# Patient Record
Sex: Female | Born: 1965
Health system: Southern US, Community
[De-identification: ages and names within clinical notes are randomized; demographics above are authoritative.]

## PROBLEM LIST (undated history)

## (undated) DIAGNOSIS — K5010 Crohn's disease of large intestine without complications: Secondary | ICD-10-CM

## (undated) DIAGNOSIS — I10 Essential (primary) hypertension: Secondary | ICD-10-CM

## (undated) DIAGNOSIS — K219 Gastro-esophageal reflux disease without esophagitis: Secondary | ICD-10-CM

## (undated) DIAGNOSIS — M810 Age-related osteoporosis without current pathological fracture: Secondary | ICD-10-CM

## (undated) DIAGNOSIS — D126 Benign neoplasm of colon, unspecified: Secondary | ICD-10-CM

## (undated) DIAGNOSIS — M1990 Unspecified osteoarthritis, unspecified site: Secondary | ICD-10-CM

## (undated) DIAGNOSIS — D649 Anemia, unspecified: Secondary | ICD-10-CM

## (undated) DIAGNOSIS — D869 Sarcoidosis, unspecified: Secondary | ICD-10-CM

## (undated) DIAGNOSIS — K753 Granulomatous hepatitis, not elsewhere classified: Secondary | ICD-10-CM

## (undated) DIAGNOSIS — Z86010 Personal history of colonic polyps: Secondary | ICD-10-CM

## (undated) DIAGNOSIS — K589 Irritable bowel syndrome without diarrhea: Secondary | ICD-10-CM

## (undated) DIAGNOSIS — E663 Overweight: Secondary | ICD-10-CM

## (undated) HISTORY — PX: POLYPECTOMY: SHX149

## (undated) HISTORY — DX: Unspecified osteoarthritis, unspecified site: M19.90

## (undated) HISTORY — DX: Essential (primary) hypertension: I10

## (undated) HISTORY — DX: Age-related osteoporosis without current pathological fracture: M81.0

## (undated) HISTORY — PX: COLONOSCOPY: SHX174

## (undated) HISTORY — DX: Benign neoplasm of colon, unspecified: D12.6

## (undated) HISTORY — PX: KNEE SURGERY: SHX244

## (undated) HISTORY — DX: Anemia, unspecified: D64.9

## (undated) HISTORY — DX: Gastro-esophageal reflux disease without esophagitis: K21.9

## (undated) HISTORY — DX: Overweight: E66.3

## (undated) HISTORY — DX: Granulomatous hepatitis, not elsewhere classified: K75.3

## (undated) HISTORY — DX: Crohn's disease of large intestine without complications: K50.10

## (undated) HISTORY — DX: Personal history of colonic polyps: Z86.010

## (undated) HISTORY — DX: Irritable bowel syndrome, unspecified: K58.9

---
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
## 1998-06-27 HISTORY — PX: OTHER SURGICAL HISTORY: SHX169

## 2006-10-13 HISTORY — PX: SPINE SURGERY: SHX786

## 2007-01-22 ENCOUNTER — Ambulatory Visit (HOSPITAL_BASED_OUTPATIENT_CLINIC_OR_DEPARTMENT_OTHER): Admission: RE | Admit: 2007-01-22 | Discharge: 2007-01-22 | Payer: Self-pay | Admitting: Orthopedic Surgery

## 2007-05-29 ENCOUNTER — Encounter: Admission: RE | Admit: 2007-05-29 | Discharge: 2007-05-29 | Payer: Self-pay | Admitting: Orthopedic Surgery

## 2007-07-04 ENCOUNTER — Encounter (INDEPENDENT_AMBULATORY_CARE_PROVIDER_SITE_OTHER): Payer: Self-pay | Admitting: Orthopedic Surgery

## 2007-07-04 ENCOUNTER — Inpatient Hospital Stay (HOSPITAL_COMMUNITY): Admission: RE | Admit: 2007-07-04 | Discharge: 2007-07-10 | Payer: Self-pay | Admitting: Orthopedic Surgery

## 2007-10-29 ENCOUNTER — Ambulatory Visit (HOSPITAL_BASED_OUTPATIENT_CLINIC_OR_DEPARTMENT_OTHER): Admission: RE | Admit: 2007-10-29 | Discharge: 2007-10-29 | Payer: Self-pay | Admitting: Orthopedic Surgery

## 2007-12-22 ENCOUNTER — Encounter: Admission: RE | Admit: 2007-12-22 | Discharge: 2007-12-22 | Payer: Self-pay | Admitting: Neurology

## 2008-08-14 ENCOUNTER — Encounter (INDEPENDENT_AMBULATORY_CARE_PROVIDER_SITE_OTHER): Payer: Self-pay | Admitting: Obstetrics and Gynecology

## 2008-08-14 ENCOUNTER — Ambulatory Visit (HOSPITAL_COMMUNITY): Admission: RE | Admit: 2008-08-14 | Discharge: 2008-08-15 | Payer: Self-pay | Admitting: Obstetrics and Gynecology

## 2009-03-15 IMAGING — CR DG CHEST 2V
2 series · 2 of 2 positions shown · non-contrast
Comparison: None.

CLINICAL DATA: Preop for lumbar spondylosis.
 CHEST - 2 VIEW:

[view not recorded (1 of 2)]
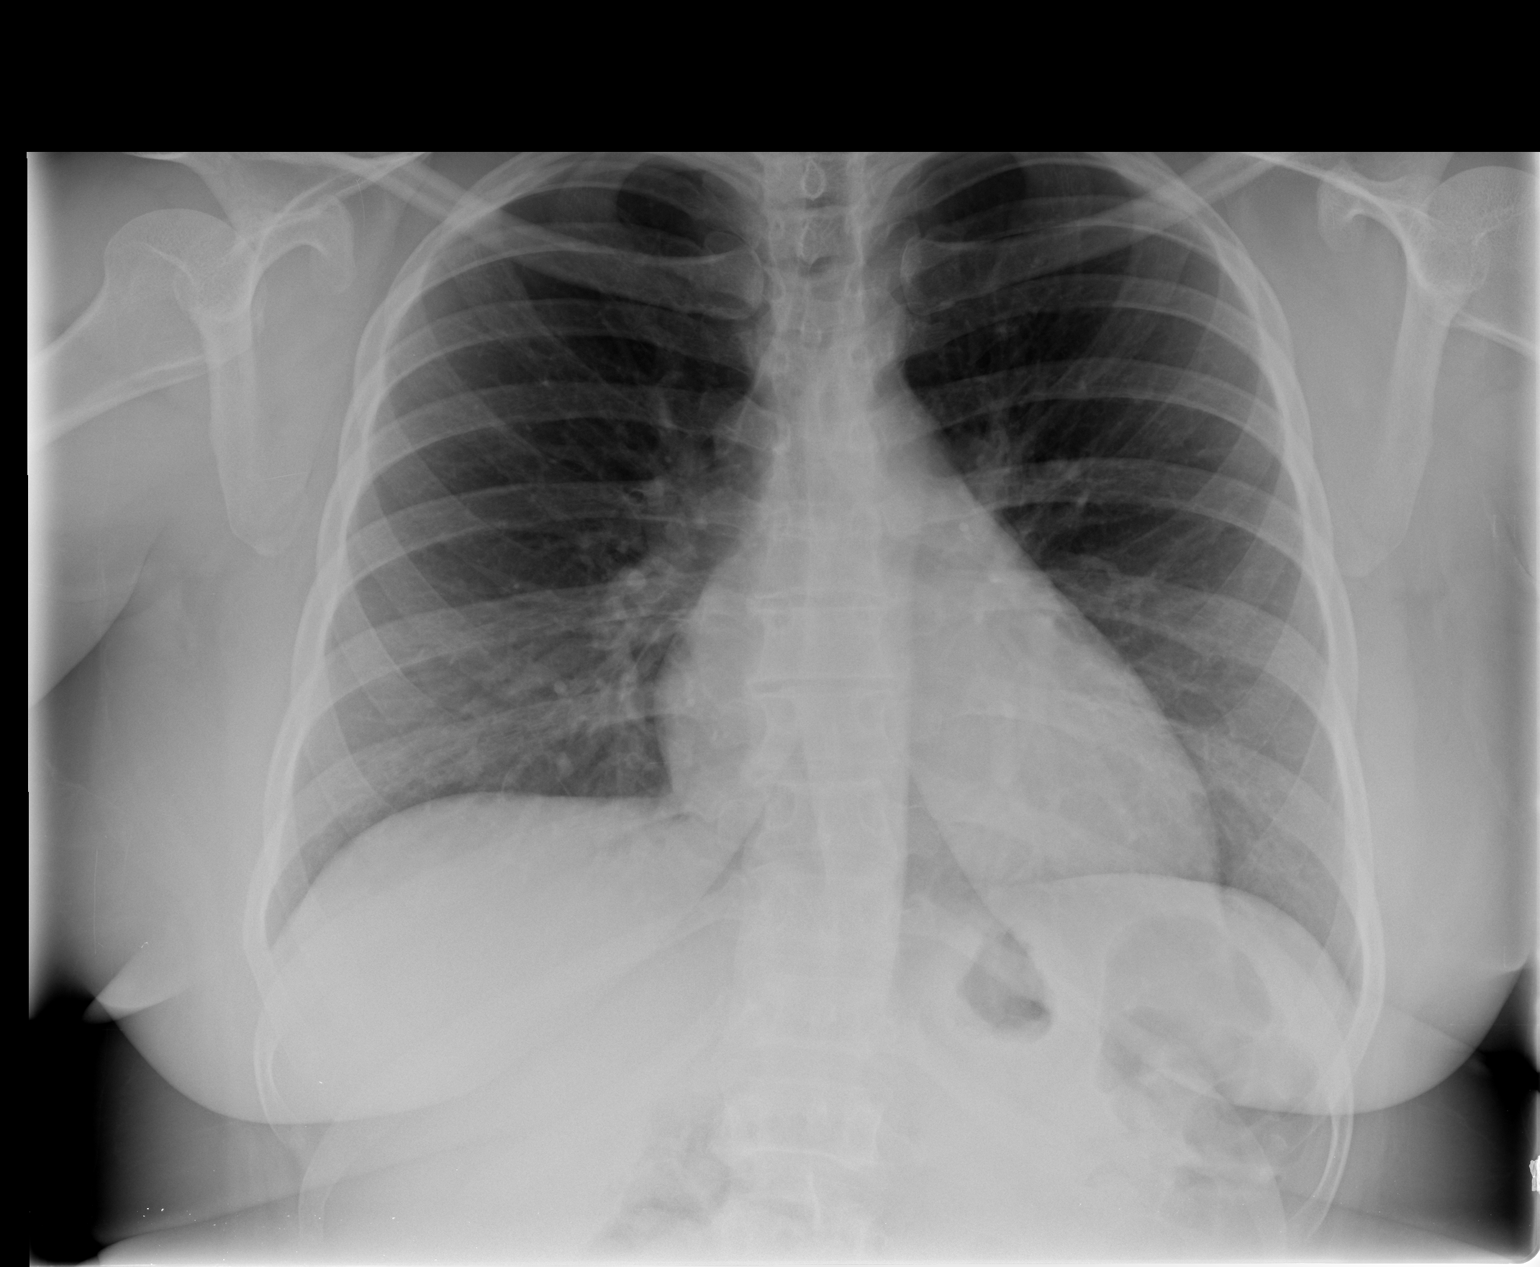

[view not recorded (2 of 2)]
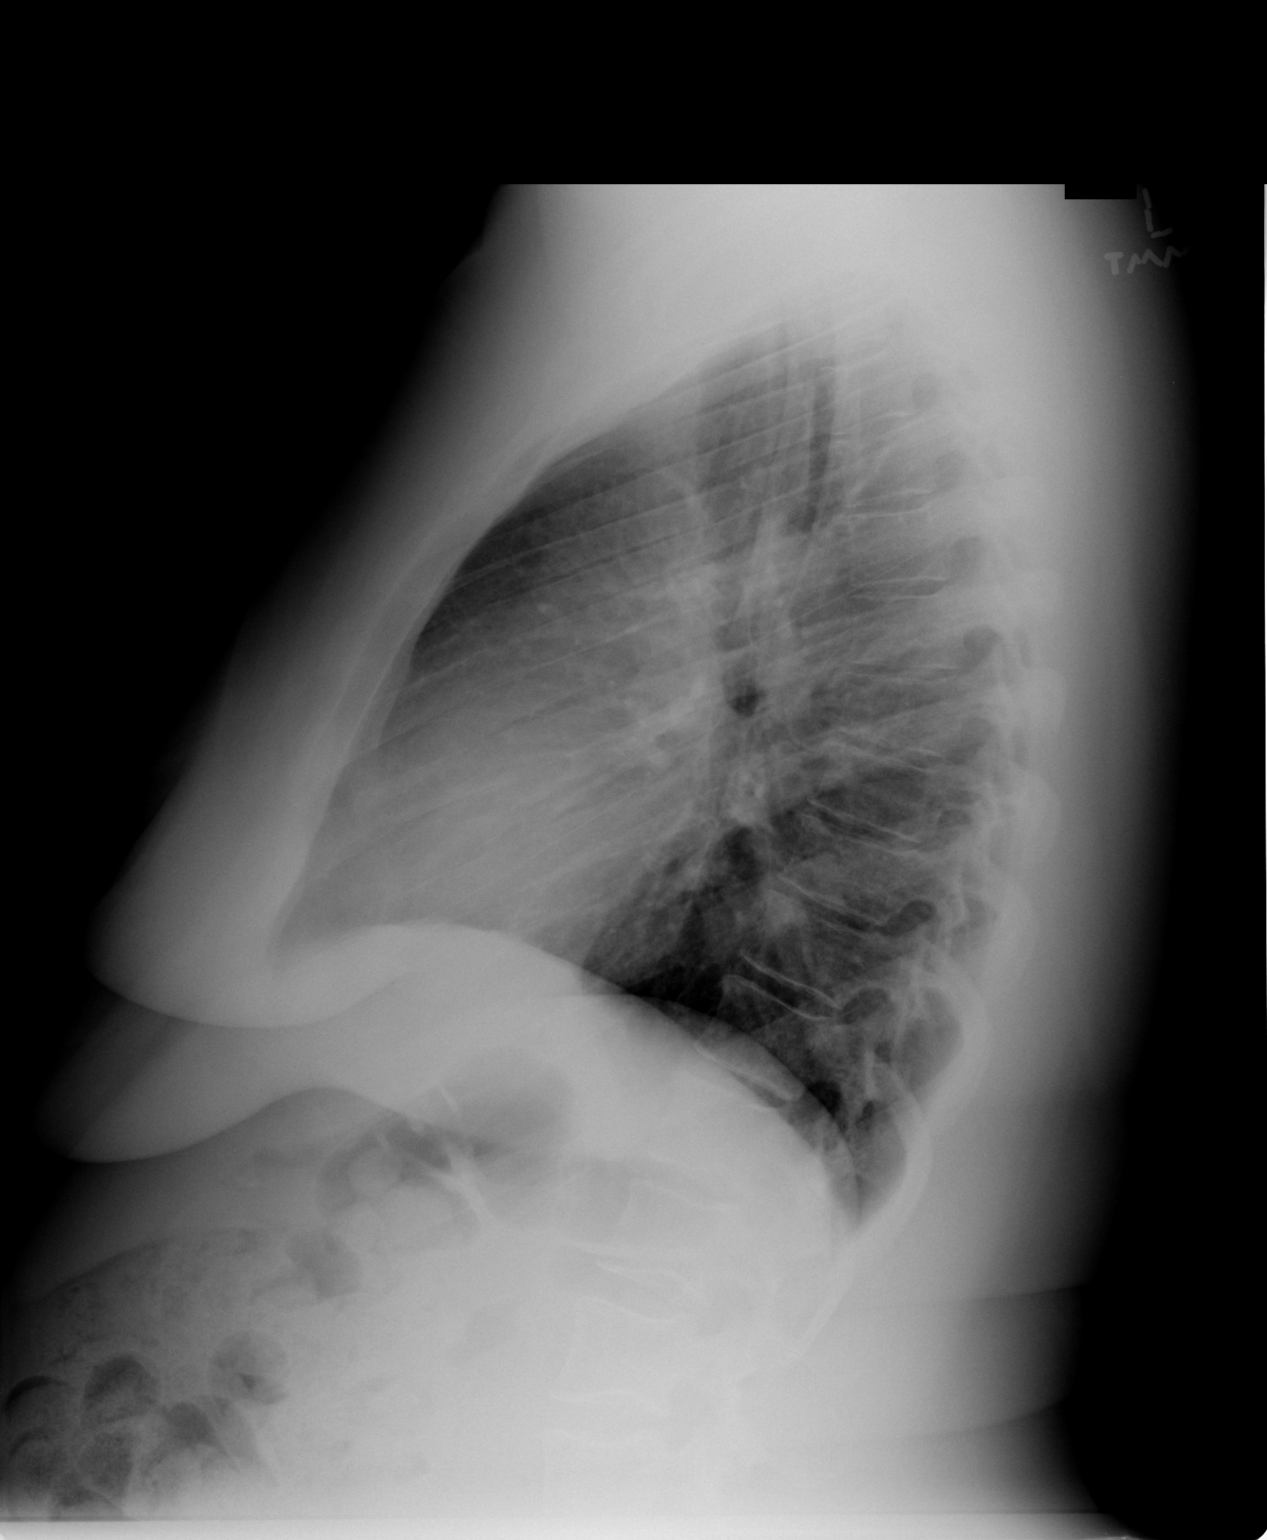

[2 of 2 positions shown; findings below may reference images not displayed]

FINDINGS: Heart size and mediastinal contours are normal. 
 There is no pleural fluid or pulmonary edema.
 No airspace opacities are identified.
IMPRESSION: No active disease.

## 2009-06-27 HISTORY — PX: WRIST FRACTURE SURGERY: SHX121

## 2009-06-27 LAB — HM PAP SMEAR: HM Pap smear: NORMAL

## 2009-11-05 ENCOUNTER — Encounter: Admission: RE | Admit: 2009-11-05 | Discharge: 2009-11-05 | Payer: Self-pay | Admitting: Specialist

## 2010-04-28 ENCOUNTER — Ambulatory Visit (HOSPITAL_BASED_OUTPATIENT_CLINIC_OR_DEPARTMENT_OTHER): Admission: RE | Admit: 2010-04-28 | Discharge: 2010-04-28 | Payer: Self-pay | Admitting: Orthopedic Surgery

## 2010-08-16 NOTE — Op Note (Signed)
NAMEJANSEN, [name]               ACCOUNT NO.:  [id number]  MEDICAL RECORD NO.:  [id number]          PATIENT TYPE:  AMB  LOCATION:  DSC                          FACILITY:  [facility]  PHYSICIAN:  Cindee Salt, M.D.       DATE OF BIRTH:  24-Mar-1966  DATE OF PROCEDURE:  04/28/2010 DATE OF DISCHARGE:                              OPERATIVE REPORT   PREOPERATIVE DIAGNOSIS:  Ulnocarpal abutment, left wrist.  POSTOPERATIVE DIAGNOSIS:  Ulnocarpal abutment, left wrist.  OPERATION:  Arthroscopy, debridement of LT tear with ulnar shortening osteotomy, left wrist.  SURGEON:  Cindee Salt, MD  ASSISTANTCarolyne Fiscal, RN  ANESTHESIA:  Axillary general.  ANESTHESIOLOGIST:  Janetta Hora. Gelene Mink, MD  HISTORY:  The patient is a 46 year old female with a long history of ulnar-sided wrist pain secondary to fall.  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  This has settle down to a positive MRI with changes on the ulnar aspect of her lunate only and LT tear.  TFCC is intact, but the x-rays and MRI findings are very consistent with ulnocarpal abutment.  She has elected to undergo ulnar shortening osteotomy after wrist arthroscopy.  Pre, peri and postoperative course have been discussed along with risks and complications.  She is aware that there is no guarantee with the surgery, possibility of infection, recurrence of injury to arteries, nerves, tendons, incomplete relief of symptoms, and dystrophy.  In the preoperative area, the patient is seen, the extremity marked by both the patient and surgeon, and antibiotic given.  PROCEDURE:  The patient was brought to the operating room where an axillary block was carried out without difficulty along with general anesthetic under the direction of Dr. Gelene Mink.  She was prepped using DuraPrep, supine position, left arm free.  A 3-minute dry time was allowed.  Time-out taken confirming the patient and procedure.   The patient's limb was placed in the concepts arthroscopy tower, 10 pounds of traction applied.  The joint inflated to 3-4 portal.  A transverse incision was made, deepened with hemostat.  Blunt trocar was used to enter the joint.  The joint was inspected.  The volar radial wrist ligaments were intact.  The scapholunate ligament was intact.  There were no significant articular changes.  The TFCC was noted, this had a very significant area of thinning in the central aspect, but was left intact.  The ulnar head was visible through the thinned TFCC.  A large lunotriquetral ligament injury was noted.  An irrigation catheter was placed in the 6U.  A 4-5 portal opened, the LT injury was probed.  This was then inspected through the 4-5 portal, found to be substantial in nature.  The midcarpal joint was then inspected through the radial midcarpal portal after localization with a 22-gauge needle.  Transverse incision was made, deepened with hemostat.  Blunt trocar used to enter the joint.  The joint was inspected.  STT joint showed no changes.  A type 2 lunate was present with some erosive changes, which were very minimal on the proximal hamate.  Lunotriquetral joint showed instability being present.  There  was no instability of scapholunate ligament complex.  The scope was reintroduced into the proximal 3-4 portal.  A debridement of the LT joint ligament was then performed with a full radius shaver.  An Merton Border wand was used for hemostasis.  The TFCC was left intact.  The arthroscopy instruments were removed.  The limb was exsanguinated with an Esmarch bandage.  Tourniquet was placed high and the arm was inflated to 250 mmHg.  An incision was then made to the ulnar aspect of the wrist, carried down through subcutaneous tissue. Dorsal sensory branch of the ulnar nerve identified and protected.  The dissection was then carried between the extensor digiti quinti and extensor carpi ulnaris down to the  ulna.  Periosteum was incised.  This was then elevated.  A seven-hole 2.4 modular plate was then selected. This was placed distally and attached with three 12-mm screws.  The rotation was marked.  Area for osteotomy was marked.  This was marked as a step-cut osteotomy, to be converted to a single hole crossing screw with three screws being placed proximally.  The cuts were made with an oscillating saw taking care not to burn the bone, 2-mm stacked saw blades were placed and the osteotomy performed in a stepped manner.  The plate was reapproximated.  The osteotomy was completed.  The plate reapplied distally.  The bone clamped under compression.  The three proximal screws were placed, these measured 11, 12, and 12 mm.  A 10-mm screw was then passed across the longitudinal portion of the osteotomy, firmly compressing this.  Trinity bone was soaked after irrigation. This was then packed into the osteotomy site as much as possible and around the osteotomy site.  The periosteum was then closed with figure- of-eight 4-0 Vicryl sutures, the fascia closed with interrupted 4-0 Vicryl, the subcutaneous tissue with interrupted 4-0 Vicryl and the skin with a subcuticular 4-0 Vicryl Rapide suture.  The portals for the arthroscopy were closed with interrupted 4-0 Vicryl Rapide.  A sterile compressive dressing, long-arm splint was then applied.  Full pronation and supination was afforded.  X-rays revealed that the osteotomy firmly decompressed the distal radiocarpal joint, fraction ulnocarpal joint with good pronation and supination on physical exam to complete 80 degrees in each direction.  The screws and plate lied in good position. On deflation of the tourniquet, all fingers were immediately pinked after application of the long-arm splint and she was taken to the recovery room for observation in satisfactory condition.  She will be admitted for overnight stay, discharged if comfortable.  She will  return in 1 week.  She is given a prescription for Vicodin #40 with no refills.          ______________________________ Cindee Salt, M.D.     GK/MEDQ  D:  04/28/2010  T:  04/29/2010  Job:  [id number]  Electronically Signed by Cindee Salt M.D. on 08/16/2010 12:20:29 PM

## 2010-09-07 LAB — POCT HEMOGLOBIN-HEMACUE: Hemoglobin: 10.4 g/dL — ABNORMAL LOW (ref 12.0–15.0)

## 2010-10-12 LAB — CBC
HCT: 35.3 % — ABNORMAL LOW (ref 36.0–46.0)
Hemoglobin: 11.8 g/dL — ABNORMAL LOW (ref 12.0–15.0)
MCV: 88.3 fL (ref 78.0–100.0)
Platelets: 375 10*3/uL (ref 150–400)
Platelets: 452 10*3/uL — ABNORMAL HIGH (ref 150–400)
RBC: 3.46 MIL/uL — ABNORMAL LOW (ref 3.87–5.11)
RBC: 4 MIL/uL (ref 3.87–5.11)
RDW: 13 % (ref 11.5–15.5)
WBC: 8.4 10*3/uL (ref 4.0–10.5)

## 2010-10-12 LAB — PREGNANCY, URINE: Preg Test, Ur: NEGATIVE

## 2010-11-09 NOTE — Discharge Summary (Signed)
NAMECALISTA, [name]               ACCOUNT NO.:  [id number]   MEDICAL RECORD NO.:  [id number]          PATIENT TYPE:  INP   LOCATION:  5020                         FACILITY:  [facility]   PHYSICIAN:  Lianne Cure, P.A.  DATE OF BIRTH:  1966-05-09   DATE OF ADMISSION:  07/04/2007  DATE OF DISCHARGE:                               DISCHARGE SUMMARY   PREOPERATIVE DIAGNOSIS:  Lumbar disk degeneration, disk disruption L5-  S1, grade 1 isthmic spondylolisthesis L5-S1, annular tear L4-5 disk.   Postoperatively she was admitted to [facility], Room 5020.  Minimal blood loss. __________ Postoperative no complications.  Postop  day 1 the patient was having drainage output of 250 mL, urine output was  450.  White count 13.5, hemoglobin 10.1, no shortness of breath and no  chest pain.  Dressing was clean, dry, and intact.  Physical therapy came  for evaluation for mobility.  Postop day 2 T-max was 100.3, heart rate  115, and blood pressure of 115/69.  Some right thigh pain, decreased  mobility, generalized body weakness, and pain, but stable otherwise.  Postop day 3; white count was 12.4, hemoglobin 9.3.  Potassium 3.1, T-  max 100.5.  Still poor mobility.  Discontinued the IV fluids slowly and  PCA.  Ordered 3 bananas a day for low potassium.  Postop day 4  hemoglobin stable at 9.5, white count 11.7.  Afebrile and vital signs  were stable with increase in mobility with therapy assistance.  We did  order a LSO brace for the patient and today she reports that the brace  does help her feel more stable.  She walked independently to the  restroom as well as out in the hall.  She has a roller walker for home  and does have a 3 in 1 toilet seat at her home that was ordered.  She  has stable hemoglobin at 9.6, white count is 9.4.  She is afebrile and  vital signs are stable.  Wound is clean, dry, no dressing needed.   DISCHARGE DIAGNOSES:  Lumbar disk degeneration, disk disruption L5-S1  with  grade 1 isthmic spondylolisthesis at L5-S1, and annular tear at L4-  5 disk status post fusion.   PLAN:  No bending, lifting, stooping, squatting.  Sent her home on her  regular prescription medications and new medications to include Norco  10/325 one to 2 q.4 p.r.n. for pain control, count of 120 with 1 refill,  and Robaxin 500 mg 1-2 q.6 p.r.n. for muscle spasm, count 100 with a  refill, and she is going to follow up in our office in approximately 3  weeks.  She may shower.  No dressing needed.  We want her to continue to  eat potassium rich foods to include bananas, treat constipation with  over-the-counter products such as Colace, Senokot, and activity.  Increasing walking activity as tolerated.      Lianne Cure, P.A.     MC/MEDQ  D:  07/10/2007  T:  07/10/2007  Job:  [id number]

## 2010-11-09 NOTE — H&P (Signed)
[name], [name]               ACCOUNT NO.:  [id number]   MEDICAL RECORD NO.:  [id number]          PATIENT TYPE:  AMB   LOCATION:                                [facility]   PHYSICIAN:  Hal Morales, M.D.DATE OF BIRTH:  1966-01-13   DATE OF ADMISSION:  08/14/2008  DATE OF DISCHARGE:                              HISTORY & PHYSICAL   HISTORY OF PRESENT ILLNESS:  Ms. Buzan is a 45 year old separated black  female, para 2-0-1-2, presenting for a laparoscopically-assisted vaginal  hysterectomy because of menometrorrhagia, pelvic pain, and dysmenorrhea.  Over the past year, the patient's menstrual flow has lasted anywhere  from 8-39 days with the past 6 months being complicated by  intermenstrual bleeding.  During these episodes of flow, the patient  will change 6-10 tampons per day and endures severe cramping which she  rates as a 10/10 on a 10-point pain scale.  In spite of the use of  narcotics or nonsteroidal anti-inflammatory analgesia, the patient still  has significant pain.  More recently, the patient's bleeding occurs  without warning and she has the same menstrual-like cramps on an  almost daily basis.  This pain will increase with prolonged standing.  She further reports nocturia (5 episodes per night), postvoid urgency,  urge incontinence requiring her to wear 3 pads per day and dyspareunia.  She denies any fever, nausea, vomiting, diarrhea, or vaginitis symptoms.  In November 2009, the patient had a normal CSH and endometrial biopsy  and she also had a normal CBC.  A pelvic ultrasound in November 2009  revealed a uterus measuring 7.60 x 4.61 x 5.29-cm along with a left  ovary measuring 1.72 x 1.12 x 2.28-cm and a right ovary measuring 2.20 x  1.66 x 2.41-cm.  A review of both, medical and surgical management  options were given to the patient to include hormonal therapy, Mirena  IUD, hysteroscopy with D and C, and hysterectomy with the patient  deciding to proceed  with hysterectomy.   PAST MEDICAL HISTORY:   OB HISTORY:  Gravida 3, para 2-0-1-2.  The patient had spontaneous  vaginal births with her largest baby weighing 7 pounds 6 ounces.   GYN HISTORY:  Menarche, 45 years old.  Last menstrual period on August 06, 2008.  The patient has had tubal ligation for contraception.  Denies  any history of sexually transmitted diseases or abnormal Pap smears.  Last normal Pap smear was in November 2009.   MEDICAL HISTORY:  Migraine headaches, irritable bowel syndrome,  concussion, chronic back pain due to trauma, thyromegaly with benign  nodules, depression, and arthritis.   SURGICAL HISTORY:  In 1999, bilateral tubal ligation.  In 2008 and 2009,  right knee arthroscopy.  In 2009, back surgery fusion (lumbar).  She  denies any problems with anesthesia or history of blood transfusion.   FAMILY HISTORY:  Positive for asthma.   HABITS:  She does not use alcohol, tobacco, or illicit drugs.   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   CURRENT MEDICATIONS:  1. Robaxin 500 mg every 4 hours as needed.  2. Norco 7.5/325 one to two every 4-6 hours as needed.  3. Midrin as needed.  4. Naproxen 500 mg twice daily as needed.   The patient denies any drug allergies or sensitivities to shellfish or  latex.   REVIEW OF SYSTEMS:  The patient does wear glasses.  She admits to  nocturnal vasomotor symptoms, occasional neck spasms, fullness in her  neck causing her sometimes difficulty swallowing believed to be  secondary to her thyromegaly, acid reflux symptoms, but denies any chest  pain, shortness of breath, headache, vision changes, ringing in her  ears, skin rashes, swelling of joints, nausea, vomiting, diarrhea, and  except as is mentioned in history of present illness.  The patient's  review of systems is otherwise negative.   PHYSICAL EXAMINATION:  VITAL SIGNS:   Blood pressure 132/80, pulse is 78,  respirations 12, temperature 98.1 degrees Fahrenheit orally, weight 216  pounds, and height 5 feet 4 inches.  Body mass index 37.1.  NECK:  Supple.  There is no cervical adenopathy.  There is palpable  tenderness over an enlarged thyroid.  HEART:  Regular rate and rhythm.  LUNGS:  Clear.  BACK:  No CVA tenderness.  ABDOMEN:  Tender in lower quadrants, the right being greater than the  left lower quadrant.  However, there is no guarding, rebound or  organomegaly.  EXTREMITIES:  No clubbing, cyanosis or edema.  PELVIC:  EG, BUS is within normal limits.  Vagina is rugous.  Cervix is  nontender without lesions.  Uterus is normal size, shape, and  consistency without tenderness.  Adnexa, there is right adnexal  tenderness though there is no palpable mass.   IMPRESSION:  1. Menometrorrhagia.  2. Pelvic pain.  3. Dysmenorrhea.   DISPOSITION:  A discussion was held with the patient regarding the  indications for her procedure along with its risks which include but are  not limited to reaction to anesthesia, damage to adjacent organs,  infection, excessive bleeding, the possibility that an open abdominal  incision may be necessary to complete her surgery, that she may have to  have one or both of her ovaries removed should they appear diseased and  that this surgery may not relieve her pelvic pain.  The patient  verbalized understanding of these risk and has consented to proceed with  a laparoscopically-assisted vaginal hysterectomy with the possibility of  a total abdominal hysterectomy and the possibility of a unilateral  salpingo-oophorectomy at [facility] on August 14, 2008 at 10:30 a.m.  The patient was given a MiraLax bowel prep to be  completed 24 hours prior to her surgical procedure.      Elmira J. Adline Peals.      Hal Morales, M.D.  Electronically Signed   EJP/MEDQ  D:  08/08/2008  T:  08/09/2008  Job:   [id number]

## 2010-11-09 NOTE — Op Note (Signed)
NAMESELIA, [name]               ACCOUNT NO.:  [id number]   MEDICAL RECORD NO.:  [id number]          PATIENT TYPE:  AMB   LOCATION:  SDC                           FACILITY:  [facility]   PHYSICIAN:  Hal Morales, M.D.DATE OF BIRTH:  May 10, 1966   DATE OF PROCEDURE:  08/14/2008  DATE OF DISCHARGE:                               OPERATIVE REPORT   PREOPERATIVE DIAGNOSES:  1. Menorrhagia.  2. Dysmenorrhea.  3. Pelvic pain.   POSTOPERATIVE DIAGNOSES:  1. Menorrhagia.  2. Dysmenorrhea.  3. Pelvic pain.   PROCEDURE:  Laparoscopy and total vaginal hysterectomy.   SURGEON:  Vanessa P. Pennie Rushing, MD   FIRST ASSISTANT:  Janine Limbo, MD   ANESTHESIA:  General orotracheal.   FINDINGS:  The uterus was normal-sized.  The ovaries appeared normal  bilaterally.  There were no significant adhesions or stigmata of  endometriosis.  The tubes were status post interruption with fallopian  rings for tubal sterilization.   PROCEDURE:  The patient was taken to the operating room after  appropriate identification and placed on the operating table.  After the  attainment of adequate general anesthesia, she was placed in the  modified lithotomy position.  The abdomen, perineum, and vagina were  prepped with multiple layers of Betadine.  A Foley catheter was inserted  into the bladder and connected to straight drainage.  A Hulka tenaculum  was placed on the anterior cervix.  The abdomen and perineum were draped  as a sterile field.  Subumbilical and suprapubic injection of 0.25%  Marcaine for total of 10 mL was undertaken.  A subumbilical incision was  made and a Veress cannula placed through that incision into the  peritoneal cavity.  A pneumoperitoneum was created with 4 liters of CO2.  The Veress cannula was removed and laparoscopic trocar placed through  that incision into the peritoneal cavity.  The laparoscope was placed  through the trocar sleeve.  A suprapubic incision was made to the  left  of midline and the laparoscopic probe trocar placed through that  incision into the peritoneal cavity under direct visualization.  The  above-noted findings were made and in the absence of any specific pelvic  pathology that would preclude a total vaginal hysterectomy, the  laparoscopy was ended and the perineum became the site of operation.  A  weighted speculum was placed in the posterior vagina.  Lahey tenaculum  was placed on the anterior and posterior surfaces of the cervix.  The  cervicovaginal mucosa was injected with a dilute solution of Pitressin  for total of 40 mL.  The cervix was circumscribed.  The anterior vaginal  mucosa was dissected off the anterior cervix.  The anterior peritoneum  was entered and the bladder blade placed.  The posterior peritoneum was  entered sharply and tagged.  The uterosacral ligaments on the right and  left side were clamped, cut, and suture ligated and those sutures held.  The paracervical tissues were clamped, cut, and suture ligated.  The  uterine arteries and parametrial tissues were successively clamped, cut,  and suture ligated.  The uterus was inverted  and the upper pedicle  clamped, cut, and suture ligated doubly and those sutures held.  The  uterus and cervix were removed from the operative field.  McCall  culdoplasty suture was placed in the posterior cul-de-sac incorporating  the 2 uterosacral ligaments and the intervening posterior peritoneum.  Hemostatic sutures were placed on the right and left side of the vaginal  cuff with adequate hemostasis.  The vaginal angles were then created  with the uterosacral sutures placed and into the anterior vaginal mucosa  and posterior vaginal mucosa and the angle tied down.  The remainder of  the vaginal cuff was closed in a single layer incorporating the anterior  vaginal mucosa, anterior peritoneum, posterior peritoneum, and posterior  vaginal mucosa in each figure-of-eight suture.   Hemostasis was noted to  be adequate and the [facility] culdoplasty suture was tied down.  All  sutures were 0 Vicryl.  The hemostasis was noted to be adequate and the  vagina was packed with a 2-inch plain gauze moistened with Estrace  cream.  The surgeon then changed gloves and closed the subumbilical  incision with a fascial stitch of 0 Vicryl in a figure-of-eight fashion  and a subcuticular skin stitch with 3-0 Vicryl.  The suprapubic incision  was closed with a subcuticular suture of 3-0 Vicryl.  Estimated blood  loss was 200 mL.  The incisions were covered with sterile dressing and  the patient awakened from general anesthesia and taken to the recovery  room in satisfactory condition having tolerated the procedure well with  sponge and instrument counts correct.   SPECIMENS TO PATHOLOGY:  Uterus and cervix.      Hal Morales, M.D.  Electronically Signed     VPH/MEDQ  D:  08/14/2008  T:  08/15/2008  Job:  [id number]

## 2010-11-09 NOTE — Op Note (Signed)
[name], [name]               ACCOUNT NO.:  [id number]   MEDICAL RECORD NO.:  [id number]          PATIENT TYPE:  AMB   LOCATION:  DSC                          FACILITY:  [facility]   PHYSICIAN:  Feliberto Gottron. Turner Daniels, M.D.   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   PROCEDURE:  Right knee arthroscopic partial lateral meniscectomy,  anterior horn, debridement of chondromalacia from the patella, grade 3,  with flap tears from the medial femoral condyle distally, grade 3 with  flap tears along a 5-mm stripe x 2-cm stripe and also near the notch.   SURGEON:  Feliberto Gottron.  Turner Daniels, MD   ASSISTANT:  Skip Mayer, PA-C   ANESTHETIC:  General LMA.   ESTIMATED BLOOD LOSS:  Minimal.   FLUID REPLACEMENT:  800 mL of crystalloid.   DRAINS PLACED:  None.   TOURNIQUET TIME:  None.   INDICATIONS FOR PROCEDURE:  [age]-year-old woman injured in a fall  at work some months ago, who got a good initial response to a cortisone  injection for about 3 hours and then her pain returned.  MRI scan was  nondiagnostic.  Because of persistent pain, catching and popping, she is  taken for arthroscopic evaluation and treatment of her right knee, which  has not gotten better over many months of observation, anti-inflammatory  medicines and exercises.  Risks and benefits of surgery were discussed  and questions answered.   DESCRIPTION OF PROCEDURE:  The patient was identified by armband and  taken to the operating room at [facility].  Appropriate  site monitors were attached and general LMA anesthesia induced with the  patient in the supine position.  Lateral post was applied to the  table  and the right lower extremity prepped and draped in the usual sterile  fashion from the ankle to the midthigh.  We began the procedure by  making standard inferomedial and inferolateral peripatellar portals.  The arthroscope was introduced in the inferolateral portal, the outflow  through the inferomedial portal.  Diagnostic arthroscopy revealed grade  3 chondromalacia of the apex of the patella with flap tears and this was  debrided back to a stable margin with a 3.5 Gator sucker shaver, as was  another impressive stripe of chondromalacia of the medial femoral  condyle distally, 5 mm by about 2 cm in length with peripheral flap  tears as well.  The patient also had some chondromalacia near the notch  on the medial femoral condyle; this was debrided.  Moving to the lateral  side, the lateral meniscus had a fairly impressive anterior horn tear  and this was debrided back to a stable margin with a 3.5 Gator sucker  shaver from the mid-lateral lateral meniscus to the anterior.  At this  point,  the  knee was then irrigated out with normal saline solution, the  arthroscopic instruments removed and a dressing of Xeroform, 4 x 4  dressing sponges, Webril and Ace wrap applied.  Please note that the  cruciate ligaments were probed and found to be intact.      Feliberto Gottron. Turner Daniels, M.D.  Electronically Signed     FJR/MEDQ  D:  01/22/2007  T:  01/23/2007  Job:  [id number]   cc:   Mariea Clonts. [place] Scientist, forensic

## 2010-11-09 NOTE — Op Note (Signed)
[name], [name]               ACCOUNT NO.:  [id number]   MEDICAL RECORD NO.:  [id number]          PATIENT TYPE:  AMB   LOCATION:  DSC                          FACILITY:  [facility]   PHYSICIAN:  Feliberto Gottron. Turner Daniels, M.D.   DATE OF BIRTH:  31-Oct-1965   DATE OF PROCEDURE:  10/29/2007  DATE OF DISCHARGE:                               OPERATIVE REPORT   PREOPERATIVE DIAGNOSES:  1. Right knee chondromalacia medial femoral condyle.  2. Possible meniscal tear.   POSTOPERATIVE DIAGNOSES:  1. Right knee chondromalacia medial femoral condyle grade 3 with flap      tears.  2. Complex tearing of the lateral meniscus posterior and medial horns.   PROCEDURE:  Right knee arthroscopic partial lateral meniscectomy and  debridement of chondromalacia medial femoral condyle grade 3.   SURGEON:  Feliberto Gottron.  Turner Daniels, MD   FIRST ASSISTANT:  Shirl Harris PA-C.   ANESTHETIC:  General LMA.   ESTIMATED BLOOD LOSS:  Minimal.   FLUID REPLACEMENT:  800 mL crystalloid.   DRAINS PLACED:  None.   TOURNIQUET TIME:  None.   INDICATIONS FOR PROCEDURE:  The patient is a 45 year old woman with  catching, popping and pain in her right knee, who has had arthroscopy  the day before and has mechanical symptoms consistent with a  chondromalacia or meniscal tear.  MRI scan only showed some  chondromalacia and possibly some fraying of the lateral meniscus.  Cortisone injection only gave her 50% pain relief but she is desperate  to get some relief and desires elective arthroscopic evaluation and  treatment of her right knee.  Risks and benefits of the surgery  discussed and questions answered.   DESCRIPTION OF PROCEDURE:  The patient identified by armband and taken  to the operating room at [facility].  Appropriate  anesthetic monitors were attached and general LMA anesthesia induced  with the patient in the supine position.  Lateral post was applied to  the table and the right lower extremity prepped  and draped in the usual  sterile fashion from the ankle to the mid thigh.  We began the procedure  involving the standard inferomedial and inferolateral peripatellar  portals after the appropriate time-out procedure was performed and it  should be noted she did receive Ancef 1 g preoperatively.  The  arthroscope was inserted through the inferolateral portal.  The outflow  through the inferomedial portal pump pressure was set at 60 mm and  diagnostic arthroscopy revealed a normal suprapatellar pouch and  trochlea.  The patella had evidence of previous debridement grade 2 to  grade 3 chondromalacia with no new flap tears.  The medial femoral  condyle did have a 1 cm x 5-mm stripe of grade 3 chondromalacia with  flap tears that did require debridement may have been a pain generator.  The medial meniscus, medial tibial plateau and cruciate ligaments were  all intact.  Going to the lateral side, the lateral meniscus had some  complex tearing that was debrided back to a stable margin with large and  small straight biters.  At this point, the  knee was irrigated out with  normal saline solution and the arthroscopic instruments were removed  after first clearing the gutters medially and laterally.  A dressing of  Xeroform, 4x4 dressing, sponges, Webril and Ace wrap was then applied.  The patient was awakened and taken to the recovery room without  difficulty.      Feliberto Gottron. Turner Daniels, M.D.  Electronically Signed     FJR/MEDQ  D:  10/29/2007  T:  10/30/2007  Job:  [id number]

## 2010-11-09 NOTE — Op Note (Signed)
NAMECORINE, SOLORIO NO.:  [id number]   MEDICAL RECORD NO.:  [id number]          PATIENT TYPE:  INP   LOCATION:  2550                         FACILITY:  [facility]   PHYSICIAN:  Nelda Severe, MD      DATE OF BIRTH:  1965-07-26   DATE OF PROCEDURE:  07/04/2007  DATE OF DISCHARGE:                               OPERATIVE REPORT   SURGEON:  Dr. Nelda Severe.   ASSISTANT:  Lianne Cure, PA-C   PREOPERATIVE DIAGNOSIS:  Lumbar disk degeneration with disk disruption,  L5-S1; grade 1 isthmic spondylolisthesis, L5-S1; annular tear, L4-5  disk.   POSTOPERATIVE DIAGNOSIS:  Lumbar disk degeneration with disk disruption,  L5-S1; grade 1 isthmic spondylolisthesis, L5-S1; annular tear, L4-5  disk.   OPERATIVE PROCEDURE:  Posterior interbody fusion (PLIF) L5-S1,  posterolateral fusion L4-5, L5-S1; insertion pedicle screws L4-5, S1  bilaterally; insertion of interbody fusion cage at L5-S1; harvest iliac  crest graft, right posterior iliac crest.   INDICATIONS FOR SURGERY:  Chronic severe disabling low back and right  leg pain.   The patient was brought to the operating room.  General endotracheal  anesthesia was induced.  Foley catheter was placed in the bladder.  Sequential compression devices were placed on both lower extremities..  Intravenous antibiotics had been administered prophylactically.  She was  positioned prone on the [place] frame.  Care was taken to position the  upper extremities to avoid hyperflexion and abduction of the shoulders  and so as to avoid hyperflexion of the elbows.  The hips were mildly  flexed and the thighs, knees, shins and ankles supported on pillows.  The site of the proposed midline lower lumbar incision was marked with a  skin marker on the skin as well as the right posterior iliac crest graft  harvest incision.  The lumbar area was prepped with DuraPrep and draped  in rectangular fashion.  The drapes were secured with Ioban.   The  skin was scored in the midline in vertical incision.  The  subcutaneous tissue and paraspinal muscles were then injected with a  mixture of 0.25% plain Marcaine and 1% lidocaine with epinephrine.  Incision was carried down onto the spinous processes of the lower lumbar  spine and the loose neural arch of L5 easily identified.  A cross-table  lateral radiograph was taken with the Kocher placed on the trailing edge  of what turned out to be the L3 vertebra and this conformed to our  impression of level.   Next we then furthered the exposure to reveal the transverse processes  of L4, L5 and ala of sacrum bilaterally.  We then proceeded to make  pedicle holes bilaterally at S1, L5, and L4.  This was done in the usual  fashion by identifying the base of superior articular process,  perforating the bone and create a hole through the pedicle into the  vertebral body using a pedicle probe and/or 3.5 mm drill bit.  Each hole  was carefully palpated to make sure it was circumferentially intact,  measured for depth, injected with FloSeal and a radiopaque marker  placed.  Cross-table lateral radiograph showed that the S1 markers were  rather distally placed and I decided to reorient the holes so as to  point more towards the sacral promontory.  This was done and the pedicle  markers reinserted.  I felt that the right L5 hole was unsatisfactory  and at this point then proceeded to remove the loose neural arch  completely at L5 and to extend the laminotomy proximally to the medial  aspect of the L5 pedicles so that I could drill a hole into the right L5  pedicle under direct observation of the nerve root while palpating the  medial pedicle with an instrument.  This hole was made accordingly,  injected with FloSeal and a radiopaque marker applied.   Cross-table lateral radiograph was again taken showing more satisfactory  position of markers.   In the meantime we had harvested a right posterior iliac  crest bone  graft through a separate incision made obliquely over the right  posterior iliac crest.  This was carried down through approximately 2-  1/2 inches of adipose tissue.  The gluteal fascia was detached from the  outer lip of the iliac crest and the muscle elevated off the outer table  of the ilium.  A deep Taylor retractor was placed.  We then harvested a  moderate quantity of bone graft using primarily an acetabular reamer.   The wound was then irrigated with saline, a piece of Gelfoam was used to  pack into the bony void and the gluteal fascia was reattached to iliac  crest using interrupted 3-0 with #1 Vicryl sutures in figure-of-eight  fashion.   Next we placed screws at S1, L5 and L4 on the right side.  Actually the  S1 hole was redrilled as the screw when it was inserted did not have a  very good purchase.  The medial S1 pedicle could be easily palpated  after I had removed the superior articular process on the right side at  S1 in preparation for the PLIF.  The hole was made under direct  visualization of S1 nerve root while palpating the medial and distal S1  pedicle.   Screws were then placed on the right side at S1, L5 and L4 and attached  to a pre contoured rod.  We then applied a rod reduction forceps and  reduced the grade 1 isthmic spondylolisthesis L5 on the right side.   Next we bipolar coagulated numerous epidural veins in the neural  foramina at L5-S1.  The exiting L5 nerve root could be clearly  visualized as was the S1 nerve root descending.  The medial dura was  retracted and the L5 nerve root retracted as necessary.  The annulus was  fenestrated and then the disk enucleated using a variety of curettes,  pituitary rongeurs, etc.  When I felt that the disk had been adequately  enucleated it was distracted up to 11 mm with an intradiskal distractor  and the set screws on the contour rod tightened with the L5-S1 space  reduced and distracted.   We then  introduced morcellized bone graft in the anterior disk space at  L5-S1 using a special funnel placed in the disk.   We then loaded a 11-mm kidney-shaped interbody cage with graft and  attempted to impacted in place.  This cage jammed and I could not fully  insert it.  It was removed and substituted with the 10 mm cage which fit  nicely and was impacted without difficulty.  We  then released the screws  to allow compression in lordosis of the construct at both L4-5 and L5-  S1, but without losing reduction of spondylolisthesis on the right side.   We next moved to the left side.  We decorticated with a high-speed bur  the ala of the sacrum, the transverse process of L5, the lateral aspect  of the superior articular process of S1 and the lateral aspect of the  superior articular process of L5 and L4 as well as the transverse  process of L4.  A moderately large quantity of graft was then packed in  posterolaterally from the lateral aspect of the L4 superior articular  process to the ala of the sacrum.  We then placed screws at S1, L5 and  L4.  Not mentioned above is the fact that on the right side all screws  were stimulated and thresholds were satisfactory levels for distal EMG  activation.  The same thing was done on the left side and once again the  current required to elicit distal EMG activity was in a safe zone.  We  then placed a pre contoured rod from S1 to L4 and again reduced the L5  screw onto the rod using a special reduction instrument.  The set screws  were placed at all levels.  Cross-table lateral rate radiograph was  taken which showed satisfactory reduction and placement of interbody  fusion cage as well as screws and rods.  The set screw couplings were  all torqued.   Next I used a high-speed bur to perform a thorough resection of the  articular surfaces of the L4-5 facets bilaterally and of the adjacent  lamina.  Bone graft was packed into these areas.   I then packed  the rest of the bone graft into the posterolateral area on  the left side.   We then injected 0.75 mg of preservative-free morphine (Astramorph)  through a 27-gauge needle introduced at the L3-4 interspace.  Spinal  fluid was withdrawn and then the Astramorph injected.   We then placed a 15 gauge Blake drain subfascially and secured to the  skin on the right side with a basket weave type of 2-0 nylon suture.  The thoracolumbar fascia was then closed using continuous #1 Vicryl  suture.  A one-eighth inch Hemovac drain was then brought through the  central subcutaneous layer out through the bone graft harvest site  through the skin on the right side.  It was secured also with 2-0 nylon  in ket weave fashion.  The central lumbar wound was closed in several  layers of interrupted and continuous #0 Vicryl because it was 2-1/2  inches thick.  Subcutaneous layer of the iliac crest wound was similarly  closed.  The skin of each wound was closed using subcuticular 3-0 undyed  running Vicryl suture.  Skin edges were reinforced with Steri-Strips and  triple antibiotic ointment dressing applied and secured with OpSite.   Blood loss estimated at 500 mL.  There were no intraoperative  complications although the surgery was quite arduous owing to the  patient's large size.  It should be mentioned that I actually had to  make a number stab wounds laterally with a 15blade on either side of the  incision order to introduce pedicle probe and/or drill bit angling it  sufficiently medially to be in the right trajectory for the pedicle.  This was because the wound was so deep the wound edges even at maximum  retraction were not  far enough lateral to get the right angle.   The sponge, needle counts were correct.   At time of dictation the patient is either currently being transferred  to recovery room or has just arrived and so no neurologic exam is  reported here.      Nelda Severe, MD   Electronically Signed     MT/MEDQ  D:  07/04/2007  T:  07/04/2007  Job:  [id number]

## 2010-11-12 NOTE — Discharge Summary (Signed)
NAMEVALREE, FEILD               ACCOUNT NO.:  [id number]   MEDICAL RECORD NO.:  [id number]          PATIENT TYPE:  OIB   LOCATION:  9315                          FACILITY:  [facility]   PHYSICIAN:  Hal Morales, M.D.DATE OF BIRTH:  03-28-1966   DATE OF ADMISSION:  08/14/2008  DATE OF DISCHARGE:  08/15/2008                               DISCHARGE SUMMARY   DISCHARGE DIAGNOSES:  1. Menorrhagia.  2. Dysmenorrhea.  3. Pelvic pain.  4. Uterine fibroids.   OPERATION:  On the date of admission, the patient underwent laparoscopy  followed by a total vaginal hysterectomy, tolerating procedure well.  The patient was found to have a uterus, which appeared normal size with  ovaries that appeared normal bilaterally.  There were no significant  adhesions or stigmata of endometriosis.  The patient's tubes were status  post interruption with a Falope-Ring for tubal sterilization.   HISTORY OF PRESENT ILLNESS:  Ms. Hallgren is a 45 year old, separated  black female, para 2-0-1-2, presenting for laparoscopy with a vaginal  hysterectomy because of menometrorrhagia, pelvic pain, and dysmenorrhea.  Please see the patient's dictated history and physical examinations for  details.   PREOPERATIVE PHYSICAL EXAMINATION:  VITAL SIGNS:  Blood pressure 132/80,  pulse was 78, respirations were 12, temperature was 98.1 degrees  Fahrenheit orally, weight was 216 pounds, height 5 feet 4 inches, and  body mass index was 37.1.  GENERAL  Within normal limits though noted.  NECK:  There was palpable tenderness over an enlarged thyroid.  ABDOMEN:  Tenderness in both lower quadrants with the right being  greater than the left; however, there was no guarding, rebound or  organomegaly.  PELVIC:  EGBUS was within normal limits.  Vagina was rugose.  Cervix was  nontender without lesions.  Uterus was normal size, shape, and  consistency without tenderness.  Adnexa revealed right adnexal  tenderness, though there were no  palpable masses.   HOSPITAL COURSE:  On the day of admission, the patient underwent  aforementioned procedures, tolerating them all well.  Postoperative  course was unremarkable with patient resuming bowel and bladder function  by postop day #1, tolerating a postop hemoglobin of 10.3 (preop  hemoglobin 11.8) and therefore deemed ready for discharge home.   DISCHARGE MEDICATIONS:  The patient was directed to her home medication  reconciliation form, which included narcotic pain medicines.  She also  was prescribed ibuprofen 600 mg q.6 h. with food for 5 days then as  needed.   FOLLOWUP:  The patient is to keep her 6 weeks postoperative appointment  with Dr. Pennie Rushing at [facility].   DISCHARGE INSTRUCTIONS:  The patient was given instructions at [facility] prior to her surgery.  She was further advised to avoid  driving for 2 weeks, heavy lifting for 6 weeks, intercourse after 6  weeks that she may shower.  She may walk up steps.  Her diet was without  restriction.  Wound care was not applicable.   FINAL PATHOLOGY:  Uterus hysterectomy:  Benign cervix with mild chronic  cervicitis, benign weakly secretory endometrium, and leiomyoma.  [place] J. Adline Peals.      Hal Morales, M.D.  Electronically Signed    EJP/MEDQ  D:  09/09/2008  T:  09/10/2008  Job:  [id number]

## 2011-02-14 ENCOUNTER — Ambulatory Visit (HOSPITAL_BASED_OUTPATIENT_CLINIC_OR_DEPARTMENT_OTHER)
Admission: RE | Admit: 2011-02-14 | Discharge: 2011-02-14 | Disposition: A | Discharge status: Home / Self Care | Payer: 59 | Source: Ambulatory Visit | Attending: Family | Admitting: Family

## 2011-02-14 ENCOUNTER — Encounter: Payer: Self-pay | Admitting: Family

## 2011-02-14 ENCOUNTER — Ambulatory Visit (INDEPENDENT_AMBULATORY_CARE_PROVIDER_SITE_OTHER): Payer: 59 | Admitting: Family

## 2011-02-14 VITALS — BP 110/70 | HR 78 | Temp 98.5°F | Resp 16 | Wt 218.1 lb

## 2011-02-14 DIAGNOSIS — M5090 Cervical disc disorder, unspecified, unspecified cervical region: Secondary | ICD-10-CM

## 2011-02-14 DIAGNOSIS — M25529 Pain in unspecified elbow: Secondary | ICD-10-CM | POA: Insufficient documentation

## 2011-02-14 DIAGNOSIS — M25522 Pain in left elbow: Secondary | ICD-10-CM | POA: Insufficient documentation

## 2011-02-14 DIAGNOSIS — M25629 Stiffness of unspecified elbow, not elsewhere classified: Secondary | ICD-10-CM | POA: Insufficient documentation

## 2011-02-14 DIAGNOSIS — M542 Cervicalgia: Secondary | ICD-10-CM

## 2011-02-14 DIAGNOSIS — E042 Nontoxic multinodular goiter: Secondary | ICD-10-CM | POA: Insufficient documentation

## 2011-02-14 DIAGNOSIS — Z87828 Personal history of other (healed) physical injury and trauma: Secondary | ICD-10-CM

## 2011-02-14 MED ORDER — MELOXICAM 7.5 MG PO TABS: 7.5000 mg | ORAL_TABLET | Freq: Every day | ORAL | Status: DC

## 2011-02-14 MED ORDER — CYCLOBENZAPRINE HCL 5 MG PO TABS: 5.0000 mg | ORAL_TABLET | Freq: Three times a day (TID) | ORAL | Status: AC | PRN

## 2011-02-14 NOTE — Assessment & Plan Note (Signed)
Patient had an MRI of the C Spine performed back in June 2009 which revealed- Baseline congenitally slightly narrowed spinal canal. Additionally, superimposed C5-6 small left posterior lateral disc protrusion with slight flattening of the adjacent cord.  Given worsening pain will repeat MRI of C-spine.  If abnormal will plan referral back to Dr. Alveda Reasons for further evaluation.  I have recommended short term use of meloxicam and flexeril (pt instructed not to drive while using flexeril).  I did review the [place] controlled substance registry and see that she is also taking vicodin which she has been filling regularly.  She did not disclose this to me.  She has had 5 different providers fill this medication in the last 6 months.
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
## 2011-02-14 NOTE — Patient Instructions (Signed)
Please complete your x-ray on the first floor today- we will contact you with the results. Myriam Jacobson will contact you about scheduling the MRI of you neck. Follow up in 1 month for a complete physical.  Come fasting to this appointment.  Welcome to [facility]!

## 2011-02-14 NOTE — Assessment & Plan Note (Signed)
Per report review, these have remained extremely small and stable over time.  Will plan to check TSH next visit at her physical.

## 2011-02-14 NOTE — Assessment & Plan Note (Signed)
Will order x-ray to exclude fracture.

## 2011-02-19 ENCOUNTER — Ambulatory Visit (HOSPITAL_BASED_OUTPATIENT_CLINIC_OR_DEPARTMENT_OTHER)
Admission: RE | Admit: 2011-02-19 | Discharge: 2011-02-19 | Disposition: A | Discharge status: Home / Self Care | Payer: 59 | Source: Ambulatory Visit | Attending: Family | Admitting: Family

## 2011-02-19 DIAGNOSIS — M25519 Pain in unspecified shoulder: Secondary | ICD-10-CM

## 2011-02-19 DIAGNOSIS — M47812 Spondylosis without myelopathy or radiculopathy, cervical region: Secondary | ICD-10-CM

## 2011-02-19 DIAGNOSIS — M549 Dorsalgia, unspecified: Secondary | ICD-10-CM | POA: Insufficient documentation

## 2011-02-19 DIAGNOSIS — R51 Headache: Secondary | ICD-10-CM | POA: Insufficient documentation

## 2011-02-19 DIAGNOSIS — M542 Cervicalgia: Secondary | ICD-10-CM | POA: Insufficient documentation

## 2011-02-19 DIAGNOSIS — M5090 Cervical disc disorder, unspecified, unspecified cervical region: Secondary | ICD-10-CM | POA: Insufficient documentation

## 2011-03-15 ENCOUNTER — Encounter: Payer: Self-pay | Admitting: Family

## 2011-03-15 ENCOUNTER — Ambulatory Visit (INDEPENDENT_AMBULATORY_CARE_PROVIDER_SITE_OTHER): Payer: 59 | Admitting: Family

## 2011-03-15 DIAGNOSIS — M7918 Myalgia, other site: Secondary | ICD-10-CM

## 2011-03-15 DIAGNOSIS — IMO0001 Reserved for inherently not codable concepts without codable children: Secondary | ICD-10-CM

## 2011-03-15 DIAGNOSIS — M5090 Cervical disc disorder, unspecified, unspecified cervical region: Secondary | ICD-10-CM | POA: Insufficient documentation

## 2011-03-15 NOTE — Progress Notes (Signed)
  Subjective:    Patient ID: [name], female    DOB: 07-24-1965, 45 y.o.   MRN: [id number]  HPI  [name] is a 45 yr old female who presents today with complaint of "stabbing pain in the right side of her chest."  Symptoms started last night.  Notes that the pain is made worse by raising the right arm, with cough, or with a deep breath.  She tried icy hot without improvement.    Review of Systems See HPI  Past Medical History  Diagnosis Date  . Arthritis   . Overweight     History   Social History  . Marital Status: Married    Spouse Name: N/A    Number of Children: N/A  . Years of Education: N/A   Occupational History  . disabled    Social History Main Topics  . Smoking status: Never Smoker   . Smokeless tobacco: Never Used  . Alcohol Use: No  . Drug Use: No  . Sexually Active: Not on file   Other Topics Concern  . Not on file   Social History Narrative   Regular exercise:  NoCaffeine Use:  Occasional sweet teaShe is legally separated2 children ages 60 year old son and 62 year old daughter.  Both children.Previously worked in Engineering geologist, now disabled.Completed 2 yrs of college.      Past Surgical History  Procedure Date  . Spine surgery 10/13/06    Posterior interbody fusion (PLIF) L5-S1 (07/04/07)  . Knee surgery      Right knee arthroscopic partial lateral meniscectomy (01/22/07),arthroscopic partial lateral meniscectomy 5/09  . Fracture surgery 2011    Due to injury at work in 2008.    Family History  Problem Relation Age of Onset  . Asthma Mother   . Cancer Mother     gastric tumor  . Glaucoma Mother   . Hyperlipidemia Mother   . Hypertension Maternal Aunt     No Known Allergies  Current Outpatient Prescriptions on File Prior to Visit  Medication Sig Dispense Refill  . meloxicam (MOBIC) 7.5 MG tablet Take 1 tablet (7.5 mg total) by mouth daily.  20 tablet  0    BP 140/84  Pulse 72  Temp(Src) 98 F (36.7 C) (Oral)  Resp 16  Wt 220 lb  (99.791 kg)  SpO2 100%  LMP 02/25/2009       Objective:   Physical Exam  Constitutional: She appears well-developed and well-nourished.  HENT:  Head: Normocephalic and atraumatic.  Cardiovascular: Normal rate and regular rhythm.   No murmur heard. Pulmonary/Chest: Breath sounds normal. She is in respiratory distress. She has no wheezes. She has no rales. She exhibits no tenderness.  Musculoskeletal:       + right anterior chest wall tenderness to palpation.  + tenderness to palpation of the right shoulder and pain with movement.            Assessment & Plan:

## 2011-03-15 NOTE — Assessment & Plan Note (Signed)
+   tenderness to palpation of chest wall.  I recommended Mobic.  If no improvement in pain/tenderness of right shoulder with Mobic, I told pt that this would be best referred back to her orthopedist.  She told me that Dr. Clarisse Gouge was prescribing her norco, but registry shows that this is coming from someone in Orthopedics, I questioned her about this and she acted very surprised changed her story re: prescriber.  I continue to remain concerned that she is drug seeking.

## 2011-03-15 NOTE — Assessment & Plan Note (Signed)
MRI performed last visit notes: Negative for post-traumatic deformity. Mild spondylosis of the cervical spine. Previously seen small left paracentral protrusion  at C5-6.  She continues to have neck pain,  Will refer her back to Dr. Alveda Reasons.

## 2011-03-17 LAB — CBC
HCT: 27.4 — ABNORMAL LOW
HCT: 28.4 — ABNORMAL LOW
HCT: 31 — ABNORMAL LOW
Hemoglobin: 10.1 — ABNORMAL LOW
Hemoglobin: 10.6 — ABNORMAL LOW
Hemoglobin: 10.8 — ABNORMAL LOW
Hemoglobin: 9.3 — ABNORMAL LOW
Hemoglobin: 9.4 — ABNORMAL LOW
Hemoglobin: 9.5 — ABNORMAL LOW
MCHC: 34
MCHC: 34
MCV: 88.4
Platelets: 328
Platelets: 364
Platelets: 404 — ABNORMAL HIGH
Platelets: 552 — ABNORMAL HIGH
RBC: 3.01 — ABNORMAL LOW
RBC: 3.32 — ABNORMAL LOW
RDW: 12.4
RDW: 12.6
RDW: 12.7
RDW: 12.8
RDW: 12.8
WBC: 10.6 — ABNORMAL HIGH
WBC: 11.7 — ABNORMAL HIGH
WBC: 12.4 — ABNORMAL HIGH
WBC: 13.5 — ABNORMAL HIGH

## 2011-03-17 LAB — BASIC METABOLIC PANEL
BUN: 7
CO2: 25
CO2: 28
Calcium: 8.3 — ABNORMAL LOW
Calcium: 8.5
Chloride: 102
Chloride: 104
Chloride: 107
Creatinine, Ser: 0.84
GFR calc Af Amer: >60
GFR calc Af Amer: >60
GFR calc non Af Amer: >60
GFR calc non Af Amer: >60
GFR calc non Af Amer: >60
Glucose, Bld: 117 — ABNORMAL HIGH
Glucose, Bld: 119 — ABNORMAL HIGH
Glucose, Bld: 133 — ABNORMAL HIGH
Potassium: 2.9 — ABNORMAL LOW
Potassium: 3 — ABNORMAL LOW
Potassium: 3.1 — ABNORMAL LOW
Potassium: 3.4 — ABNORMAL LOW
Potassium: 3.5
Sodium: 134 — ABNORMAL LOW
Sodium: 136
Sodium: 139

## 2011-03-17 LAB — URINALYSIS, ROUTINE W REFLEX MICROSCOPIC
Bilirubin Urine: NEGATIVE
Glucose, UA: NEGATIVE
Hgb urine dipstick: NEGATIVE
Ketones, ur: NEGATIVE
Nitrite: NEGATIVE
Specific Gravity, Urine: 1.002 — ABNORMAL LOW
pH: 8

## 2011-03-17 LAB — URINE MICROSCOPIC-ADD ON

## 2011-03-17 LAB — URINE CULTURE
Colony Count: NO GROWTH
Culture: NO GROWTH

## 2011-03-17 LAB — TYPE AND SCREEN: ABO/RH(D): A POS

## 2011-03-18 ENCOUNTER — Encounter: Payer: 59 | Admitting: Family

## 2011-03-23 ENCOUNTER — Other Ambulatory Visit: Payer: Self-pay | Admitting: Neurology

## 2011-03-23 DIAGNOSIS — M542 Cervicalgia: Secondary | ICD-10-CM

## 2011-03-28 ENCOUNTER — Other Ambulatory Visit: Payer: Self-pay | Admitting: Neurology

## 2011-03-28 ENCOUNTER — Ambulatory Visit
Admission: RE | Admit: 2011-03-28 | Discharge: 2011-03-28 | Disposition: A | Discharge status: Home / Self Care | Payer: PRIVATE HEALTH INSURANCE | Source: Ambulatory Visit | Attending: Neurology | Admitting: Neurology

## 2011-03-28 VITALS — BP 133/92 | HR 91

## 2011-03-28 DIAGNOSIS — E042 Nontoxic multinodular goiter: Secondary | ICD-10-CM

## 2011-03-28 DIAGNOSIS — M542 Cervicalgia: Secondary | ICD-10-CM

## 2011-03-28 DIAGNOSIS — M25522 Pain in left elbow: Secondary | ICD-10-CM

## 2011-03-28 DIAGNOSIS — M5090 Cervical disc disorder, unspecified, unspecified cervical region: Secondary | ICD-10-CM

## 2011-03-28 DIAGNOSIS — M7918 Myalgia, other site: Secondary | ICD-10-CM

## 2011-03-28 HISTORY — PX: FACET JOINT INJECTION: SHX5016

## 2011-03-28 MED ORDER — DEXAMETHASONE SODIUM PHOSPHATE 4 MG/ML IJ SOLN
2.0000 mg | Freq: Once | INTRAMUSCULAR | Status: AC
  Administered 2011-03-28: 2 mg via INTRA_ARTICULAR

## 2011-03-28 MED ORDER — IOHEXOL 300 MG/ML  SOLN
2.0000 mL | Freq: Once | INTRAMUSCULAR | Status: AC | PRN
  Administered 2011-03-28: 2 mL via INTRA_ARTICULAR

## 2011-04-01 LAB — URINE CULTURE: Colony Count: >=100000

## 2011-04-01 LAB — COMPREHENSIVE METABOLIC PANEL
Albumin: 3.4 — ABNORMAL LOW
Alkaline Phosphatase: 78
BUN: 9
Potassium: 3.7
Sodium: 136
Total Protein: 7.4

## 2011-04-01 LAB — CBC
HCT: 37.4
Platelets: 529 — ABNORMAL HIGH
RDW: 12.6

## 2011-04-01 LAB — URINALYSIS, ROUTINE W REFLEX MICROSCOPIC
Bilirubin Urine: NEGATIVE
Ketones, ur: NEGATIVE
Nitrite: NEGATIVE
Protein, ur: NEGATIVE
Specific Gravity, Urine: 1.015
Urobilinogen, UA: 1

## 2011-04-01 LAB — DIFFERENTIAL
Basophils Relative: 0
Monocytes Absolute: 0.7
Monocytes Relative: 8
Neutro Abs: 5.4

## 2011-04-01 LAB — APTT: aPTT: 31

## 2011-04-01 LAB — URINE MICROSCOPIC-ADD ON

## 2011-04-13 ENCOUNTER — Encounter: Payer: Self-pay | Admitting: Family

## 2011-04-13 ENCOUNTER — Ambulatory Visit (INDEPENDENT_AMBULATORY_CARE_PROVIDER_SITE_OTHER): Payer: PRIVATE HEALTH INSURANCE | Admitting: Family

## 2011-04-13 ENCOUNTER — Ambulatory Visit (HOSPITAL_BASED_OUTPATIENT_CLINIC_OR_DEPARTMENT_OTHER)
Admission: RE | Admit: 2011-04-13 | Discharge: 2011-04-13 | Disposition: A | Discharge status: Home / Self Care | Payer: 59 | Source: Ambulatory Visit | Attending: Family | Admitting: Family

## 2011-04-13 DIAGNOSIS — M5090 Cervical disc disorder, unspecified, unspecified cervical region: Secondary | ICD-10-CM

## 2011-04-13 DIAGNOSIS — Z1231 Encounter for screening mammogram for malignant neoplasm of breast: Secondary | ICD-10-CM | POA: Insufficient documentation

## 2011-04-13 DIAGNOSIS — Z0000 Encounter for general adult medical examination without abnormal findings: Secondary | ICD-10-CM | POA: Insufficient documentation

## 2011-04-13 LAB — HEPATIC FUNCTION PANEL
ALT: 12 U/L (ref 0–35)
AST: 18 U/L (ref 0–37)
Bilirubin, Direct: 0.1 mg/dL (ref 0.0–0.3)
Indirect Bilirubin: 0.1 mg/dL (ref 0.0–0.9)
Total Protein: 7.1 g/dL (ref 6.0–8.3)

## 2011-04-13 LAB — CBC WITH DIFFERENTIAL/PLATELET
Basophils Relative: 0 % (ref 0–1)
Eosinophils Relative: 3 % (ref 0–5)
HCT: 36.8 % (ref 36.0–46.0)
Hemoglobin: 11.6 g/dL — ABNORMAL LOW (ref 12.0–15.0)
MCHC: 31.5 g/dL (ref 30.0–36.0)
MCV: 87.2 fL (ref 78.0–100.0)
Monocytes Absolute: 0.6 10*3/uL (ref 0.1–1.0)
Monocytes Relative: 9 % (ref 3–12)
Neutro Abs: 3.7 10*3/uL (ref 1.7–7.7)
RDW: 12.9 % (ref 11.5–15.5)

## 2011-04-13 LAB — BASIC METABOLIC PANEL WITH GFR
Chloride: 101 mEq/L (ref 96–112)
GFR, Est African American: >90 mL/min (ref >90)
GFR, Est Non African American: >90 mL/min (ref >90)
Potassium: 3.8 mEq/L (ref 3.5–5.3)
Sodium: 139 mEq/L (ref 135–145)

## 2011-04-13 LAB — LIPID PANEL
Total CHOL/HDL Ratio: 3.3 Ratio
VLDL: 19 mg/dL (ref 0–40)

## 2011-04-13 LAB — TSH: TSH: 0.677 u[IU]/mL (ref 0.350–4.500)

## 2011-04-13 NOTE — Assessment & Plan Note (Addendum)
Pt counseled on diet, exercise and weight loss.  I encouraged her to start back with the aquatic therapy.  Check fasting lab work and mammogram. She declines flu shot today.  Plan Pap next year as she reports regular pap smears and no abnormals.

## 2011-04-13 NOTE — Patient Instructions (Signed)
Please schedule your mammogram on the first floor.  Complete your blood work on the first floor.  Follow up in 6 months.

## 2011-04-13 NOTE — Assessment & Plan Note (Signed)
This is being managed by Dr. Alveda Reasons.

## 2011-04-13 NOTE — Progress Notes (Signed)
Subjective:    Patient ID: [name], female    DOB: [birth year], 45 y.o.   MRN: [id number]  HPI  [name] is a 45 year old female here for cpx.  She is having difficulty exercising due to her surgery.  Uses a cane.  Thinks she may start back with aquatic therapy.  Not eating 3 meals a day.  Occasional sweet tea.  Monthly SBE- no abnormalities.  She has not had Pap since last year- has a gyn but wishes to have her Pap smears completed here.    Neck pain- saw Dr. Alveda Reasons- had neck injection 10/1.  l side at [facility] imaging.  Pain is starting to improve.     Review of Systems  Constitutional: Positive for unexpected weight change. Negative for fever.  HENT: Negative for ear pain and congestion.        Hoarseness  Eyes: Negative for visual disturbance.  Respiratory: Negative for shortness of breath.   Genitourinary: Negative for dysuria and hematuria.  Musculoskeletal: Positive for back pain and arthralgias.       Chest wall pain.  Skin: Negative for rash.  Neurological: Negative for tremors and weakness.  Hematological: Negative for adenopathy.  Psychiatric/Behavioral: Positive for sleep disturbance.       Insomnia   Past Medical History  Diagnosis Date  . Arthritis   . Overweight     History   Social History  . Marital Status: Married    Spouse Name: N/A    Number of Children: N/A  . Years of Education: N/A   Occupational History  . disabled    Social History Main Topics  . Smoking status: Never Smoker   . Smokeless tobacco: Never Used  . Alcohol Use: No  . Drug Use: No  . Sexually Active: Not on file   Other Topics Concern  . Not on file   Social History Narrative   Regular exercise:  NoCaffeine Use:  Occasional sweet teaShe is legally separated2 children ages 27 year old son and 59 year old daughter.  Both children.Previously worked in Engineering geologist, now disabled.Completed 2 yrs of college.      Past Surgical History  Procedure Date  . Spine surgery 10/13/06   Posterior interbody fusion (PLIF) L5-S1 (07/04/07)  . Knee surgery      Right knee arthroscopic partial lateral meniscectomy (01/22/07),arthroscopic partial lateral meniscectomy 5/09  . Fracture surgery 2011    Due to injury at work in 2008.  . Facet joint injection 03/28/11    injections in neck    Family History  Problem Relation Age of Onset  . Asthma Mother   . Cancer Mother     gastric tumor  . Glaucoma Mother   . Hyperlipidemia Mother   . Hypertension Maternal Aunt     No Known Allergies  Current Outpatient Prescriptions on File Prior to Visit  Medication Sig Dispense Refill  . Hydrocodone-Acetaminophen (NORCO PO) Take by mouth.          BP 120/82  Pulse 72  Temp(Src) 98.1 F (36.7 C) (Oral)  Resp 16  Ht 5\' 3"  (1.6 m)  Wt 221 lb 0.6 oz (100.263 kg)  BMI 39.16 kg/m2  LMP 02/25/2009       Objective:   Physical Exam  Constitutional: She is oriented to person, place, and time. She appears well-developed and well-nourished. No distress.  HENT:  Head: Normocephalic and atraumatic.  Right Ear: Tympanic membrane and ear canal normal.  Left Ear: Tympanic membrane  and ear canal normal.  Mouth/Throat: Uvula is midline, oropharynx is clear and moist and mucous membranes are normal. No oropharyngeal exudate.  Cardiovascular: Normal rate and regular rhythm.   No murmur heard. Pulmonary/Chest: Effort normal and breath sounds normal. No respiratory distress. She has no wheezes. She has no rales. She exhibits no tenderness.  Abdominal: Soft. Bowel sounds are normal. She exhibits no distension and no mass. There is no tenderness. There is no rebound and no guarding.  Genitourinary:       Breast exam is normal.  No masses, puckering, dimpling or axillary LAD    Musculoskeletal: She exhibits no edema.  Neurological: She is alert and oriented to person, place, and time. She displays normal reflexes. Coordination normal.  Skin: Skin is warm and dry.  Psychiatric: She has a  normal mood and affect. Her behavior is normal. Judgment and thought content normal.          Assessment & Plan:

## 2011-04-15 ENCOUNTER — Encounter: Payer: Self-pay | Admitting: Family

## 2011-04-20 ENCOUNTER — Telehealth: Payer: Self-pay | Admitting: Family

## 2011-04-20 NOTE — Telephone Encounter (Signed)
Pt notified of labs per 04/15/11 letter and advised her she should be getting a copy in the mail soon. Advised pt that mammogram is normal.

## 2011-04-20 NOTE — Telephone Encounter (Signed)
Patient is requesting lab and mammogram results

## 2011-08-22 ENCOUNTER — Ambulatory Visit (INDEPENDENT_AMBULATORY_CARE_PROVIDER_SITE_OTHER): Payer: 59 | Admitting: Family

## 2011-08-22 ENCOUNTER — Encounter: Payer: Self-pay | Admitting: Family

## 2011-08-22 DIAGNOSIS — R079 Chest pain, unspecified: Secondary | ICD-10-CM | POA: Insufficient documentation

## 2011-08-22 DIAGNOSIS — R1013 Epigastric pain: Secondary | ICD-10-CM

## 2011-08-22 NOTE — Progress Notes (Signed)
Subjective:    Patient ID: [name], female    DOB: [birth year], 46 y.o.   MRN: [id number]  HPI  Abdominal pain/diarrhea- "really bad." Symptoms started about 3 weeks ago. She notes that if she ate rice/gravy/pork chop- few minutes later she developed stomach cramping. She reports epigastric pain.  This is followed by watery diarrhea.  She reports associated fatigue. Lately she has had these symptoms about 1-2 times a week.  Yesterday when it occurred she noted blood on the tissue.  She has been told that she has hemorrhoids.  She does report some rectal pain with straining.    Chest pain-  She reports that this has been ongoing for 2 weeks.  Feels some palpitations.  Has a faint discomfort- comes and goes.  Like a throbbing pain. Worse when laying down.  She is not that active, but notes she has had this happen before.  Symptoms feel different from indigestion.  Denies sob at rest.  Does report that she has some sob with exertion.  She denies associated nausea. She denies associated jaw pain, but she does note some pain down her left arm.  She reports that she had an EKG performed 2 yrs ago at Dr. Yetta Numbers office.  (urgent care)- she report that her EKG was reportedly normal at that time.   She denies known family hx of heart disease.   Review of Systems Past Medical History  Diagnosis Date  . Arthritis   . Overweight     History   Social History  . Marital Status: Married    Spouse Name: N/A    Number of Children: N/A  . Years of Education: N/A   Occupational History  . disabled    Social History Main Topics  . Smoking status: Never Smoker   . Smokeless tobacco: Never Used  . Alcohol Use: No  . Drug Use: No  . Sexually Active: Not on file   Other Topics Concern  . Not on file   Social History Narrative   Regular exercise:  NoCaffeine Use:  Occasional sweet teaShe is legally separated2 children ages 59 year old son and 21 year old daughter.  Both children.Previously  worked in Engineering geologist, now disabled.Completed 2 yrs of college.      Past Surgical History  Procedure Date  . Spine surgery 10/13/06    Posterior interbody fusion (PLIF) L5-S1 (07/04/07)  . Knee surgery      Right knee arthroscopic partial lateral meniscectomy (01/22/07),arthroscopic partial lateral meniscectomy 5/09  . Fracture surgery 2011    Due to injury at work in 2008.  . Facet joint injection 03/28/11    injections in neck    Family History  Problem Relation Age of Onset  . Asthma Mother   . Cancer Mother     gastric tumor  . Glaucoma Mother   . Hyperlipidemia Mother   . Hypertension Maternal Aunt     No Known Allergies  Current Outpatient Prescriptions on File Prior to Visit  Medication Sig Dispense Refill  . Calcium Carbonate-Vitamin D (CALTRATE 600+D) 600-400 MG-UNIT per tablet Take 1 tablet by mouth 2 (two) times daily.        . Hydrocodone-Acetaminophen (NORCO PO) Take by mouth.          BP 136/80  Pulse 84  Temp(Src) 98.1 F (36.7 C) (Oral)  Resp 16  Wt 218 lb 1.3 oz (98.92 kg)  SpO2 99%  LMP 02/25/2009       Objective:  Physical Exam  Constitutional: She is oriented to person, place, and time. She appears well-developed and well-nourished. No distress.  HENT:  Head: Normocephalic and atraumatic.  Cardiovascular: Normal rate and regular rhythm.   No murmur heard. Pulmonary/Chest: Effort normal and breath sounds normal. No respiratory distress. She has no wheezes. She has no rales. She exhibits no tenderness.  Neurological: She is alert and oriented to person, place, and time.  Psychiatric: She has a normal mood and affect. Her behavior is normal. Judgment and thought content normal.  Rectal- no visible hemorrhoids.  Tender internal anal canal without palpable masses/hemorrhoids.  Heme negative today.       Assessment & Plan:

## 2011-08-22 NOTE — Patient Instructions (Signed)
You will be contacted about your stress test.  Please go to the ER if you develop worsening chest pain. Schedule your abdominal ultrasound on the first floor. Complete your blood work prior to leaving today.  Follow up in 1 week.

## 2011-08-22 NOTE — Assessment & Plan Note (Addendum)
Will obtain abd [place] to evaluate gallbladder.  Obtain LFTs, amylase (to exclude pancreatitis). CBC to evaluate for anemia.  Pt instructed to go to the ED if worsening abdominal pain or inability to keep down food/meds. She is heme negative today.  She had small amount of blood on tissue once- monitor for now.

## 2011-08-22 NOTE — Assessment & Plan Note (Addendum)
46 yr old female with intermittent chest pain. Denies chest pain today.  EKG is personally reviewed.  Noted to have TWI in v1-v5.  No EKG for comparison.  Reviewed case with Dr. Rodena Medin. Will plan to send for stress test.  She is instructed to go to the ED if she develops recurrent/worsening chest pain.

## 2011-08-23 ENCOUNTER — Ambulatory Visit (HOSPITAL_BASED_OUTPATIENT_CLINIC_OR_DEPARTMENT_OTHER)
Admission: RE | Admit: 2011-08-23 | Discharge: 2011-08-23 | Disposition: A | Discharge status: Home / Self Care | Payer: 59 | Source: Ambulatory Visit | Attending: Family | Admitting: Family

## 2011-08-23 ENCOUNTER — Telehealth: Payer: Self-pay | Admitting: Family

## 2011-08-23 DIAGNOSIS — R1013 Epigastric pain: Secondary | ICD-10-CM

## 2011-08-23 DIAGNOSIS — D7389 Other diseases of spleen: Secondary | ICD-10-CM

## 2011-08-23 LAB — LIPASE: Lipase: 43 U/L (ref 0–75)

## 2011-08-23 LAB — HEPATIC FUNCTION PANEL
AST: 17 U/L (ref 0–37)
Alkaline Phosphatase: 83 U/L (ref 39–117)
Bilirubin, Direct: 0.1 mg/dL (ref 0.0–0.3)
Indirect Bilirubin: 0.2 mg/dL (ref 0.0–0.9)
Total Bilirubin: 0.3 mg/dL (ref 0.3–1.2)

## 2011-08-23 LAB — CBC
HCT: 37.9 % (ref 36.0–46.0)
Hemoglobin: 12.4 g/dL (ref 12.0–15.0)
MCH: 28.4 pg (ref 26.0–34.0)
MCHC: 32.7 g/dL (ref 30.0–36.0)
MCV: 86.7 fL (ref 78.0–100.0)
Platelets: 458 K/uL — ABNORMAL HIGH (ref 150–400)
RBC: 4.37 MIL/uL (ref 3.87–5.11)
RDW: 13.1 % (ref 11.5–15.5)
WBC: 8.3 K/uL (ref 4.0–10.5)

## 2011-08-23 NOTE — Telephone Encounter (Signed)
Please let pt know that Ultrasound shows normal gallbladder, liver function testing is normal.  She should let us know if her abdominal discomfort worsens or if it does not improve. In the meantime, I recommend that she start prilosec otc once daily.

## 2011-08-24 ENCOUNTER — Telehealth: Payer: Self-pay | Admitting: Family

## 2011-08-24 NOTE — Telephone Encounter (Signed)
Left message on machine to return my call. 

## 2011-08-24 NOTE — Telephone Encounter (Signed)
Advised pt per Melissa's instructions. Pt reports that her abdominal pain still continues but does not seem worse. She will try Prilosec OTC and will f/u with [place] on Monday as scheduled.  Pt states she has not heard from anyone re: scheduling her stress test. Please advise.

## 2011-08-24 NOTE — Telephone Encounter (Signed)
Left message on voicemail to return my call.  

## 2011-08-24 NOTE — Telephone Encounter (Signed)
[facility], spoke with physician, obtained prior Auth for adenosine myoview-  [id number].

## 2011-08-24 NOTE — Telephone Encounter (Signed)
I spoke with cardiology and her stress test is scheduled for 3/11 at 9 AM.  Cardiology should be contacting her about the details.

## 2011-08-24 NOTE — Telephone Encounter (Signed)
Pt notified and has already received call from cardiology.

## 2011-08-25 ENCOUNTER — Telehealth: Payer: Self-pay | Admitting: *Deleted

## 2011-08-25 NOTE — Telephone Encounter (Signed)
Ok to take

## 2011-08-25 NOTE — Telephone Encounter (Signed)
Pt.notified

## 2011-08-25 NOTE — Telephone Encounter (Signed)
Received voice message from pt stating she picked up Prilosec OTC and contraindications say not to take if you have "heart conditions". Pt wants to know if it is safe for her to take this until she has stress test and determines the cause of her chest pain.  Please advise.

## 2011-08-29 ENCOUNTER — Encounter: Payer: Self-pay | Admitting: Gastroenterology

## 2011-08-29 ENCOUNTER — Emergency Department (HOSPITAL_BASED_OUTPATIENT_CLINIC_OR_DEPARTMENT_OTHER)
Admission: EM | Admit: 2011-08-29 | Discharge: 2011-08-29 | Disposition: A | Discharge status: Home / Self Care | Payer: 59 | Attending: Emergency Medicine | Admitting: Emergency Medicine

## 2011-08-29 ENCOUNTER — Ambulatory Visit (INDEPENDENT_AMBULATORY_CARE_PROVIDER_SITE_OTHER): Payer: 59 | Admitting: Family

## 2011-08-29 ENCOUNTER — Encounter: Payer: Self-pay | Admitting: Family

## 2011-08-29 ENCOUNTER — Encounter (HOSPITAL_BASED_OUTPATIENT_CLINIC_OR_DEPARTMENT_OTHER): Payer: Self-pay | Admitting: Family Medicine

## 2011-08-29 ENCOUNTER — Other Ambulatory Visit: Payer: Self-pay

## 2011-08-29 ENCOUNTER — Emergency Department (INDEPENDENT_AMBULATORY_CARE_PROVIDER_SITE_OTHER): Payer: 59

## 2011-08-29 DIAGNOSIS — R1013 Epigastric pain: Secondary | ICD-10-CM

## 2011-08-29 DIAGNOSIS — R079 Chest pain, unspecified: Secondary | ICD-10-CM

## 2011-08-29 DIAGNOSIS — Z8739 Personal history of other diseases of the musculoskeletal system and connective tissue: Secondary | ICD-10-CM | POA: Insufficient documentation

## 2011-08-29 DIAGNOSIS — R0789 Other chest pain: Secondary | ICD-10-CM

## 2011-08-29 DIAGNOSIS — K219 Gastro-esophageal reflux disease without esophagitis: Secondary | ICD-10-CM | POA: Insufficient documentation

## 2011-08-29 DIAGNOSIS — R0602 Shortness of breath: Secondary | ICD-10-CM | POA: Insufficient documentation

## 2011-08-29 LAB — D-DIMER, QUANTITATIVE: D-Dimer, Quant: <0.22 ug/mL-FEU (ref 0.00–0.48)

## 2011-08-29 LAB — CBC
HCT: 35.9 % — ABNORMAL LOW (ref 36.0–46.0)
Hemoglobin: 12.1 g/dL (ref 12.0–15.0)
MCH: 28.5 pg (ref 26.0–34.0)
MCHC: 33.7 g/dL (ref 30.0–36.0)
MCV: 84.5 fL (ref 78.0–100.0)

## 2011-08-29 LAB — DIFFERENTIAL
Basophils Relative: 0 % (ref 0–1)
Monocytes Absolute: 0.8 10*3/uL (ref 0.1–1.0)
Monocytes Relative: 8 % (ref 3–12)
Neutro Abs: 6.1 10*3/uL (ref 1.7–7.7)

## 2011-08-29 LAB — COMPREHENSIVE METABOLIC PANEL
AST: 14 U/L (ref 0–37)
Albumin: 3.7 g/dL (ref 3.5–5.2)
Alkaline Phosphatase: 82 U/L (ref 39–117)
BUN: 8 mg/dL (ref 6–23)
Chloride: 102 mEq/L (ref 96–112)
Potassium: 3.8 mEq/L (ref 3.5–5.1)
Total Bilirubin: 0.2 mg/dL — ABNORMAL LOW (ref 0.3–1.2)

## 2011-08-29 LAB — CARDIAC PANEL(CRET KIN+CKTOT+MB+TROPI): Troponin I: <0.3 ng/mL (ref <0.30)

## 2011-08-29 MED ORDER — SUCRALFATE 1 GM/10ML PO SUSP: 1.0000 g | Freq: Four times a day (QID) | ORAL | Status: DC

## 2011-08-29 MED ORDER — GI COCKTAIL ~~LOC~~
30.0000 mL | Freq: Once | ORAL | Status: AC
  Administered 2011-08-29: 30 mL via ORAL
  Filled 2011-08-29: qty 30

## 2011-08-29 NOTE — ED Provider Notes (Signed)
History     CSN: [id number]  Arrival date & time 08/29/11  1049   First MD Initiated Contact with Patient 08/29/11 1110      Chief Complaint  Patient presents with  . Chest Pain    (Consider location/radiation/quality/duration/timing/severity/associated sxs/prior treatment) Patient is a 46 y.o. female presenting with chest pain. The history is provided by the patient.  Chest Pain The chest pain began 1 - 2 weeks ago. Chest pain occurs intermittently. The chest pain is worsening. The pain is associated with eating (lying flat). At its most intense, the pain is at 9/10. The pain is currently at 5/10. The severity of the pain is moderate. The quality of the pain is described as aching and burning. The pain radiates to the left arm. Chest pain is worsened by certain positions and eating (movement of the left arm and lying on the left side). Primary symptoms include shortness of breath, palpitations, abdominal pain and dizziness. Pertinent negatives for primary symptoms include no fever, no cough, no nausea and no vomiting.  The palpitations also occurred with dizziness and shortness of breath.   Onset: more than 2 weeks. The abdominal pain has been gradually worsening since its onset. The abdominal pain is located in the epigastric region. The abdominal pain does not radiate. The severity of the abdominal pain is 4/10. The abdominal pain is relieved by nothing (started prilosec over the last 4 days without relief).  Dizziness does not occur with nausea, vomiting, weakness or diaphoresis.   Pertinent negatives for associated symptoms include no diaphoresis and no weakness. She tried nothing for the symptoms. Risk factors include no known risk factors.  Pertinent negatives for past medical history include no diabetes, no DVT, no hyperlipidemia, no hypertension and no MI.  Pertinent negatives for family medical history include: no heart disease in family.  Procedure history is negative for stress  echo.     Past Medical History  Diagnosis Date  . Arthritis   . Overweight     Past Surgical History  Procedure Date  . Spine surgery 10/13/06    Posterior interbody fusion (PLIF) L5-S1 (07/04/07)  . Knee surgery      Right knee arthroscopic partial lateral meniscectomy (01/22/07),arthroscopic partial lateral meniscectomy 5/09  . Fracture surgery 2011    Due to injury at work in 2008.  . Facet joint injection 03/28/11    injections in neck    Family History  Problem Relation Age of Onset  . Asthma Mother   . Cancer Mother     gastric tumor  . Glaucoma Mother   . Hyperlipidemia Mother   . Hypertension Maternal Aunt     History  Substance Use Topics  . Smoking status: Never Smoker   . Smokeless tobacco: Never Used  . Alcohol Use: No    OB History    Grav Para Term Preterm Abortions TAB SAB Ect Mult Living                  Review of Systems  Constitutional: Negative for fever and diaphoresis.  Respiratory: Positive for shortness of breath. Negative for cough.   Cardiovascular: Positive for chest pain and palpitations.  Gastrointestinal: Positive for abdominal pain. Negative for nausea and vomiting.  Neurological: Positive for dizziness. Negative for weakness.  All other systems reviewed and are negative.    Allergies  Review of patient's allergies indicates no known allergies.  Home Medications   Current Outpatient Rx  Name Route Sig Dispense Refill  .  CALCIUM CARBONATE-VITAMIN D 600-400 MG-UNIT PO TABS Oral Take 1 tablet by mouth 2 (two) times daily.      . NORCO PO Oral Take by mouth.      . METHOCARBAMOL 500 MG PO TABS Oral Take 500 mg by mouth 3 (three) times daily.    Marland Kitchen OMEPRAZOLE 20 MG PO CPDR Oral Take 20 mg by mouth daily.      BP 114/74  Pulse 85  Temp(Src) 98.5 F (36.9 C) (Oral)  Resp 15  SpO2 100%  LMP 02/25/2009  Physical Exam  Nursing note and vitals reviewed. Constitutional: She is oriented to person, place, and time. She appears  well-developed and well-nourished. No distress.  HENT:  Head: Normocephalic and atraumatic.  Mouth/Throat: Oropharynx is clear and moist.  Eyes: EOM are normal. Pupils are equal, round, and reactive to light.  Cardiovascular: Normal rate, regular rhythm, normal heart sounds and intact distal pulses.  Exam reveals no friction rub.   No murmur heard. Pulmonary/Chest: Effort normal and breath sounds normal. She has no wheezes. She has no rales.  Abdominal: Soft. Bowel sounds are normal. She exhibits no distension. There is tenderness in the epigastric area. There is no rebound, no guarding and no CVA tenderness.    Musculoskeletal: Normal range of motion. She exhibits no tenderness.       No edema  Neurological: She is alert and oriented to person, place, and time. No cranial nerve deficit.  Skin: Skin is warm and dry. No rash noted.  Psychiatric: She has a normal mood and affect. Her behavior is normal.    ED Course  Procedures (including critical care time)  Labs Reviewed  CBC - Abnormal; Notable for the following:    HCT 35.9 (*)    All other components within normal limits  COMPREHENSIVE METABOLIC PANEL - Abnormal; Notable for the following:    Total Bilirubin 0.2 (*)    All other components within normal limits  DIFFERENTIAL  CARDIAC PANEL(CRET KIN+CKTOT+MB+TROPI)  D-DIMER, QUANTITATIVE   Dg Chest 2 View  08/29/2011  *RADIOLOGY REPORT*  Clinical Data: Chest pain, shortness of breath  CHEST - 2 VIEW  Comparison: 06/27/2007.  Findings: Lungs are clear. No pleural effusion or pneumothorax.  The heart is top normal in size.  Mild degenerative changes of the visualized thoracolumbar spine. Lower lumbar fixation hardware, incompletely visualized.  IMPRESSION: No evidence of acute cardiopulmonary disease.  Original Report Authenticated By: Charline Bills, M.D.    Date: 08/29/2011  Rate: 86  Rhythm: normal sinus rhythm  QRS Axis: normal  Intervals: normal  ST/T Wave abnormalities:  nonspecific T wave changes t-wave inversion in V2  Conduction Disutrbances:none  Narrative Interpretation:   Old EKG Reviewed: unchanged    1. Atypical chest pain   2. GERD (gastroesophageal reflux disease)       MDM  Pt with atypical story for CP which is positional, worse with eating and lying down.  She states the pain has not resolved totally in 2 weeks.  TIMI 0 and no risk factors.  Low risk well's and d-dimer neg.  EKG unchanged with t-wave inversion in V2 only.  CXR wnl. CBC, BMP, CE wnl.  Due to persistent sx for the last 2 weeks feel if this was cardiac that enzymes would be abnormal by now.  12:33 PM Spoke with patient's PCP and discuss normal findings. Patient did improve after a GI cocktail. She has a stress test set up for Monday and her PCP setting her up with GI  as well. Feel the likelihood that this is cardiac. Also d-dimer negative and low suspicion for PE. Will give patient Carafate and have her continue Prilosec which she's only been on for 3 days. She will follow up with her doctor as planned.  Gwyneth Sprout, MD 08/29/11 1452

## 2011-08-29 NOTE — Progress Notes (Signed)
Subjective:    Patient ID: [name], female    DOB: 03-Aug-1965, 46 y.o.   MRN: [id number]  HPI  [name] is a 46 yr old female who presents today for follow up her chest pain.  Initially seen on 2/25 for intermittent chest pain complaint.  At that time [name] was not having active chest pain and her ekg was noted to have TWI in v1-v5.  Today [name] reports some pain in her left neck radiates down the left arm.  + pulling feeling in the chest, tightening.  Overall, feels that her chest pain is worsening. Feels fatigue with walking. Mild associated nausea.  [name] reports some associated SOB with exertion. Having trouble sleeping due to chest pain.  Has stress test scheduled for 3/11.  [name] reports epigastric discomfort is unchanged.  [name] has started the prilosec. [name] reports that [name] has been trying to stay away from greasy foods.      Review of Systems See HPI  Past Medical History  Diagnosis Date  . Arthritis   . Overweight     History   Social History  . Marital Status: Married    Spouse Name: N/A    Number of Children: N/A  . Years of Education: N/A   Occupational History  . disabled    Social History Main Topics  . Smoking status: Never Smoker   . Smokeless tobacco: Never Used  . Alcohol Use: No  . Drug Use: No  . Sexually Active: Not on file   Other Topics Concern  . Not on file   Social History Narrative   Regular exercise:  NoCaffeine Use:  Occasional sweet teaShe is legally separated2 children ages 3 year old son and 53 year old daughter.  Both children.Previously worked in Engineering geologist, now disabled.Completed 2 yrs of college.      Past Surgical History  Procedure Date  . Spine surgery 10/13/06    Posterior interbody fusion (PLIF) L5-S1 (07/04/07)  . Knee surgery      Right knee arthroscopic partial lateral meniscectomy (01/22/07),arthroscopic partial lateral meniscectomy 5/09  . Fracture surgery 2011    Due to injury at work in 2008.  . Facet joint injection  03/28/11    injections in neck    Family History  Problem Relation Age of Onset  . Asthma Mother   . Cancer Mother     gastric tumor  . Glaucoma Mother   . Hyperlipidemia Mother   . Hypertension Maternal Aunt     No Known Allergies  Current Outpatient Prescriptions on File Prior to Visit  Medication Sig Dispense Refill  . Calcium Carbonate-Vitamin D (CALTRATE 600+D) 600-400 MG-UNIT per tablet Take 1 tablet by mouth 2 (two) times daily.        . Hydrocodone-Acetaminophen (NORCO PO) Take by mouth.        . methocarbamol (ROBAXIN) 500 MG tablet Take 500 mg by mouth 3 (three) times daily.      Marland Kitchen omeprazole (PRILOSEC) 20 MG capsule Take 20 mg by mouth daily.        BP 152/80  Pulse 64  Temp(Src) 98.1 F (36.7 C) (Oral)  Resp 16  Wt 221 lb (100.245 kg)  SpO2 96%  LMP 02/25/2009       Objective:   Physical Exam  Constitutional: [name] is oriented to person, place, and time. [name] appears well-developed and well-nourished. No distress.  Cardiovascular: Normal rate and regular rhythm.   No murmur heard. Pulmonary/Chest: Effort normal and breath  sounds normal. No respiratory distress. [name] has no wheezes. [name] has no rales. [name] exhibits no tenderness.  Musculoskeletal: [name] exhibits no edema.  Neurological: [name] is alert and oriented to person, place, and time.  Skin: Skin is warm and dry.  Psychiatric: [name] has a normal mood and affect. Her behavior is normal. Judgment and thought content normal.          Assessment & Plan:  Report given to Vickie charge RN at [facility] ED.

## 2011-08-29 NOTE — Assessment & Plan Note (Addendum)
Deteriorated- will refer pt downstairs to ED for further evaluation.  If work up negative, plan to keep her upcoming stress test on 3/11.  EKG performed today in the office is compared to EKG from last week and appears unchanged.

## 2011-08-29 NOTE — Discharge Instructions (Signed)
Chest Pain (Nonspecific) Chest pain has many causes. Your pain could be caused by something serious, such as a heart attack or a blood clot in the lungs. It could also be caused by something less serious, such as a chest bruise or a virus. Follow up with your doctor. More lab tests or other studies may be needed to find the cause of your pain. Most of the time, nonspecific chest pain will improve within 2 to 3 days of rest and mild pain medicine. HOME CARE  For chest bruises, you may put ice on the sore area for 15 to 20 minutes, 3 to 4 times a day. Do this only if it makes you or your child feel better.   Put ice in a plastic bag.   Place a towel between the skin and the bag.   Rest for the next 2 to 3 days.   Go back to work if the pain improves.   See your doctor if the pain lasts longer than 1 to 2 weeks.   Only take medicine as told by your doctor.   Quit smoking if you smoke.  GET HELP RIGHT AWAY IF:   There is more pain or pain that spreads to the arm, neck, jaw, back, or belly (abdomen).   You or your child has shortness of breath.   You or your child coughs more than usual or coughs up blood.   You or your child has very bad back or belly pain, feels sick to his or her stomach (nauseous), or throws up (vomits).   You or your child has very bad weakness.   You or your child passes out (faints).   You or your child has a temperature by mouth above 102 F (38.9 C), not controlled by medicine.  Any of these problems may be serious and may be an emergency. Do not wait to see if the problems will go away. Get medical help right away. Call your local emergency services 911 in [place] Do not drive yourself to the hospital. MAKE SURE YOU:   Understand these instructions.   Will watch this condition.   Will get help right away if you or your child is not doing well or gets worse.  Document Released: 11/30/2007 Document Revised: 06/02/2011 Document Reviewed:  11/30/2007 [employer] Patient Information 2012 [employer]. 

## 2011-08-29 NOTE — ED Notes (Signed)
Pt sent here from Dr. Arvil Chaco office for chest pain x 2 wks worsening in past wk. Pt had ekg upstairs and is scheduled for rest adenosine myoview on March 11. Pt also c/o mild shob, lightheadedness. Pt reports laying flat increases tightness in chest.

## 2011-08-29 NOTE — Patient Instructions (Signed)
You will be contacted about your referral to Gastroenterology.  Go directly downstairs to the Emergency Department for further evaluation of your chest pain.

## 2011-08-29 NOTE — Assessment & Plan Note (Signed)
Abdominal ultrasound and blood work unremarkable last visit.  She has started omeprazole. Will refer to GI for further evaluation.

## 2011-09-05 ENCOUNTER — Ambulatory Visit (HOSPITAL_COMMUNITY): Payer: 59 | Attending: Family | Admitting: Radiology

## 2011-09-05 DIAGNOSIS — R5381 Other malaise: Secondary | ICD-10-CM | POA: Insufficient documentation

## 2011-09-05 DIAGNOSIS — R079 Chest pain, unspecified: Secondary | ICD-10-CM | POA: Insufficient documentation

## 2011-09-05 DIAGNOSIS — R0609 Other forms of dyspnea: Secondary | ICD-10-CM | POA: Insufficient documentation

## 2011-09-05 DIAGNOSIS — R0602 Shortness of breath: Secondary | ICD-10-CM

## 2011-09-05 DIAGNOSIS — R42 Dizziness and giddiness: Secondary | ICD-10-CM | POA: Insufficient documentation

## 2011-09-05 DIAGNOSIS — R002 Palpitations: Secondary | ICD-10-CM | POA: Insufficient documentation

## 2011-09-05 DIAGNOSIS — M542 Cervicalgia: Secondary | ICD-10-CM | POA: Insufficient documentation

## 2011-09-05 DIAGNOSIS — R000 Tachycardia, unspecified: Secondary | ICD-10-CM | POA: Insufficient documentation

## 2011-09-05 DIAGNOSIS — R0989 Other specified symptoms and signs involving the circulatory and respiratory systems: Secondary | ICD-10-CM | POA: Insufficient documentation

## 2011-09-05 DIAGNOSIS — M79609 Pain in unspecified limb: Secondary | ICD-10-CM | POA: Insufficient documentation

## 2011-09-05 MED ORDER — TECHNETIUM TC 99M TETROFOSMIN IV KIT
30.0000 | PACK | Freq: Once | INTRAVENOUS | Status: AC | PRN
  Administered 2011-09-05: 30 via INTRAVENOUS

## 2011-09-05 MED ORDER — TECHNETIUM TC 99M TETROFOSMIN IV KIT
10.0000 | PACK | Freq: Once | INTRAVENOUS | Status: AC | PRN
  Administered 2011-09-05: 10 via INTRAVENOUS

## 2011-09-05 MED ORDER — REGADENOSON 0.4 MG/5ML IV SOLN
0.4000 mg | Freq: Once | INTRAVENOUS | Status: AC
  Administered 2011-09-05: 0.4 mg via INTRAVENOUS

## 2011-09-05 NOTE — Progress Notes (Signed)
[facility] SITE 3 NUCLEAR MED [address] [place] [address] [phone]  Cardiology Nuclear Med Study  [name] is a 46 y.o. female [id number] [birth year]   Nuclear Med Background Indication for Stress Test:  Evaluation for Ischemia and 08/29/11 Post Hospital: Chest Pain, (-) enzymes History:  No previous documented CAD Cardiac Risk Factors: NA  Symptoms:  Chest Pain/Tightness at rest and exertion (last date of chest discomfort last week),Left neck/arm pain, Dizziness, DOE, Fatigue, Nausea with Chest Pain, Palpitations and Rapid HR   Nuclear Pre-Procedure Caffeine/Decaff Intake:  None NPO After: 9:00pm   Lungs:  clear IV 0.9% NS with Angio Cath:  20g  IV Site: R Antecubital  IV Started by:  Stanton Kidney, EMT-P  Chest Size (in):  40 Cup Size: C  Height: 5\' 4"  (1.626 m)  Weight:  216 lb (97.977 kg)  BMI:  Body mass index is 37.08 kg/(m^2). Tech Comments:  NA    Nuclear Med Study 1 or 2 day study: 1 day  Stress Test Type:  Lexiscan  Reading MD: Cassell Clement, MD  Order Authorizing Provider:  Roni Bread and Efraim Kaufmann O'Sullivan,NP  Resting Radionuclide: Technetium 66m Tetrofosmin  Resting Radionuclide Dose: 10.7 mCi   Stress Radionuclide:  Technetium 72m Tetrofosmin  Stress Radionuclide Dose: 33.0 mCi           Stress Protocol Rest HR: 80 Stress HR: 116  Rest BP: 122/74 Stress BP: 133/74  Exercise Time (min): n/a METS: n/a   Predicted Max HR: 177 bpm % Max HR: 65.54 bpm Rate Pressure Product: 21308   Dose of Adenosine (mg):  n/a Dose of Lexiscan: 0.4 mg  Dose of Atropine (mg): n/a Dose of Dobutamine: n/a mcg/kg/min (at max HR)  Stress Test Technologist: Cathlyn Parsons, RN  Nuclear Technologist:  Domenic Polite, CNMT     Rest Procedure:  Myocardial perfusion imaging was performed at rest 45 minutes following the intravenous administration of Technetium 42m Tetrofosmin. Rest ECG: NSR with non-specific T wave changes  Stress  Procedure:  The patient received IV Lexiscan 0.4 mg over 15-seconds.  Technetium 23m Tetrofosmin injected at 30-seconds. Patient had chest tightness 6/10,dizziness and SOB with infusion. There were no significant changes with Lexiscan.  Quantitative spect images were obtained after a 45 minute delay. Stress ECG: No significant change from baseline ECG  QPS Raw Data Images:  Normal; no motion artifact; normal heart/lung ratio. Stress Images:  Normal homogeneous uptake in all areas of the myocardium. Rest Images:  Normal homogeneous uptake in all areas of the myocardium. Subtraction (SDS):  No evidence of ischemia. Transient Ischemic Dilatation (Normal <1.22):  0.97 Lung/Heart Ratio (Normal <0.45):  0.25  Quantitative Gated Spect Images QGS EDV:  85 ml QGS ESV:  33 ml QGS cine images:  NL LV Function; NL Wall Motion QGS EF: 61%  Impression Exercise Capacity:  Lexiscan with no exercise. BP Response:  Normal blood pressure response. Clinical Symptoms:  Mild chest pain/dyspnea. ECG Impression:  Baseline T wave inversion V1-V4 unchanged from resting EKG Comparison with Prior Nuclear Study: No images to compare  Overall Impression:  Low risk stress nuclear study.  Baseline EKG T wave abnormalities unchanged with lexiscan stress. Mild apical thinning more prominent on stress images but no ischemia seen.  Normal LV systolic function with no wall motion abnormalities.  EF 61%  Cassell Clement

## 2011-09-06 ENCOUNTER — Telehealth: Payer: Self-pay | Admitting: Family

## 2011-09-06 ENCOUNTER — Other Ambulatory Visit (HOSPITAL_COMMUNITY): Payer: Self-pay | Admitting: Radiology

## 2011-09-06 NOTE — Telephone Encounter (Signed)
Pt notified and advised to keep GI appt on 09/08/11 and f/u with [place] on 10/14/11.

## 2011-09-06 NOTE — Telephone Encounter (Signed)
Pls call pt and let her know that stress test is normal.

## 2011-09-06 NOTE — ED Provider Notes (Signed)
Order(s) created erroneously. Erroneous order ID: [id number] Order moved by: DOHERTY-CARBONE, Dannielle Baskins M Order move date/time: 09/06/2011 11:03 AM Source Patient:    [id number] Source Contact: 09/06/2011 Destination Patient:    [id number] Destination Contact: 09/05/2011

## 2011-09-06 NOTE — ED Provider Notes (Signed)
Order(s) created erroneously. Erroneous order ID: [id number] Order moved by: Tanna Savoy Order move date/time: 09/06/2011 11:03 AM Source Patient:    [id number] Source Contact: 09/06/2011 Destination Patient:    [id number] Destination Contact: 09/05/2011

## 2011-09-06 NOTE — Telephone Encounter (Signed)
See below

## 2011-09-08 ENCOUNTER — Encounter: Payer: Self-pay | Admitting: Gastroenterology

## 2011-09-08 ENCOUNTER — Ambulatory Visit (INDEPENDENT_AMBULATORY_CARE_PROVIDER_SITE_OTHER): Payer: 59 | Admitting: Gastroenterology

## 2011-09-08 VITALS — BP 124/84 | HR 68 | Ht 63.0 in | Wt 221.0 lb

## 2011-09-08 DIAGNOSIS — K219 Gastro-esophageal reflux disease without esophagitis: Secondary | ICD-10-CM

## 2011-09-08 DIAGNOSIS — R109 Unspecified abdominal pain: Secondary | ICD-10-CM | POA: Insufficient documentation

## 2011-09-08 DIAGNOSIS — R079 Chest pain, unspecified: Secondary | ICD-10-CM

## 2011-09-08 DIAGNOSIS — R197 Diarrhea, unspecified: Secondary | ICD-10-CM | POA: Insufficient documentation

## 2011-09-08 MED ORDER — DEXLANSOPRAZOLE 60 MG PO CPDR: 60.0000 mg | DELAYED_RELEASE_CAPSULE | Freq: Every day | ORAL | Status: DC

## 2011-09-08 MED ORDER — PEG-KCL-NACL-NASULF-NA ASC-C 100 G PO SOLR: 1.0000 | Freq: Once | ORAL | Status: DC

## 2011-09-08 MED ORDER — HYOSCYAMINE-PHENYLTOLOXAMINE 0.0625-15 MG PO CAPS: 1.0000 | ORAL_CAPSULE | Freq: Two times a day (BID) | ORAL | Status: DC

## 2011-09-08 NOTE — Progress Notes (Signed)
History of Present Illness:  This is a very nice 46 year old African American female her for her by Dr. Sandford Craze for evaluation of crampy abdominal pain with periodic diarrhea, occasional rectal bleeding, all over the last 5 months worse over the last month. She has frequent bowel movements with associated abdominal cramping, also epigastric pain rather marked acid reflux symptoms also over this time. She has been on Prilosec and Carafate with minimal improvement. Patient does take when necessary hydrocodone and Robaxin for chronic back pain associated with a work related injury. Patient is status post hysterectomy 13 years ago. Recent ultrasound was unremarkable except for some splenic granulomas. Recent cardiac evaluation including Myoscan been unremarkable. Review of her labs shows no specific abnormalities. The patient does complain of fatigue, shortness of breath exertion, and periodic palpitations. There is no history of NSAID, alcohol, or cigarette abuse. She is disabled because of her back injury. Evidently she had colonoscopy elsewhere 2-3 years ago. His report is not available for review. Patient denies lactose intolerance, use of sorbitol fructose. Family history is noncontributory.  I have reviewed this patient's present history, medical and surgical past history, allergies and medications.     ROS: The remainder of the 10 point ROS is negative.. positive for fatigue, shortness of breath exertion, night sweats, myalgias, chronic insomnia, peripheral edema, and recent hoarseness. She has had was a big surgery on her knees and back. Also complains of arthritis in her large joints without actual joint swelling.     Physical Exam: General well developed well nourished patient in no acute distress, appearing her stated age Eyes PERRLA, no icterus, fundoscopic exam per opthamologist Skin no lesions noted Neck supple, no adenopathy, no thyroid enlargement, no tenderness Chest clear to  percussion and auscultation Heart no significant murmurs, gallops or rubs noted Abdomen no hepatosplenomegaly masses or tenderness, BS normal.  Extremities no acute joint lesions, edema, phlebitis or evidence of cellulitis. Neurologic patient oriented x 3, cranial nerves intact, no focal neurologic deficits noted. Psychological mental status normal and normal affect.  Assessment and plan: Symptoms consistent with GERD, and I have scheduled endoscopic exam with biopsies for H. pylori and had to exclude celiac disease with her associated abdominal cramping and diarrhea. I have started her on Dexilant 60 mg a day with standard antireflux maneuvers, when necessary Levsin, and an anti-reflux diet regime. Colonoscopy also scheduled propofol sedation. We need to exclude underlying inflammatory bowel disease, malabsorption, versus IBS. I've advised to continue other medications as per primary care.  Encounter Diagnoses  Name Primary?  . Abdominal  pain, other specified site Yes  . Diarrhea   . Chest pain

## 2011-09-08 NOTE — Patient Instructions (Signed)
Your procedure has been scheduled for 09/19/2011, please follow the seperate instructions.  Your prescription(s) have been sent to you pharmacy.   Diet for GERD or PUD Nutrition therapy can help ease the discomfort of gastroesophageal reflux disease (GERD) and peptic ulcer disease (PUD).  HOME CARE INSTRUCTIONS   Eat your meals slowly, in a relaxed setting.   Eat 5 to 6 small meals per day.   If a food causes distress, stop eating it for a period of time.  FOODS TO AVOID  Coffee, regular or decaffeinated.   Cola beverages, regular or low calorie.   Tea, regular or decaffeinated.   Pepper.   Cocoa.   High fat foods, including meats.   Butter, margarine, hydrogenated oil (trans fats).   Peppermint or spearmint (if you have GERD).   Fruits and vegetables if not tolerated.   Alcohol.   Nicotine (smoking or chewing). This is one of the most potent stimulants to acid production in the gastrointestinal tract.   Any food that seems to aggravate your condition.  If you have questions regarding your diet, ask your caregiver or a registered dietitian. TIPS  Lying flat may make symptoms worse. Keep the head of your bed raised 6 to 9 inches (15 to 23 cm) by using a foam wedge or blocks under the legs of the bed.   Do not lay down until 3 hours after eating a meal.   Daily physical activity may help reduce symptoms.  MAKE SURE YOU:   Understand these instructions.   Will watch your condition.   Will get help right away if you are not doing well or get worse.  Document Released: 06/13/2005 Document Revised: 06/02/2011 Document Reviewed: 04/29/2011 [facility] Patient Information 2012 [place], [place].

## 2011-09-19 ENCOUNTER — Ambulatory Visit (AMBULATORY_SURGERY_CENTER): Payer: 59 | Admitting: Gastroenterology

## 2011-09-19 ENCOUNTER — Other Ambulatory Visit: Payer: Self-pay | Admitting: Gastroenterology

## 2011-09-19 ENCOUNTER — Encounter: Payer: Self-pay | Admitting: Gastroenterology

## 2011-09-19 VITALS — BP 129/69 | HR 91 | Temp 98.3°F | Resp 23

## 2011-09-19 DIAGNOSIS — R109 Unspecified abdominal pain: Secondary | ICD-10-CM

## 2011-09-19 DIAGNOSIS — K529 Noninfective gastroenteritis and colitis, unspecified: Secondary | ICD-10-CM

## 2011-09-19 DIAGNOSIS — R197 Diarrhea, unspecified: Secondary | ICD-10-CM

## 2011-09-19 DIAGNOSIS — Z860100 Personal history of colon polyps, unspecified: Secondary | ICD-10-CM

## 2011-09-19 DIAGNOSIS — K5289 Other specified noninfective gastroenteritis and colitis: Secondary | ICD-10-CM

## 2011-09-19 DIAGNOSIS — K219 Gastro-esophageal reflux disease without esophagitis: Secondary | ICD-10-CM

## 2011-09-19 DIAGNOSIS — R1013 Epigastric pain: Secondary | ICD-10-CM

## 2011-09-19 DIAGNOSIS — D1330 Benign neoplasm of unspecified part of small intestine: Secondary | ICD-10-CM

## 2011-09-19 DIAGNOSIS — D126 Benign neoplasm of colon, unspecified: Secondary | ICD-10-CM

## 2011-09-19 DIAGNOSIS — K921 Melena: Secondary | ICD-10-CM | POA: Insufficient documentation

## 2011-09-19 DIAGNOSIS — R079 Chest pain, unspecified: Secondary | ICD-10-CM

## 2011-09-19 HISTORY — DX: Personal history of colon polyps, unspecified: Z86.0100

## 2011-09-19 HISTORY — DX: Personal history of colonic polyps: Z86.010

## 2011-09-19 LAB — HM COLONOSCOPY

## 2011-09-19 MED ORDER — MESALAMINE ER 0.375 G PO CP24: ORAL_CAPSULE | ORAL | Status: DC

## 2011-09-19 MED ORDER — SODIUM CHLORIDE 0.9 % IV SOLN: 500.0000 mL | INTRAVENOUS | Status: DC

## 2011-09-19 NOTE — Op Note (Signed)
[facility] [address] [place], [place]  [address]  ENDOSCOPY PROCEDURE REPORT  PATIENT:  [name], [name]  MR#:  [id number] BIRTHDATE:  1966-06-26, 45 yrs. old  GENDER:  female  ENDOSCOPIST:  Vania Rea. Jarold Motto, MD, [facility] Referred by:  Sandford Craze, FNP  PROCEDURE DATE:  09/19/2011 PROCEDURE:  EGD with biopsy, 62130, EGD with biopsy for H. pylori [id number] ASA CLASS:  Class II INDICATIONS:  GERD, diarrhea  MEDICATIONS:   There was residual sedation effect present from prior procedure., propofol (Diprivan) 150 mg IV TOPICAL ANESTHETIC:  DESCRIPTION OF PROCEDURE:   After the risks and benefits of the procedure were explained, informed consent was obtained.  The LB GIF-H180 [id number] endoscope was introduced through the mouth and advanced to the second portion of the duodenum.  The instrument was slowly withdrawn as the mucosa was fully examined. <<PROCEDUREIMAGES>>  Otherwise the examination was normal. CLO BX. DONE AND DUODENAL BIOPSIES DONE.    Retroflexed views revealed no abnormalities. The scope was then withdrawn from the patient and the procedure completed.  COMPLICATIONS:  None  ENDOSCOPIC IMPRESSION: 1) Otherwise normal examination 1.R/O CELIAC DISEASE 2.GERD ON PPI RX. 3.R/O H.PYLORI RECOMMENDATIONS: 1) Await biopsy results 2) Rx CLO if positive 3) continue current meds  ______________________________ Vania Rea. Jarold Motto, MD, [facility]  CC:  n. eSIGNED:   Vania Rea. Filmore Molyneux at 09/19/2011 03:28 PM  Maretta Los, [phone]

## 2011-09-19 NOTE — Op Note (Signed)
[facility] [address] [place], [place]  [address]  COLONOSCOPY PROCEDURE REPORT  PATIENT:  [name], [name]  MR#:  [id number] BIRTHDATE:  06/15/1966, 45 yrs. old  GENDER:  female ENDOSCOPIST:  Vania Rea. Jarold Motto, MD, [facility] REF. BY:  Sandford Craze, FNP PROCEDURE DATE:  09/19/2011 PROCEDURE:  Colonoscopy with biopsy ASA CLASS:  Class II INDICATIONS:  colorectal cancer screening, unexplained diarrhea, hematochezia MEDICATIONS:   propofol (Diprivan) 150 mg IV  DESCRIPTION OF PROCEDURE:   After the risks and benefits and of the procedure were explained, informed consent was obtained. Digital rectal exam was performed and revealed no abnormalities. The [id number] [id number] endoscope was introduced through the anus and advanced to the cecum, which was identified by both the appendix and ileocecal valve.  The quality of the prep was excellent, using MoviPrep.  The instrument was then slowly withdrawn as the colon was fully examined. <<PROCEDUREIMAGES>>  FINDINGS:  ULTRASONIC FINDINGS:   A diminutive polyp was found in the cecum.  MM CECAL POLYP COLD BIOPSIED. The mucosa was erythematus in the rectum and sigmoid colon. ERYTHEMA AND MUCOSAL FRIABILITY IN RECTOSIGMOID AREA BIOPSIED.  This was otherwise a normal examination of the colon. VERY TORTUOUS AND REDUNDANT COLON !!!   Retroflexion was not performed.  The scope was then withdrawn from the patient and the procedure completed.  COMPLICATIONS:  None ENDOSCOPIC IMPRESSION: 1) Erythema in the rectum and sigmoid colon 2) Diminutive polyp in the cecum 3) Otherwise normal examination R/O LEFT SIDED COLITIS VS PREP CHANGES. RECOMMENDATIONS: 1) Avoid all NSAIDS for the next 2 weeks. 2) Await biopsy results APRISO 375 MG 4 DAILY,SEE ME I MONTH IN OFFICE.  REPEAT EXAM:  No  ______________________________ Vania Rea. Jarold Motto, MD, [facility]  CC:  n. eSIGNED:   Vania Rea. Hedy Garro at 09/19/2011 03:23 PM  Maretta Los,  [phone]

## 2011-09-19 NOTE — Progress Notes (Signed)
Patient did not experience any of the following events: a burn prior to discharge; a fall within the facility; wrong site/side/patient/procedure/implant event; or a hospital transfer or hospital admission upon discharge from the facility. (G8907) Patient did not have preoperative order for IV antibiotic SSI prophylaxis. (G8918)  

## 2011-09-19 NOTE — Patient Instructions (Signed)
Discharge instructions given with verbal understanding. Handout on polyps given. Resume previous medications. Avoid NSAIDS for the next 2 weeks. YOU HAD AN ENDOSCOPIC PROCEDURE TODAY AT THE [facility]: Refer to the procedure report that was given to you for any specific questions about what was found during the examination.  If the procedure report does not answer your questions, please call your gastroenterologist to clarify.  If you requested that your care partner not be given the details of your procedure findings, then the procedure report has been included in a sealed envelope for you to review at your convenience later.  YOU SHOULD EXPECT: Some feelings of bloating in the abdomen. Passage of more gas than usual.  Walking can help get rid of the air that was put into your GI tract during the procedure and reduce the bloating. If you had a lower endoscopy (such as a colonoscopy or flexible sigmoidoscopy) you may notice spotting of blood in your stool or on the toilet paper. If you underwent a bowel prep for your procedure, then you may not have a normal bowel movement for a few days.  DIET: Your first meal following the procedure should be a light meal and then it is ok to progress to your normal diet.  A half-sandwich or bowl of soup is an example of a good first meal.  Heavy or fried foods are harder to digest and may make you feel nauseous or bloated.  Likewise meals heavy in dairy and vegetables can cause extra gas to form and this can also increase the bloating.  Drink plenty of fluids but you should avoid alcoholic beverages for 24 hours.  ACTIVITY: Your care partner should take you home directly after the procedure.  You should plan to take it easy, moving slowly for the rest of the day.  You can resume normal activity the day after the procedure however you should NOT DRIVE or use heavy machinery for 24 hours (because of the sedation medicines used during the test).    SYMPTOMS  TO REPORT IMMEDIATELY: A gastroenterologist can be reached at any hour.  During normal business hours, 8:30 AM to 5:00 PM Monday through Friday, call [phone].  After hours and on weekends, please call the GI answering service at [phone] who will take a message and have the physician on call contact you.   Following lower endoscopy (colonoscopy or flexible sigmoidoscopy):  Excessive amounts of blood in the stool  Significant tenderness or worsening of abdominal pains  Swelling of the abdomen that is new, acute  Fever of 100F or higher  Following upper endoscopy (EGD)  Vomiting of blood or coffee ground material  New chest pain or pain under the shoulder blades  Painful or persistently difficult swallowing  New shortness of breath  Fever of 100F or higher  Black, tarry-looking stools  FOLLOW UP: If any biopsies were taken you will be contacted by phone or by letter within the next 1-3 weeks.  Call your gastroenterologist if you have not heard about the biopsies in 3 weeks.  Our staff will call the home number listed on your records the next business day following your procedure to check on you and address any questions or concerns that you may have at that time regarding the information given to you following your procedure. This is a courtesy call and so if there is no answer at the home number and we have not heard from you through the emergency physician on call,  we will assume that you have returned to your regular daily activities without incident.  SIGNATURES/CONFIDENTIALITY: You and/or your care partner have signed paperwork which will be entered into your electronic medical record.  These signatures attest to the fact that that the information above on your After Visit Summary has been reviewed and is understood.  Full responsibility of the confidentiality of this discharge information lies with you and/or your care-partner.

## 2011-09-20 ENCOUNTER — Telehealth: Payer: Self-pay | Admitting: *Deleted

## 2011-09-20 LAB — HELICOBACTER PYLORI SCREEN-BIOPSY: UREASE: NEGATIVE

## 2011-09-20 NOTE — Telephone Encounter (Signed)
  Follow up Call-  Call back number 09/19/2011  Post procedure Call Back phone  # [phone]  Permission to leave phone message Yes     Patient questions:  Do you have a fever, pain , or abdominal swelling? no Pain Score  0 *  Have you tolerated food without any problems? yes  Have you been able to return to your normal activities? yes  Do you have any questions about your discharge instructions: Diet   no Medications  no Follow up visit  no  Do you have questions or concerns about your Care? no  Actions: * If pain score is 4 or above: No action needed, pain <4. PATIENT STATING SHE IS STILL GASSY, UNABLE TO RATE AS FAR AS PAIN SCALE. STATES ITS NOT PAIN, JUST GAS. INSTRUCTED TO DRINK WARM FLUIDS, AND TO CALL WITH ANY PROBLEMS.

## 2011-09-26 ENCOUNTER — Encounter: Payer: Self-pay | Admitting: Gastroenterology

## 2011-10-11 ENCOUNTER — Telehealth: Payer: Self-pay | Admitting: Cardiology

## 2011-10-11 NOTE — Telephone Encounter (Signed)
Stress Test faxed to [facility] @ [phone] 10/11/11/KM

## 2011-10-14 ENCOUNTER — Encounter: Payer: Self-pay | Admitting: Family

## 2011-10-14 ENCOUNTER — Ambulatory Visit (INDEPENDENT_AMBULATORY_CARE_PROVIDER_SITE_OTHER): Payer: 59 | Admitting: Family

## 2011-10-14 VITALS — BP 128/74 | HR 79 | Temp 97.8°F | Resp 16 | Ht 63.0 in | Wt 213.1 lb

## 2011-10-14 DIAGNOSIS — R109 Unspecified abdominal pain: Secondary | ICD-10-CM

## 2011-10-14 DIAGNOSIS — K219 Gastro-esophageal reflux disease without esophagitis: Secondary | ICD-10-CM

## 2011-10-14 MED ORDER — SUCRALFATE 1 GM/10ML PO SUSP: 1.0000 g | Freq: Four times a day (QID) | ORAL | Status: DC

## 2011-10-14 NOTE — Patient Instructions (Signed)
Please follow up in 3 months.  

## 2011-10-14 NOTE — Progress Notes (Signed)
Subjective:    Patient ID: [name], female    DOB: [birth year], 46 y.o.   MRN: [id number]  HPI  Ms.  Meditz is a 46 yr old female who presents today for follow up.  1) Abdominal pain- Still having "stomach issues." Using Apriso with some improving.  Continues to have cramping and diarrhea which is worsened by fresh fruits/veggies. She is back to eating a very bland diet which consists mainly of grilled/baked chicken.  She saw [facility] and had a colonoscopy which noted colitis on exam (normal path) and a tubular adenoma.   2)GERD- she continues dexilant. Endoscopy was unremarkable. Urease was neg per GI.       Review of Systems See HPI  Past Medical History  Diagnosis Date  . Arthritis   . Overweight   . Anemia     History   Social History  . Marital Status: Married    Spouse Name: N/A    Number of Children: 2  . Years of Education: N/A   Occupational History  . disabled    Social History Main Topics  . Smoking status: Never Smoker   . Smokeless tobacco: Never Used  . Alcohol Use: No  . Drug Use: No  . Sexually Active: Not on file   Other Topics Concern  . Not on file   Social History Narrative   Regular exercise:  NoCaffeine Use:  Occasional sweet teaShe is legally separated2 children ages 70 year old son and 3 year old daughter.  Both children.Previously worked in Engineering geologist, now disabled.Completed 2 yrs of college.      Past Surgical History  Procedure Date  . Spine surgery 10/13/06    Posterior interbody fusion (PLIF) L5-S1 (07/04/07)  . Knee surgery      Right knee arthroscopic partial lateral meniscectomy (01/22/07),arthroscopic partial lateral meniscectomy 5/09  . Fracture surgery 2011    Due to injury at work in 2008.  . Facet joint injection 03/28/11    injections in neck    Family History  Problem Relation Age of Onset  . Asthma Mother   . Cancer Mother     gastric tumor  . Glaucoma Mother   . Hyperlipidemia Mother   . Hypertension Maternal  Aunt   . COPD Mother     No Known Allergies  Current Outpatient Prescriptions on File Prior to Visit  Medication Sig Dispense Refill  . Calcium Carbonate-Vitamin D (CALTRATE 600+D) 600-400 MG-UNIT per tablet Take 1 tablet by mouth 2 (two) times daily.        . Hydrocodone-Acetaminophen (NORCO PO) Take by mouth.        . mesalamine (APRISO) 0.375 G 24 hr capsule Take 4 tablets by mouth once a day  120 capsule  0  . methocarbamol (ROBAXIN) 500 MG tablet Take 500 mg by mouth 3 (three) times daily.      Marland Kitchen dexlansoprazole (DEXILANT) 60 MG capsule Take 1 capsule (60 mg total) by mouth daily.  30 capsule  1  . Hyoscyamine-Phenyltoloxamine 4.0981-19 MG CAPS Take 1 capsule by mouth 2 (two) times daily.  60 each  1  . sucralfate (CARAFATE) 1 GM/10ML suspension Take 10 mLs (1 g total) by mouth 4 (four) times daily.  420 mL  2  . DISCONTD: omeprazole (PRILOSEC) 20 MG capsule Take 20 mg by mouth daily.        BP 128/74  Pulse 79  Temp(Src) 97.8 F (36.6 C) (Oral)  Resp 16  Ht 5\' 3"  (1.6  m)  Wt 213 lb 1.3 oz (96.652 kg)  BMI 37.75 kg/m2  SpO2 99%  LMP 02/25/2009       Objective:   Physical Exam  Constitutional: She appears well-developed and well-nourished. No distress.  Cardiovascular: Normal rate and regular rhythm.   No murmur heard. Pulmonary/Chest: Effort normal and breath sounds normal. No respiratory distress. She has no wheezes. She has no rales. She exhibits no tenderness.  Abdominal: Soft. Bowel sounds are normal. She exhibits no distension.  Psychiatric: She has a normal mood and affect. Her behavior is normal. Judgment and thought content normal.          Assessment & Plan:

## 2011-10-17 NOTE — Assessment & Plan Note (Signed)
Continue PPI.  She is requesting refill on carafate as this helped her GI discomfort.

## 2011-10-17 NOTE — Assessment & Plan Note (Signed)
Unchanged- ? IBS- defer management to GI. She has apt with Dr. Jarold Motto on 4/24.

## 2011-10-18 ENCOUNTER — Encounter: Payer: Self-pay | Admitting: *Deleted

## 2011-10-19 ENCOUNTER — Encounter: Payer: Self-pay | Admitting: Gastroenterology

## 2011-10-19 ENCOUNTER — Ambulatory Visit (INDEPENDENT_AMBULATORY_CARE_PROVIDER_SITE_OTHER): Payer: 59 | Admitting: Gastroenterology

## 2011-10-19 DIAGNOSIS — R109 Unspecified abdominal pain: Secondary | ICD-10-CM

## 2011-10-19 DIAGNOSIS — K5010 Crohn's disease of large intestine without complications: Secondary | ICD-10-CM

## 2011-10-19 DIAGNOSIS — K589 Irritable bowel syndrome without diarrhea: Secondary | ICD-10-CM

## 2011-10-19 MED ORDER — ALIGN PO CAPS: 1.0000 | ORAL_CAPSULE | Freq: Every day | ORAL | Status: DC

## 2011-10-19 MED ORDER — METHSCOPOLAMINE BROMIDE 5 MG PO TABS: 5.0000 mg | ORAL_TABLET | Freq: Two times a day (BID) | ORAL | Status: DC

## 2011-10-19 NOTE — Patient Instructions (Signed)
Take Pamine Forte one tablet by mouth twice a day, rx sent to your pharmacy.  Take Align once a day, samples given Follow the FODMAP diet given. Make follow up office visit for 3 weeks.

## 2011-10-20 ENCOUNTER — Encounter: Payer: Self-pay | Admitting: Gastroenterology

## 2011-10-20 NOTE — Progress Notes (Addendum)
History of Present Illness: This is a 46 year old female with recent endoscopy and colonoscopy. She is much improved at this time continues with occasional diarrhea. She did have some sigmoid colon diverticulosis and segmental colitis that is improved with by mouth aminosialyicate. She also had a adenomatous colon polyp removed. Endoscopy was fairly unremarkable. She is 75% better  ,ande is following a high-fiber diet as tolerated.    Current Medications, Allergies, Past Medical History, Past Surgical History, Family History and Social History were reviewed in [facility] record.   Assessment and plan: Segmental colitis improved on aminosialyicate therapy with an element of IBS. We will continue current plans with when necessary followup as needed. Her GERD is managed with Dexilant 60 mg a day. Biopsy for H. pylori and celiac disease were negative. Encounter Diagnosis  Name Primary?  . Abdominal pain Yes

## 2011-10-26 ENCOUNTER — Other Ambulatory Visit: Payer: Self-pay | Admitting: Gastroenterology

## 2011-10-27 ENCOUNTER — Telehealth: Payer: Self-pay | Admitting: Gastroenterology

## 2011-10-27 NOTE — Telephone Encounter (Signed)
Last OV 10/19/11 for f/u after ECL on 09/19/11. She was started on Apriso and was doing well. Pt reports she tries to watch her diet, but ate at her son's school and she believes she got a chicken breast with seasoning on it Wednesday. This am she developed stomach cramps with diarrhea and blood in her stool. At first the blood was on the tissue only but with straining, she had mucus and blood on the tissue and in the bowl. Pt states there's no blood now, but she is very tired and fatigued. Any advice or action? Thanks.

## 2011-10-28 MED ORDER — MESALAMINE 1000 MG RE SUPP: RECTAL | Status: DC

## 2011-10-28 NOTE — Telephone Encounter (Signed)
Informed pt Dr Jarold Motto ordered canasa suppositories, 1 rectally at bedtime for 2 weeks; pt stated understanding.

## 2011-10-28 NOTE — Telephone Encounter (Signed)
Same meds and canasa 1 gm supp qhs for 2 weeks

## 2011-11-04 ENCOUNTER — Encounter: Payer: Self-pay | Admitting: *Deleted

## 2011-11-10 ENCOUNTER — Other Ambulatory Visit (INDEPENDENT_AMBULATORY_CARE_PROVIDER_SITE_OTHER): Payer: 59

## 2011-11-10 ENCOUNTER — Ambulatory Visit (INDEPENDENT_AMBULATORY_CARE_PROVIDER_SITE_OTHER): Payer: 59 | Admitting: Gastroenterology

## 2011-11-10 ENCOUNTER — Encounter: Payer: Self-pay | Admitting: Gastroenterology

## 2011-11-10 VITALS — BP 106/70 | HR 80 | Ht 64.0 in | Wt 202.2 lb

## 2011-11-10 DIAGNOSIS — K219 Gastro-esophageal reflux disease without esophagitis: Secondary | ICD-10-CM

## 2011-11-10 DIAGNOSIS — R197 Diarrhea, unspecified: Secondary | ICD-10-CM

## 2011-11-10 DIAGNOSIS — K589 Irritable bowel syndrome without diarrhea: Secondary | ICD-10-CM

## 2011-11-10 MED ORDER — CILIDINIUM-CHLORDIAZEPOXIDE 2.5-5 MG PO CAPS: 1.0000 | ORAL_CAPSULE | Freq: Three times a day (TID) | ORAL | Status: DC | PRN

## 2011-11-10 NOTE — Progress Notes (Signed)
This is a 46 year old African American female with several months of intermittent abdominal cramping, gas, bloating, and diarrhea. Her colonoscopy showed diverticulosis with possible segmental colitis, but this was not confirmed by pathologic biopsies. I saw her several weeks ago when she related that she was improved on by mouth amino salicylates,Canasa suppositories when necessary for rectal bleeding and PPI medication. Now, she denies upper GI complaints but continues with intermittent abdominal cramping and periodic diarrhea. In between these episodes she has chronic functional constipation. She denies lactose intolerance. Review of her labs shows no specific abnormalities. Current Medications, Allergies, Past Medical History, Past Surgical History, Family History and Social History were reviewed in [facility] record.  Pertinent Review of Systems Negative   Physical Exam: Healthy-appearing patient in no distress. Blood pressure 106/70, pulse 80 and regular, and BMI 34.7 to with a weight of 202 pounds. The patient allegedly has a 20-30 pound weight loss. I cannot appreciate stigmata of chronic liver disease. Her abdominal exam shows no organomegaly, masses, or tenderness. Bowel sounds are normal. Mental status is normal.    Assessment and Plan: Diverticulosis coli with associated IBS. I will order stool exams for culture, C. difficile, WBC exam, and O&P, serum CRP and sed rate. I placed her on high fiber diet with daily Metamucil, and when necessary Librax. It is of note that this patient is on regular hydrocodone. I have discontinued her Apriso, Carafate, and suppositories. For her GERD we will continue Dexilant therapy. No diagnosis found.

## 2011-11-10 NOTE — Patient Instructions (Addendum)
Stop Carafate, Canasa, Apriso.  Please go to the basement today for your labs.  Take Librax as needed, Your prescription(s) have been sent to you pharmacy.  Follow a high fiber diet Buy Metamucil OTC once a day.   High Fiber Diet A high fiber diet changes your normal diet to include more whole grains, legumes, fruits, and vegetables. Changes in the diet involve replacing refined carbohydrates with unrefined foods. The calorie level of the diet is essentially unchanged. The Dietary Reference Intake (recommended amount) for adult males is 38 g per day. For adult females, it is 25 g per day. Pregnant and lactating women should consume 28 g of fiber per day. Fiber is the intact part of a plant that is not broken down during digestion. Functional fiber is fiber that has been isolated from the plant to provide a beneficial effect in the body. PURPOSE  Increase stool bulk.   Ease and regulate bowel movements.   Lower cholesterol.  INDICATIONS THAT YOU NEED MORE FIBER  Constipation and hemorrhoids.   Uncomplicated diverticulosis (intestine condition) and irritable bowel syndrome.   Weight management.   As a protective measure against hardening of the arteries (atherosclerosis), diabetes, and cancer.  NOTE OF CAUTION If you have a digestive or bowel problem, ask your caregiver for advice before adding high fiber foods to your diet. Some of the following medical problems are such that a high fiber diet should not be used without consulting your caregiver:  Acute diverticulitis (intestine infection).   Partial small bowel obstructions.   Complicated diverticular disease involving bleeding, rupture (perforation), or abscess (boil, furuncle).   Presence of autonomic neuropathy (nerve damage) or gastric paresis (stomach cannot empty itself).  GUIDELINES FOR INCREASING FIBER  Start adding fiber to the diet slowly. A gradual increase of about 5 more grams (2 slices of whole-wheat bread, 2  servings of most fruits or vegetables, or 1 bowl of high fiber cereal) per day is best. Too rapid an increase in fiber may result in constipation, flatulence, and bloating.   Drink enough water and fluids to keep your urine clear or pale yellow. Water, juice, or caffeine-free drinks are recommended. Not drinking enough fluid may cause constipation.   Eat a variety of high fiber foods rather than one type of fiber.   Try to increase your intake of fiber through using high fiber foods rather than fiber pills or supplements that contain small amounts of fiber.   The goal is to change the types of food eaten. Do not supplement your present diet with high fiber foods, but replace foods in your present diet.  INCLUDE A VARIETY OF FIBER SOURCES  Replace refined and processed grains with whole grains, canned fruits with fresh fruits, and incorporate other fiber sources. White rice, white breads, and most bakery goods contain little or no fiber.   Brown whole-grain rice, buckwheat oats, and many fruits and vegetables are all good sources of fiber. These include: broccoli, Brussels sprouts, cabbage, cauliflower, beets, sweet potatoes, white potatoes (skin on), carrots, tomatoes, eggplant, squash, berries, fresh fruits, and dried fruits.   Cereals appear to be the richest source of fiber. Cereal fiber is found in whole grains and bran. Bran is the fiber-rich outer coat of cereal grain, which is largely removed in refining. In whole-grain cereals, the bran remains. In breakfast cereals, the largest amount of fiber is found in those with "bran" in their names. The fiber content is sometimes indicated on the label.   You may  need to include additional fruits and vegetables each day.   In baking, for 1 cup white flour, you may use the following substitutions:   1 cup whole-wheat flour minus 2 tbs.    cup white flour plus  cup whole-wheat flour.  Document Released: 06/13/2005 Document Revised: 06/02/2011  Document Reviewed: 04/21/2009 [facility] Patient Information 2012 [place], [place].

## 2011-11-11 ENCOUNTER — Other Ambulatory Visit: Payer: Self-pay | Admitting: Gastroenterology

## 2011-11-11 DIAGNOSIS — R7982 Elevated C-reactive protein (CRP): Secondary | ICD-10-CM

## 2011-11-11 MED ORDER — METRONIDAZOLE 250 MG PO TABS: ORAL_TABLET | ORAL | Status: DC

## 2011-11-15 ENCOUNTER — Telehealth: Payer: Self-pay | Admitting: *Deleted

## 2011-11-15 ENCOUNTER — Ambulatory Visit (INDEPENDENT_AMBULATORY_CARE_PROVIDER_SITE_OTHER)
Admission: RE | Admit: 2011-11-15 | Discharge: 2011-11-15 | Disposition: A | Discharge status: Home / Self Care | Payer: 59 | Source: Ambulatory Visit | Attending: Gastroenterology | Admitting: Gastroenterology

## 2011-11-15 DIAGNOSIS — R109 Unspecified abdominal pain: Secondary | ICD-10-CM

## 2011-11-15 DIAGNOSIS — R7982 Elevated C-reactive protein (CRP): Secondary | ICD-10-CM

## 2011-11-15 MED ORDER — IOHEXOL 300 MG/ML  SOLN
100.0000 mL | Freq: Once | INTRAMUSCULAR | Status: AC | PRN
  Administered 2011-11-15: 100 mL via INTRAVENOUS

## 2011-11-15 NOTE — Telephone Encounter (Signed)
I have advised patient that Dr Jarold Motto has reviewed her CT scan and has found it to be normal. I have requested that she still turn in stool studies as previously recommended and to keep her current follow up appointment which is already scheduled. Patient verbalizes understanding.

## 2011-11-15 NOTE — Telephone Encounter (Signed)
Message copied by Richardson Chiquito on Tue Nov 15, 2011  3:22 PM ------      Message from: Mardella Layman      Created: Tue Nov 15, 2011  1:42 PM       Call her please. CT scan is normal. I need her stool exams as requested. Clinic followup in 2-3 weeks.

## 2011-11-24 ENCOUNTER — Telehealth: Payer: Self-pay | Admitting: Gastroenterology

## 2011-11-24 NOTE — Telephone Encounter (Signed)
Pt had CT scan on 11/15/11 and reports she hasn't had a BM since. Recommended Miralax until Dr Jarold Motto returns in am, but pt reports " normal " laxatives don't help her usually. Please advise. Thanks.

## 2011-11-25 NOTE — Telephone Encounter (Signed)
Supra prep is fine.Marland KitchenMarland Kitchen

## 2011-11-25 NOTE — Telephone Encounter (Signed)
Informed pt Dr Jarold Motto would like for her to try Su Prep for her constipation. Left her a sample at the front desk; pt stated understanding and will call for further problems.

## 2011-11-30 ENCOUNTER — Other Ambulatory Visit: Payer: Self-pay | Admitting: Gastroenterology

## 2011-11-30 ENCOUNTER — Other Ambulatory Visit: Payer: 59

## 2011-11-30 DIAGNOSIS — R197 Diarrhea, unspecified: Secondary | ICD-10-CM

## 2011-12-02 LAB — CLOSTRIDIUM DIFFICILE BY PCR: Toxigenic C. Difficile by PCR: NOT DETECTED

## 2011-12-02 LAB — OVA AND PARASITE SCREEN: OP: NONE SEEN

## 2011-12-07 ENCOUNTER — Encounter: Payer: Self-pay | Admitting: *Deleted

## 2011-12-13 ENCOUNTER — Encounter: Payer: Self-pay | Admitting: Gastroenterology

## 2011-12-13 ENCOUNTER — Ambulatory Visit (INDEPENDENT_AMBULATORY_CARE_PROVIDER_SITE_OTHER): Payer: 59 | Admitting: Gastroenterology

## 2011-12-13 ENCOUNTER — Other Ambulatory Visit (INDEPENDENT_AMBULATORY_CARE_PROVIDER_SITE_OTHER): Payer: 59

## 2011-12-13 VITALS — BP 100/60 | HR 80 | Ht 64.0 in | Wt 202.0 lb

## 2011-12-13 DIAGNOSIS — K5900 Constipation, unspecified: Secondary | ICD-10-CM

## 2011-12-13 DIAGNOSIS — R109 Unspecified abdominal pain: Secondary | ICD-10-CM

## 2011-12-13 DIAGNOSIS — K589 Irritable bowel syndrome without diarrhea: Secondary | ICD-10-CM

## 2011-12-13 LAB — FERRITIN: Ferritin: 69.8 ng/mL (ref 10.0–291.0)

## 2011-12-13 LAB — IBC PANEL
Saturation Ratios: 21.4 % (ref 20.0–50.0)
Transferrin: 180 mg/dL — ABNORMAL LOW (ref 212.0–360.0)

## 2011-12-13 MED ORDER — LINACLOTIDE 290 MCG PO CAPS: 1.0000 | ORAL_CAPSULE | Freq: Every day | ORAL | Status: DC

## 2011-12-13 NOTE — Progress Notes (Addendum)
This is a puzzling case of a 46 year old African American female who has had a year of abdominal pain described mostly in the upper abdominal area him with initial diarrhea which is now turned to constipation without melena or hematochezia. She has pain with eating in epigastric area but denies nausea and vomiting. She allegedly has had a 15 pound weight loss. There is no history of pancreatitis or hepatitis. Extensive workup including endoscopy and colonoscopy and multiple stool exams and lab tests have been unremarkable except for a mildly elevated CRP, sedimentation rate, and initial stool culture which showed Escherichia coli O157/H7 positive without evidence of Salmonella, Shigella, or other pathogens. Colon biopsy showed no evidence of colitis, and a trial of oral amino salicylates was ineffective. Endoscopy was unremarkable including small bowel and gastric biopsies. The patient denies systemic complaints, arthritis, skin rashes or mouth sores. Treatment with probiotics and anti-spasmodic have not been beneficial. Currently she is markedly constipated with upper abdominal discomfort. She denies acid reflux or hepatobiliary complaints. Recent CT scan of the abdomen was entirely unremarkable. Family history is noncontributory.  Current Medications, Allergies, Past Medical History, Past Surgical History, Family History and Social History were reviewed in [employer] [facility] record.  Pertinent Review of Systems Negative   Physical Exam: Blood pressure 100/60, pulse 80 and regular, weight 202 pounds a BMI of 34.67. I cannot appreciate stigmata of chronic liver disease. Chest is clear and cardiac exam is unremarkable. Her abdomen shows no organomegaly, masses, tenderness, or distention. Bowel sounds are normal. Peripheral extremities are unremarkable and mental status is normal. Rectal exam shows no masses or tenderness with soft stool present which is guaiac negative. There is no evidence of  an impaction.    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 I will repeat her stool culture for routine pathogens and pathogenic Escherichia coli,Elastase-1 exam to assess pancreatic function, repeat sedimentation rate and CRP, and 24-hour urine 5-HIAA. Also have given her samples of Linzess 290 mcg a day for her constipation and abdominal pain. This patient is frustrated with her continued symptomatology, and will probably need referral to [facility] for a second opinion. She is to discontinue anticholinergic therapy but to continue Metamucil and liberal by mouth fluids. Encounter Diagnoses  Name Primary?  . Constipation Yes  . Abdominal  pain, other specified site

## 2011-12-13 NOTE — Patient Instructions (Addendum)
Your physician has requested that you go to the basement for lab work before leaving today. We have given you samples of the following medication to take: Linzess 290 mcg capsule-Take 1 capsule daily CC: Dr Sandford Craze

## 2011-12-14 LAB — CELIAC PANEL 10
Endomysial Screen: NEGATIVE
Gliadin IgA: 29.9 U/mL — ABNORMAL HIGH (ref <20)
Gliadin IgG: 8 U/mL (ref <20)
IgA: 324 mg/dL (ref 69–380)
Tissue Transglut Ab: 10.3 U/mL (ref <20)
Tissue Transglutaminase Ab, IgA: 12.2 U/mL (ref <20)

## 2011-12-22 ENCOUNTER — Other Ambulatory Visit: Payer: 59

## 2011-12-22 DIAGNOSIS — K5900 Constipation, unspecified: Secondary | ICD-10-CM

## 2011-12-22 DIAGNOSIS — R109 Unspecified abdominal pain: Secondary | ICD-10-CM

## 2011-12-26 LAB — STOOL CULTURE

## 2011-12-27 ENCOUNTER — Other Ambulatory Visit: Payer: Self-pay

## 2011-12-27 DIAGNOSIS — A044 Other intestinal Escherichia coli infections: Secondary | ICD-10-CM

## 2012-01-02 ENCOUNTER — Ambulatory Visit: Payer: 59 | Admitting: Internal Medicine

## 2012-01-10 ENCOUNTER — Ambulatory Visit (INDEPENDENT_AMBULATORY_CARE_PROVIDER_SITE_OTHER): Payer: 59 | Admitting: Family

## 2012-01-10 ENCOUNTER — Encounter: Payer: Self-pay | Admitting: Family

## 2012-01-10 VITALS — BP 110/72 | HR 85 | Temp 98.0°F | Resp 18 | Ht 64.0 in | Wt 203.1 lb

## 2012-01-10 DIAGNOSIS — K219 Gastro-esophageal reflux disease without esophagitis: Secondary | ICD-10-CM

## 2012-01-10 DIAGNOSIS — R109 Unspecified abdominal pain: Secondary | ICD-10-CM

## 2012-01-10 MED ORDER — DEXLANSOPRAZOLE 60 MG PO CPDR: 60.0000 mg | DELAYED_RELEASE_CAPSULE | Freq: Every day | ORAL | Status: DC

## 2012-01-10 NOTE — Assessment & Plan Note (Signed)
Hoarseness  May be related to running out of dexilant.  #21 tabs samples provided.

## 2012-01-10 NOTE — Progress Notes (Signed)
Subjective:    Patient ID: [name], female    DOB: 05/06/1966, 46 y.o.   MRN: [id number]  HPI  [name] is a 46 yr old female who presents today for follow up of her abdominal pain.  At one time she was having problems with recurrent diarrhea.  She is now having issues with ongoing constipation. Patient has been working with Dr. Jarold Motto and has had an extensive GI work up.Abdominal pain- reports that 7/2 she was told that she had e-coli in her stool and was referred to ID.  Then told that she did not have e-coli and to cancel the ID visit.  She has scheduled apt with Dr. Loreta Ave later this week for a second opinion.   Pt tells me that she conitnues to have pains in the right side of her abdomen which radiates around to her back.  Wonders if it may be stress.  (dad recently diagnosed with esophageal cancer) She has some hoarseness- ran out of dexilant about 5 days ago.      Review of Systems See HPI  Past Medical History  Diagnosis Date  . Arthritis   . Overweight   . Anemia   . Personal history of colonic polyps 09/19/2011    tubular adenoma  . GERD (gastroesophageal reflux disease)   . Segmental colitis   . Irritable bowel syndrome     History   Social History  . Marital Status: Married    Spouse Name: N/A    Number of Children: 2  . Years of Education: N/A   Occupational History  . Disabled    Social History Main Topics  . Smoking status: Never Smoker   . Smokeless tobacco: Never Used  . Alcohol Use: No  . Drug Use: No  . Sexually Active: Not on file   Other Topics Concern  . Not on file   Social History Narrative   Regular exercise:  NoCaffeine Use:  Occasional sweet teaShe is legally separated2 children ages 34 year old son and 8 year old daughter.  Both children.Previously worked in Engineering geologist, now disabled.Completed 2 yrs of college.      Past Surgical History  Procedure Date  . Spine surgery 10/13/06    Posterior interbody fusion (PLIF) L5-S1 (07/04/07)    . Knee surgery      Right knee arthroscopic partial lateral meniscectomy (01/22/07),arthroscopic partial lateral meniscectomy 5/09  . Fracture surgery 2011    Due to injury at work in 2008.  . Facet joint injection 03/28/11    injections in neck    Family History  Problem Relation Age of Onset  . Asthma Mother   . Cancer Mother     gastric tumor  . Glaucoma Mother   . Hyperlipidemia Mother   . Hypertension Maternal Aunt   . COPD Mother     No Known Allergies  Current Outpatient Prescriptions on File Prior to Visit  Medication Sig Dispense Refill  . bifidobacterium infantis (ALIGN) capsule Take 1 capsule by mouth daily.  21 capsule  0  . Calcium Carbonate-Vitamin D (CALTRATE 600+D) 600-400 MG-UNIT per tablet Take 1 tablet by mouth 2 (two) times daily.        . clidinium-chlordiazePOXIDE (LIBRAX) 2.5-5 MG per capsule Take 1 capsule by mouth 3 (three) times daily as needed.      . Hydrocodone-Acetaminophen (NORCO PO) Take by mouth as needed.       . Linaclotide (LINZESS) 290 MCG CAPS Take 1 capsule by mouth daily.  16 capsule  0  . methocarbamol (ROBAXIN) 500 MG tablet Take 500 mg by mouth 3 (three) times daily.      . Psyllium (METAMUCIL PO) Take by mouth as directed.      Marland Kitchen DISCONTD: dexlansoprazole (DEXILANT) 60 MG capsule Take 60 mg by mouth daily.      Marland Kitchen DISCONTD: omeprazole (PRILOSEC) 20 MG capsule Take 20 mg by mouth daily.        BP 110/72  Pulse 85  Temp 98 F (36.7 C) (Oral)  Resp 18  Ht 5\' 4"  (1.626 m)  Wt 203 lb 1.3 oz (92.116 kg)  BMI 34.86 kg/m2  SpO2 99%  LMP 02/25/2009       Objective:   Physical Exam  Constitutional: She appears well-developed and well-nourished. No distress.  Cardiovascular: Normal rate and regular rhythm.   No murmur heard. Pulmonary/Chest: Effort normal and breath sounds normal. No respiratory distress. She has no wheezes. She has no rales. She exhibits no tenderness.  Abdominal: Soft. Bowel sounds are normal.       Mild right  lateral abdominal tenderness without guarding.           Assessment & Plan:  25 minutes spent with the pt today.  >50% of this time was spent counseling pt on her GI concerns/evaluation.

## 2012-01-10 NOTE — Assessment & Plan Note (Signed)
I suspect IBS.  She has appointment with Dr. Lynnell Grain 7/18 which I have encouraged her to keep.  Pertinent GI records were printed and provided to the pt to bring to her GI apt.

## 2012-01-10 NOTE — Patient Instructions (Addendum)
Please keep your upcoming appointment with Dr. Loreta Ave. Follow up in 3 months.

## 2012-04-09 ENCOUNTER — Ambulatory Visit: Payer: 59 | Admitting: Family

## 2012-04-18 ENCOUNTER — Ambulatory Visit (INDEPENDENT_AMBULATORY_CARE_PROVIDER_SITE_OTHER): Payer: 59 | Admitting: Family

## 2012-04-18 ENCOUNTER — Encounter: Payer: Self-pay | Admitting: Family

## 2012-04-18 VITALS — BP 130/80 | HR 96 | Temp 98.2°F | Resp 16 | Ht 64.0 in | Wt 205.0 lb

## 2012-04-18 DIAGNOSIS — M5090 Cervical disc disorder, unspecified, unspecified cervical region: Secondary | ICD-10-CM

## 2012-04-18 DIAGNOSIS — R5383 Other fatigue: Secondary | ICD-10-CM

## 2012-04-18 DIAGNOSIS — IMO0001 Reserved for inherently not codable concepts without codable children: Secondary | ICD-10-CM

## 2012-04-18 DIAGNOSIS — K589 Irritable bowel syndrome without diarrhea: Secondary | ICD-10-CM

## 2012-04-18 DIAGNOSIS — R5381 Other malaise: Secondary | ICD-10-CM

## 2012-04-18 DIAGNOSIS — M797 Fibromyalgia: Secondary | ICD-10-CM

## 2012-04-18 DIAGNOSIS — M2550 Pain in unspecified joint: Secondary | ICD-10-CM

## 2012-04-18 DIAGNOSIS — K219 Gastro-esophageal reflux disease without esophagitis: Secondary | ICD-10-CM

## 2012-04-18 DIAGNOSIS — E039 Hypothyroidism, unspecified: Secondary | ICD-10-CM

## 2012-04-18 MED ORDER — DULOXETINE HCL 30 MG PO CPEP: 60.0000 mg | ORAL_CAPSULE | Freq: Every day | ORAL | Status: DC

## 2012-04-18 NOTE — Patient Instructions (Addendum)
Please start cymbalta 30 mg (one cap) once daily for 1 week, then increase to 60mg  (2 caps) once daily on week two. Please complete blood work prior to leaving.  Please follow up in 1 month.

## 2012-04-18 NOTE — Progress Notes (Signed)
Subjective:    Patient ID: [name], female    DOB: March 22, 1966, 46 y.o.   MRN: [id number]  HPI  [name] is a 46 yr old female who presents today for follow up:  1) Abdominal pain- Patient met with Dr. Loreta Ave for further evaluation. She was diagnosed with IBS. She reports that she is feeling much better. She is now using librax and linzess.    2) Joint pain- she reports both muscle pain and joint pain.  Reports that she is very stiff in the AM.  3) GERD- Stopped dexilant. Reports that her GERD symptoms remain well controlled.     Review of Systems See HPI  Past Medical History  Diagnosis Date  . Arthritis   . Overweight   . Anemia   . Personal history of colonic polyps 09/19/2011    tubular adenoma  . GERD (gastroesophageal reflux disease)   . Segmental colitis   . Irritable bowel syndrome     History   Social History  . Marital Status: Married    Spouse Name: N/A    Number of Children: 2  . Years of Education: N/A   Occupational History  . Disabled    Social History Main Topics  . Smoking status: Never Smoker   . Smokeless tobacco: Never Used  . Alcohol Use: No  . Drug Use: No  . Sexually Active: Not on file   Other Topics Concern  . Not on file   Social History Narrative   Regular exercise:  NoCaffeine Use:  Occasional sweet teaShe is legally separated2 children ages 58 year old son and 87 year old daughter.  Both children.Previously worked in Engineering geologist, now disabled.Completed 2 yrs of college.      Past Surgical History  Procedure Date  . Spine surgery 10/13/06    Posterior interbody fusion (PLIF) L5-S1 (07/04/07)  . Knee surgery      Right knee arthroscopic partial lateral meniscectomy (01/22/07),arthroscopic partial lateral meniscectomy 5/09  . Fracture surgery 2011    Due to injury at work in 2008.  . Facet joint injection 03/28/11    injections in neck    Family History  Problem Relation Age of Onset  . Asthma Mother   . Cancer Mother    gastric tumor  . Glaucoma Mother   . Hyperlipidemia Mother   . Hypertension Maternal Aunt   . COPD Mother     No Known Allergies  Current Outpatient Prescriptions on File Prior to Visit  Medication Sig Dispense Refill  . Calcium Carbonate-Vitamin D (CALTRATE 600+D) 600-400 MG-UNIT per tablet Take 1 tablet by mouth 2 (two) times daily.        . clidinium-chlordiazePOXIDE (LIBRAX) 2.5-5 MG per capsule Take 1 capsule by mouth 3 (three) times daily as needed.      . Hydrocodone-Acetaminophen (NORCO PO) Take by mouth as needed.       . Linaclotide (LINZESS) 290 MCG CAPS Take 1 capsule by mouth daily.  16 capsule  0  . methocarbamol (ROBAXIN) 500 MG tablet Take 500 mg by mouth 3 (three) times daily.      . Psyllium (METAMUCIL PO) Take by mouth as directed.      Marland Kitchen dexlansoprazole (DEXILANT) 60 MG capsule Take 1 capsule (60 mg total) by mouth daily.  30 capsule  2  . DULoxetine (CYMBALTA) 30 MG capsule Take 2 capsules (60 mg total) by mouth daily.  60 capsule  0  . DISCONTD: omeprazole (PRILOSEC) 20 MG capsule Take  20 mg by mouth daily.        BP 130/80  Pulse 96  Temp 98.2 F (36.8 C) (Oral)  Resp 16  Ht 5\' 4"  (1.626 m)  Wt 205 lb (92.987 kg)  BMI 35.19 kg/m2  SpO2 95%  LMP 02/25/2009       Objective:   Physical Exam  Constitutional: She appears well-developed and well-nourished. No distress.  Cardiovascular: Normal rate and regular rhythm.   No murmur heard. Pulmonary/Chest: Effort normal and breath sounds normal. No respiratory distress. She has no wheezes. She has no rales. She exhibits no tenderness.  Musculoskeletal: She exhibits no edema.       Tender points tested for FM- pt is tender in most of the tender points.   Psychiatric: She has a normal mood and affect. Her speech is normal and behavior is normal. Judgment and thought content normal. Cognition and memory are normal.          Assessment & Plan:

## 2012-04-19 DIAGNOSIS — M797 Fibromyalgia: Secondary | ICD-10-CM | POA: Insufficient documentation

## 2012-04-19 DIAGNOSIS — K589 Irritable bowel syndrome without diarrhea: Secondary | ICD-10-CM | POA: Insufficient documentation

## 2012-04-19 DIAGNOSIS — M2550 Pain in unspecified joint: Secondary | ICD-10-CM | POA: Insufficient documentation

## 2012-04-19 LAB — ANA: Anti Nuclear Antibody(ANA): POSITIVE — AB

## 2012-04-19 NOTE — Assessment & Plan Note (Signed)
Stable off of dexilant. Monitor.

## 2012-04-19 NOTE — Assessment & Plan Note (Signed)
Improved. Management per GI.  

## 2012-04-19 NOTE — Assessment & Plan Note (Signed)
Obtain ANA, ESR, RA to exclude underlying autoimmune etiology.

## 2012-04-19 NOTE — Assessment & Plan Note (Signed)
The pt follows with Dr. Rochele Pages. Defer management to Dr. Rochele Pages.

## 2012-04-19 NOTE — Assessment & Plan Note (Signed)
I suspect fibromyalgia based on her complaints and + tender points. Will give trial of cymbalta to see if this helps with her pain.

## 2012-04-24 ENCOUNTER — Telehealth: Payer: Self-pay | Admitting: *Deleted

## 2012-04-24 DIAGNOSIS — M2550 Pain in unspecified joint: Secondary | ICD-10-CM

## 2012-04-24 NOTE — Telephone Encounter (Signed)
Received fax from [facility] requesting prior auth for pt's Cymbaltal 30mg  2 capsules daily. Pharmacy suggests trying 60mg  1 capsule daily before trying for quantity limit exception with 30mg  capsules.  Please advise.

## 2012-04-24 NOTE — Telephone Encounter (Signed)
Please send rx for Cymbalta 60mg  once daily #30 to her pharmacy. Please notify pt that  I would like pt to start 30mg  once daily for 7 days (#7 tabs samples available for pt to pick up). Then she can increase to 60mg  once daily.

## 2012-04-24 NOTE — Telephone Encounter (Signed)
Spoke to pt re: lab work, (+ ANA with neg titer, likely false positive), neg rheumatoid factor. Mild elevated sed rate.  Will refer to Rheumatology for further evaluation in the setting of joint pain.  Pt picked up a month supply of the 30mg  tabs.  We discussed that we will plan to rx with 60 mg tabs going forward. No need to provide sample. Will re-order cymbalta 60 at her follow up visit.

## 2012-05-16 ENCOUNTER — Ambulatory Visit: Payer: 59 | Admitting: Family

## 2012-06-19 ENCOUNTER — Ambulatory Visit: Payer: 59 | Admitting: Family

## 2012-07-04 ENCOUNTER — Ambulatory Visit: Payer: 59 | Admitting: Family

## 2012-07-04 DIAGNOSIS — Z0289 Encounter for other administrative examinations: Secondary | ICD-10-CM

## 2012-07-06 ENCOUNTER — Other Ambulatory Visit: Payer: Self-pay | Admitting: Gastroenterology

## 2012-07-06 NOTE — Telephone Encounter (Signed)
Would you like to renew her Librax?

## 2012-11-05 ENCOUNTER — Telehealth: Payer: Self-pay | Admitting: Gastroenterology

## 2012-11-05 ENCOUNTER — Other Ambulatory Visit: Payer: Self-pay | Admitting: Gastroenterology

## 2012-11-05 NOTE — Telephone Encounter (Signed)
I CALLED [facility] AND SPOKE WITH LISA ADVISED LISA WE USED GENERIC BEFORE SO OK NOW

## 2012-11-05 NOTE — Telephone Encounter (Signed)
PATIENT WILL NEED AN OFFICE VISIT FOR FURTHER REFILLS  

## 2012-11-05 NOTE — Telephone Encounter (Signed)
Wants to know if they can use the generic of Librax.

## 2012-12-26 ENCOUNTER — Telehealth: Payer: Self-pay | Admitting: *Deleted

## 2012-12-26 ENCOUNTER — Other Ambulatory Visit: Payer: Self-pay | Admitting: Gastroenterology

## 2012-12-26 NOTE — Telephone Encounter (Signed)
Sent RX  Called patient to make a follow up visit, left message for call back

## 2012-12-26 NOTE — Telephone Encounter (Signed)
Refill x 1 and ask her to make f/u appt

## 2012-12-26 NOTE — Telephone Encounter (Signed)
Do we refill this? 

## 2012-12-26 NOTE — Telephone Encounter (Signed)
Message copied by Arline Asp on Wed Dec 26, 2012  1:50 PM ------      Message from: HAZELWOOD, AMY L      Created: Wed Dec 26, 2012  1:41 PM       Returning your call       [phone]            Thanks      Amy ------

## 2012-12-26 NOTE — Telephone Encounter (Signed)
Patient made follow up appointment

## 2013-01-17 ENCOUNTER — Ambulatory Visit (INDEPENDENT_AMBULATORY_CARE_PROVIDER_SITE_OTHER): Payer: 59 | Admitting: Gastroenterology

## 2013-01-17 ENCOUNTER — Encounter: Payer: Self-pay | Admitting: Gastroenterology

## 2013-01-17 VITALS — BP 108/66 | HR 88 | Ht 63.0 in | Wt 202.0 lb

## 2013-01-17 DIAGNOSIS — K219 Gastro-esophageal reflux disease without esophagitis: Secondary | ICD-10-CM

## 2013-01-17 DIAGNOSIS — K589 Irritable bowel syndrome without diarrhea: Secondary | ICD-10-CM

## 2013-01-17 DIAGNOSIS — K5900 Constipation, unspecified: Secondary | ICD-10-CM

## 2013-01-17 MED ORDER — DICYCLOMINE HCL 10 MG PO CAPS: 10.0000 mg | ORAL_CAPSULE | Freq: Three times a day (TID) | ORAL | Status: DC

## 2013-01-17 MED ORDER — DEXLANSOPRAZOLE 60 MG PO CPDR: 60.0000 mg | DELAYED_RELEASE_CAPSULE | Freq: Every day | ORAL | Status: DC

## 2013-01-17 MED ORDER — LINACLOTIDE 290 MCG PO CAPS: 1.0000 | ORAL_CAPSULE | Freq: Every day | ORAL | Status: DC

## 2013-01-17 NOTE — Patient Instructions (Addendum)
We have sent the following medications to your pharmacy for you to pick up at your convenience: Dexilant Linzess Bentyl (take in place of Librax)  Please purchase the following medications over the counter and take as directed: Benefiber-1 tablespoon twice daily   High-Fiber Diet Fiber is found in fruits, vegetables, and grains. A high-fiber diet encourages the addition of more whole grains, legumes, fruits, and vegetables in your diet. The recommended amount of fiber for adult males is 38 g per day. For adult females, it is 25 g per day. Pregnant and lactating women should get 28 g of fiber per day. If you have a digestive or bowel problem, ask your caregiver for advice before adding high-fiber foods to your diet. Eat a variety of high-fiber foods instead of only a select few type of foods.  PURPOSE  To increase stool bulk.  To make bowel movements more regular to prevent constipation.  To lower cholesterol.  To prevent overeating. WHEN IS THIS DIET USED?  It may be used if you have constipation and hemorrhoids.  It may be used if you have uncomplicated diverticulosis (intestine condition) and irritable bowel syndrome.  It may be used if you need help with weight management.  It may be used if you want to add it to your diet as a protective measure against atherosclerosis, diabetes, and cancer. SOURCES OF FIBER  Whole-grain breads and cereals.  Fruits, such as apples, oranges, bananas, berries, prunes, and pears.  Vegetables, such as green peas, carrots, sweet potatoes, beets, broccoli, cabbage, spinach, and artichokes.  Legumes, such split peas, soy, lentils.  Almonds. FIBER CONTENT IN FOODS Starches and Grains / Dietary Fiber (g)  Cheerios, 1 cup / 3 g  Corn Flakes cereal, 1 cup / 0.7 g  Rice crispy treat cereal, 1 cup / 0.3 g  Instant oatmeal (cooked),  cup / 2 g  Frosted wheat cereal, 1 cup / 5.1 g  Brown, long-grain rice (cooked), 1 cup / 3.5 g  White,  long-grain rice (cooked), 1 cup / 0.6 g  Enriched macaroni (cooked), 1 cup / 2.5 g Legumes / Dietary Fiber (g)  Baked beans (canned, plain, or vegetarian),  cup / 5.2 g  Kidney beans (canned),  cup / 6.8 g  Pinto beans (cooked),  cup / 5.5 g Breads and Crackers / Dietary Fiber (g)  Plain or honey graham crackers, 2 squares / 0.7 g  Saltine crackers, 3 squares / 0.3 g  Plain, salted pretzels, 10 pieces / 1.8 g  Whole-wheat bread, 1 slice / 1.9 g  White bread, 1 slice / 0.7 g  Raisin bread, 1 slice / 1.2 g  Plain bagel, 3 oz / 2 g  Flour tortilla, 1 oz / 0.9 g  Corn tortilla, 1 small / 1.5 g  Hamburger or hotdog bun, 1 small / 0.9 g Fruits / Dietary Fiber (g)  Apple with skin, 1 medium / 4.4 g  Sweetened applesauce,  cup / 1.5 g  Banana,  medium / 1.5 g  Grapes, 10 grapes / 0.4 g  Orange, 1 small / 2.3 g  Raisin, 1.5 oz / 1.6 g  Melon, 1 cup / 1.4 g Vegetables / Dietary Fiber (g)  Green beans (canned),  cup / 1.3 g  Carrots (cooked),  cup / 2.3 g  Broccoli (cooked),  cup / 2.8 g  Peas (cooked),  cup / 4.4 g  Mashed potatoes,  cup / 1.6 g  Lettuce, 1 cup / 0.5 g  Corn (  canned),  cup / 1.6 g  Tomato,  cup / 1.1 g Document Released: 06/13/2005 Document Revised: 12/13/2011 Document Reviewed: 09/15/2011 [facility] Patient Information 2014 [place], [place].   CC: Dr Sandford Craze

## 2013-01-17 NOTE — Progress Notes (Signed)
This is a 47 year old African American female with postinfectious IBS, constipation predominant.  She continues to have some gas and bloating but denies lactose intolerance.  She since her last visit salt a second opinion from Dr. Ranae Palms, who apparently also confirmed IBS.  The patient has responded to Librax, Dexilant, and lesions as for constipation.  She denies significant pain or other problems at this time.  Current Medications, Allergies, Past Medical History, Past Surgical History, Family History and Social History were reviewed in [employer] record.  ROS: All systems were reviewed and are negative unless otherwise stated in the HPI.          Physical Exam: Blood pressure 108/66, pulse 88 and regular and weight 202 with a BMI of 35.79.  Abdominal exam is entirely unremarkable except for exogenous obesity.  Mental status is normal    Assessment and Plan: Constipation predominant IBS with elements of acid reflux and a recent negative endoscopy and negative biopsies for celiac disease and H. pylori.  We will change her from Librax to Bentyl 10 mg 3 times a day as tolerated, continue her Dexilant and I have added Benefiber 1 tablespoon twice a day to her Linzess 290 mcg  Therapy for her constipation.  We'll see her on a when necessary basis with ongoing primary care followup  No diagnosis found.

## 2013-01-18 ENCOUNTER — Telehealth: Payer: Self-pay

## 2013-01-18 NOTE — Telephone Encounter (Signed)
Spoke to patient and confirmed address for a prior authorization request for Linzess. She is going to call [facility] on Monday and give them the current address.  Once she does that I can fax the prior authorization form back to [facility].  She states she has tried no other drugs for her constipation.  She does plan to get the Benefiber that Dr. Jarold Motto suggested.    She states that the Bentyl rx is too expensive $ 196.00 so she request that be changed back to the Librax which cost her $70.00.  I will route this note to Dr. Jarold Motto to get approval for this.

## 2013-01-21 ENCOUNTER — Telehealth: Payer: Self-pay | Admitting: *Deleted

## 2013-01-21 MED ORDER — CILIDINIUM-CHLORDIAZEPOXIDE 2.5-5 MG PO CAPS: 1.0000 | ORAL_CAPSULE | Freq: Three times a day (TID) | ORAL | Status: DC | PRN

## 2013-01-21 MED ORDER — LUBIPROSTONE 8 MCG PO CAPS: 8.0000 ug | ORAL_CAPSULE | Freq: Two times a day (BID) | ORAL | Status: DC

## 2013-01-21 NOTE — Telephone Encounter (Signed)
Per Dr. Jarold Motto ok to switch back to Librax due to the high cost of Bentyl.  Left message on patients voice mail that this was sent to pharmacy and also that amitiza sent in to try in place of the Linzess which requires a prior authorization that would require her to try and fail amitiza before Linzess would be approved.  Mailed coupon card for Amitiza to patient, will go out tomorrow.

## 2013-01-21 NOTE — Telephone Encounter (Signed)
Prior auth faxed over for [name]  I called [phone] I spoke with Mickle Mallory and [name] was approved til 06-2013 Approval code is: [id number]  Left patient a message and pharmacy was notified

## 2013-05-21 ENCOUNTER — Other Ambulatory Visit: Payer: Self-pay | Admitting: Gastroenterology

## 2013-06-03 DIAGNOSIS — I10 Essential (primary) hypertension: Secondary | ICD-10-CM | POA: Diagnosis not present

## 2013-06-03 DIAGNOSIS — G8929 Other chronic pain: Secondary | ICD-10-CM | POA: Diagnosis not present

## 2013-06-03 DIAGNOSIS — R5381 Other malaise: Secondary | ICD-10-CM | POA: Diagnosis not present

## 2013-06-03 DIAGNOSIS — J019 Acute sinusitis, unspecified: Secondary | ICD-10-CM | POA: Diagnosis not present

## 2013-06-28 ENCOUNTER — Encounter: Payer: Self-pay | Admitting: Physician Assistant

## 2013-06-28 ENCOUNTER — Ambulatory Visit (HOSPITAL_BASED_OUTPATIENT_CLINIC_OR_DEPARTMENT_OTHER)
Admission: RE | Admit: 2013-06-28 | Discharge: 2013-06-28 | Disposition: A | Discharge status: Home / Self Care | Payer: 59 | Source: Ambulatory Visit | Attending: Physician Assistant | Admitting: Physician Assistant

## 2013-06-28 ENCOUNTER — Ambulatory Visit (INDEPENDENT_AMBULATORY_CARE_PROVIDER_SITE_OTHER): Payer: 59 | Admitting: Physician Assistant

## 2013-06-28 ENCOUNTER — Other Ambulatory Visit: Payer: Self-pay | Admitting: Gastroenterology

## 2013-06-28 VITALS — BP 124/74 | HR 82 | Temp 98.2°F | Resp 16 | Ht 63.0 in | Wt 203.2 lb

## 2013-06-28 DIAGNOSIS — M25529 Pain in unspecified elbow: Secondary | ICD-10-CM | POA: Insufficient documentation

## 2013-06-28 DIAGNOSIS — M25521 Pain in right elbow: Secondary | ICD-10-CM

## 2013-06-28 MED ORDER — METHYLPREDNISOLONE (PAK) 4 MG PO TABS: ORAL_TABLET | ORAL | Status: DC

## 2013-06-28 NOTE — Progress Notes (Signed)
Pre visit review using our clinic review tool, if applicable. No additional management support is needed unless otherwise documented below in the visit note/SLS  

## 2013-06-28 NOTE — Patient Instructions (Signed)
Please continue to wear elbow brace.  Take steroid dose pack as prescribed.  Can take Norco for pain.  Avoid Ibuprofen and Aleve while on steroid.  You will be contacted by an [place] office for an appointment.

## 2013-06-30 DIAGNOSIS — M25521 Pain in right elbow: Secondary | ICD-10-CM | POA: Insufficient documentation

## 2013-06-30 NOTE — Assessment & Plan Note (Signed)
Will obtain x-ray. Patient has elbow brief at home. Instructed patient to wear elbow brace. Rx Medrol Dosepak. Topical Aspercreme or salon pas to affected area. Warm compresses. Avoid heavy lifting or over use. If symptoms persist, may need to be evaluated by orthopedist.

## 2013-06-30 NOTE — Progress Notes (Signed)
Patient ID: [name], female   DOB: Dec 14, 1965, 48 y.o.   MRN: [id number]  Patient presents to clinic today complaining of pain in right elbow that radiates to arm and hand that has been present intermittently past 2 months. Patient states that her symptoms have returned over the past week and have worsened over the past couple of days. Patient does endorse occasional numbness in tingling of fingers. Denies weakness of right upper extremity. Patient does endorse occasional shoulder pain. Denies decrease in range of motion. Denies trauma or injury. Patient denies neck pain. Patient fever, swelling or warmth affected regions. Has taken occasional ibuprofen for symptoms.  Past Medical History  Diagnosis Date  . Arthritis   . Overweight(278.02)   . Anemia   . Personal history of colonic polyps 09/19/2011    tubular adenoma  . GERD (gastroesophageal reflux disease)   . Segmental colitis   . Irritable bowel syndrome     Current Outpatient Prescriptions on File Prior to Visit  Medication Sig Dispense Refill  . Calcium Carbonate-Vitamin D (CALTRATE 600+D) 600-400 MG-UNIT per tablet Take 1 tablet by mouth 2 (two) times daily.        . clidinium-chlordiazePOXIDE (LIBRAX) 2.5-5 MG per capsule Take 1 capsule by mouth 3 (three) times daily as needed.  90 capsule  0  . Hydrocodone-Acetaminophen (NORCO PO) Take by mouth as needed.       . methocarbamol (ROBAXIN) 500 MG tablet Take 500 mg by mouth 3 (three) times daily.      . [DISCONTINUED] omeprazole (PRILOSEC) 20 MG capsule Take 20 mg by mouth daily.       No current facility-administered medications on file prior to visit.    No Known Allergies  Family History  Problem Relation Age of Onset  . Asthma Mother   . Cancer Mother     gastric tumor  . Glaucoma Mother   . Hyperlipidemia Mother   . Hypertension Maternal Aunt   . COPD Mother     History   Social History  . Marital Status: Married    Spouse Name: N/A    Number of Children:  2  . Years of Education: N/A   Occupational History  . Disabled    Social History Main Topics  . Smoking status: Never Smoker   . Smokeless tobacco: Never Used  . Alcohol Use: No  . Drug Use: No  . Sexual Activity: None   Other Topics Concern  . None   Social History Narrative   Regular exercise:  No   Caffeine Use:  Occasional sweet tea   She is legally separated   2 children ages 62 year old son and 14 year old daughter.  Both children.   Previously worked in Scientist, research (medical), now disabled.   Completed 2 yrs of college.      Review of Systems - See HPI.  All other ROS are negative.  Filed Vitals:   06/28/13 0954  BP: 124/74  Pulse: 82  Temp: 99.2 F (37.3 C)  Resp: 16   Physical Exam  Vitals reviewed. Constitutional: She is oriented to person, place, and time and well-developed, well-nourished, and in no distress.  HENT:  Head: Normocephalic and atraumatic.  Eyes: Conjunctivae are normal. Pupils are equal, round, and reactive to light.  Neck: Neck supple.  Cardiovascular: Normal rate, regular rhythm, normal heart sounds and intact distal pulses.   Pulmonary/Chest: Effort normal and breath sounds normal. No respiratory distress. She has no wheezes. She has no rales.  She exhibits no tenderness.  Musculoskeletal:       Right shoulder: She exhibits pain. She exhibits normal range of motion, no tenderness, no bony tenderness, no swelling and no spasm.       Right elbow: She exhibits normal range of motion, no swelling, no effusion, no deformity and no laceration. Tenderness found.       Right wrist: Normal.       Right upper arm: Normal.       Right forearm: Normal.       Right hand: She exhibits normal range of motion, no tenderness, no bony tenderness, normal two-point discrimination, normal capillary refill, no deformity and no swelling. Decreased sensation noted. Normal strength noted.  Lymphadenopathy:    She has no cervical adenopathy.  Neurological: She is alert and  oriented to person, place, and time.  Skin: Skin is warm and dry. No rash noted.  Psychiatric: Affect normal.   Assessment/Plan: No problem-specific assessment & plan notes found for this encounter.

## 2013-07-02 DIAGNOSIS — I10 Essential (primary) hypertension: Secondary | ICD-10-CM | POA: Diagnosis not present

## 2013-07-02 DIAGNOSIS — R5381 Other malaise: Secondary | ICD-10-CM | POA: Diagnosis not present

## 2013-07-02 DIAGNOSIS — M79609 Pain in unspecified limb: Secondary | ICD-10-CM | POA: Diagnosis not present

## 2013-07-02 DIAGNOSIS — R5383 Other fatigue: Secondary | ICD-10-CM | POA: Diagnosis not present

## 2013-07-02 DIAGNOSIS — G43109 Migraine with aura, not intractable, without status migrainosus: Secondary | ICD-10-CM | POA: Diagnosis not present

## 2013-07-04 ENCOUNTER — Other Ambulatory Visit: Payer: Self-pay | Admitting: Family

## 2013-07-04 DIAGNOSIS — Z1231 Encounter for screening mammogram for malignant neoplasm of breast: Secondary | ICD-10-CM

## 2013-07-12 ENCOUNTER — Ambulatory Visit (HOSPITAL_BASED_OUTPATIENT_CLINIC_OR_DEPARTMENT_OTHER): Payer: 59

## 2013-07-15 ENCOUNTER — Ambulatory Visit (HOSPITAL_BASED_OUTPATIENT_CLINIC_OR_DEPARTMENT_OTHER)
Admission: RE | Admit: 2013-07-15 | Discharge: 2013-07-15 | Disposition: A | Discharge status: Home / Self Care | Payer: 59 | Source: Ambulatory Visit | Attending: Family | Admitting: Family

## 2013-07-15 DIAGNOSIS — Z1231 Encounter for screening mammogram for malignant neoplasm of breast: Secondary | ICD-10-CM | POA: Insufficient documentation

## 2013-07-18 ENCOUNTER — Ambulatory Visit: Payer: 59 | Admitting: Gastroenterology

## 2013-08-02 DIAGNOSIS — R5381 Other malaise: Secondary | ICD-10-CM | POA: Diagnosis not present

## 2013-08-02 DIAGNOSIS — G43109 Migraine with aura, not intractable, without status migrainosus: Secondary | ICD-10-CM | POA: Diagnosis not present

## 2013-08-02 DIAGNOSIS — G8929 Other chronic pain: Secondary | ICD-10-CM | POA: Diagnosis not present

## 2013-08-04 ENCOUNTER — Other Ambulatory Visit: Payer: Self-pay | Admitting: Gastroenterology

## 2013-08-12 ENCOUNTER — Telehealth: Payer: Self-pay | Admitting: Family

## 2013-08-12 ENCOUNTER — Encounter: Payer: Self-pay | Admitting: Physician Assistant

## 2013-08-12 ENCOUNTER — Ambulatory Visit (INDEPENDENT_AMBULATORY_CARE_PROVIDER_SITE_OTHER): Payer: 59 | Admitting: Physician Assistant

## 2013-08-12 VITALS — BP 114/87 | HR 77 | Temp 98.5°F | Resp 16 | Ht 63.0 in | Wt 211.1 lb

## 2013-08-12 DIAGNOSIS — K219 Gastro-esophageal reflux disease without esophagitis: Secondary | ICD-10-CM

## 2013-08-12 DIAGNOSIS — R10816 Epigastric abdominal tenderness: Secondary | ICD-10-CM

## 2013-08-12 DIAGNOSIS — R109 Unspecified abdominal pain: Secondary | ICD-10-CM

## 2013-08-12 LAB — CBC WITH DIFFERENTIAL/PLATELET
BASOS ABS: 0 10*3/uL (ref 0.0–0.1)
Basophils Relative: 0 % (ref 0–1)
EOS ABS: 0.2 10*3/uL (ref 0.0–0.7)
Eosinophils Relative: 3 % (ref 0–5)
HCT: 34.8 % — ABNORMAL LOW (ref 36.0–46.0)
Hemoglobin: 11.9 g/dL — ABNORMAL LOW (ref 12.0–15.0)
LYMPHS ABS: 2.3 10*3/uL (ref 0.7–4.0)
Lymphocytes Relative: 33 % (ref 12–46)
MCH: 29.7 pg (ref 26.0–34.0)
MCHC: 34.2 g/dL (ref 30.0–36.0)
MCV: 86.8 fL (ref 78.0–100.0)
Monocytes Absolute: 0.6 10*3/uL (ref 0.1–1.0)
Monocytes Relative: 8 % (ref 3–12)
NEUTROS PCT: 56 % (ref 43–77)
Neutro Abs: 3.9 10*3/uL (ref 1.7–7.7)
PLATELETS: 468 10*3/uL — AB (ref 150–400)
RBC: 4.01 MIL/uL (ref 3.87–5.11)
RDW: 13.5 % (ref 11.5–15.5)
WBC: 7.1 10*3/uL (ref 4.0–10.5)

## 2013-08-12 LAB — COMPREHENSIVE METABOLIC PANEL
ALT: 9 U/L (ref 0–35)
AST: 17 U/L (ref 0–37)
Albumin: 4 g/dL (ref 3.5–5.2)
Alkaline Phosphatase: 76 U/L (ref 39–117)
BILIRUBIN TOTAL: 0.2 mg/dL (ref 0.2–1.2)
BUN: 7 mg/dL (ref 6–23)
CO2: 29 mEq/L (ref 19–32)
Calcium: 8.9 mg/dL (ref 8.4–10.5)
Chloride: 101 mEq/L (ref 96–112)
Creat: 0.63 mg/dL (ref 0.50–1.10)
Glucose, Bld: 89 mg/dL (ref 70–99)
Potassium: 4 mEq/L (ref 3.5–5.3)
SODIUM: 136 meq/L (ref 135–145)
TOTAL PROTEIN: 6.8 g/dL (ref 6.0–8.3)

## 2013-08-12 LAB — LIPASE: Lipase: 45 U/L (ref 0–75)

## 2013-08-12 MED ORDER — GI COCKTAIL ~~LOC~~: 30.0000 mL | Freq: Once | ORAL | Status: DC

## 2013-08-12 NOTE — Progress Notes (Signed)
Pre visit review using our clinic review tool, if applicable. No additional management support is needed unless otherwise documented below in the visit note/SLS  

## 2013-08-12 NOTE — Telephone Encounter (Signed)
Patient Information:  Caller Name: Donella  Phone: [phone]  Patient: [name], [name]  Gender: Female  DOB: 01-09-1966  Age: 48 Years  PCP: Debbrah Alar (Adults only)  Pregnant: No  Office Follow Up:  Does the office need to follow up with this patient?: No  Instructions For The Office: N/A  RN Note:  No referred pain.  No shortness of breath, nausea or diaphoresis.  Pain is worse with deep breath or movement.  No cardiac risk factors per EMR. Per office scheduler, office nurse approved appointment since office is open.  Scheduled for 1300 08/12/14 at [facility] office with  Elyn Aquas PA.    Symptoms  Reason For Call & Symptoms: Substernal chest pain with "bad sharp pain in right shoulder blade like stabbing pain." Pain was rated 9/10; but now 3- 4/10 after Norco. Chest pain made worse when coughs or takes deep breath.  Reviewed Health History In EMR: Yes  Reviewed Medications In EMR: Yes  Reviewed Allergies In EMR: Yes  Reviewed Surgeries / Procedures: Yes  Date of Onset of Symptoms: 08/11/2013  Treatments Tried: Norco  Treatments Tried Worked: Yes OB / GYN:  LMP: Unknown  Guideline(s) Used:  Chest Pain  Disposition Per Guideline:   Go to ED Now (or to Office with PCP Approval)  Reason For Disposition Reached:   Chest pain lasting longer than 5 minutes  Advice Given:  Call Back If:  Severe chest pain  Constant chest pain lasting longer than 5 minutes  You become worse.  Patient Will Follow Care Advice:  YES

## 2013-08-12 NOTE — Patient Instructions (Signed)
Please obtain labs.  You will be contacted for Ultrasound of your abdomen.  I will call you with all of your results.  Please take GI cocktail as directed.  Restart dexilant tomorrow morning.  Alternate tylenol and ibuprofen for pain.  Please proceed to the ER if symptoms acutely worsen.

## 2013-08-13 LAB — H. PYLORI ANTIBODY, IGG: H Pylori IgG: 0.86 {ISR}

## 2013-08-16 ENCOUNTER — Ambulatory Visit (HOSPITAL_BASED_OUTPATIENT_CLINIC_OR_DEPARTMENT_OTHER)
Admission: RE | Admit: 2013-08-16 | Discharge: 2013-08-16 | Disposition: A | Discharge status: Home / Self Care | Payer: 59 | Source: Ambulatory Visit | Attending: Physician Assistant | Admitting: Physician Assistant

## 2013-08-16 DIAGNOSIS — R10816 Epigastric abdominal tenderness: Secondary | ICD-10-CM | POA: Insufficient documentation

## 2013-08-16 DIAGNOSIS — R109 Unspecified abdominal pain: Secondary | ICD-10-CM

## 2013-08-16 DIAGNOSIS — R1011 Right upper quadrant pain: Secondary | ICD-10-CM | POA: Insufficient documentation

## 2013-08-16 MED ORDER — DEXLANSOPRAZOLE 60 MG PO CPDR: 60.0000 mg | DELAYED_RELEASE_CAPSULE | Freq: Every day | ORAL | Status: DC

## 2013-08-16 NOTE — Progress Notes (Signed)
Patient presents to clinic today c/o epigastric pain and discomfort x 2 days.  Patient states the pain is associated with some discomfort in her chest but only when she is experiencing heartburn symptoms. Patient denies shortness of breath, lightheadedness, nausea or vomiting. Patient denies fever, chills, or other constitutional symptoms.  Patient denies dysphagia.  Endorses globus. Patient does have a history of moderate GERD for which Dexilant was the only medication that helped her symptoms.  Has a hx of esophageal spasm 2/2 uncontrolled GERD.  Patient has not taken Dexilant in several months due to cost.  Tries to watch what she eats. Patient denies recent increased alcohol consumption.  Denies history of gallstone.  Pain is not worsened with movement but is worsened with lying down.  Therefore, symptoms are worse at night.  Past Medical History  Diagnosis Date  . Arthritis   . Overweight   . Anemia   . Personal history of colonic polyps 09/19/2011    tubular adenoma  . GERD (gastroesophageal reflux disease)   . Segmental colitis   . Irritable bowel syndrome     Current Outpatient Prescriptions on File Prior to Visit  Medication Sig Dispense Refill  . Calcium Carbonate-Vitamin D (CALTRATE 600+D) 600-400 MG-UNIT per tablet Take 1 tablet by mouth 2 (two) times daily.        . clidinium-chlordiazePOXIDE (LIBRAX) 2.5-5 MG per capsule Take 1 capsule by mouth 3 (three) times daily as needed.  90 capsule  0  . dicyclomine (BENTYL) 10 MG capsule TAKE ONE CAPSULE BY MOUTH THREE TIMES DAILY  90 capsule  0  . Hydrocodone-Acetaminophen (NORCO PO) Take by mouth as needed.       . methocarbamol (ROBAXIN) 500 MG tablet Take 500 mg by mouth 3 (three) times daily.      . [DISCONTINUED] omeprazole (PRILOSEC) 20 MG capsule Take 20 mg by mouth daily.       No current facility-administered medications on file prior to visit.    No Known Allergies  Family History  Problem Relation Age of Onset  . Asthma  Mother   . Cancer Mother     gastric tumor  . Glaucoma Mother   . Hyperlipidemia Mother   . Hypertension Maternal Aunt   . COPD Mother     History   Social History  . Marital Status: Married    Spouse Name: N/A    Number of Children: 2  . Years of Education: N/A   Occupational History  . Disabled    Social History Main Topics  . Smoking status: Never Smoker   . Smokeless tobacco: Never Used  . Alcohol Use: No  . Drug Use: No  . Sexual Activity: None   Other Topics Concern  . None   Social History Narrative   Regular exercise:  No   Caffeine Use:  Occasional sweet tea   She is legally separated   2 children ages 49 year old son and 56 year old daughter.  Both children.   Previously worked in Scientist, research (medical), now disabled.   Completed 2 yrs of college.      Review of Systems - See HPI.  All other ROS are negative.  BP 114/87  Pulse 77  Temp(Src) 98.5 F (36.9 C) (Oral)  Resp 16  Ht $R'5\' 3"'dF$  (1.6 m)  Wt 211 lb 2 oz (95.766 kg)  BMI 37.41 kg/m2  SpO2 97%  LMP 02/25/2009  Physical Exam  Constitutional: She is oriented to person, place, and time and well-developed,  well-nourished, and in no distress.  HENT:  Head: Normocephalic and atraumatic.  Eyes: Conjunctivae are normal.  Neck: Neck supple.  Cardiovascular: Normal rate, regular rhythm, normal heart sounds and intact distal pulses.   Pulmonary/Chest: Effort normal and breath sounds normal. No respiratory distress. She has no wheezes. She has no rales. She exhibits no tenderness.  Abdominal: Soft. Bowel sounds are normal. She exhibits no distension and no mass. There is no rebound and no guarding.  + epigastric tenderness and mild sternal tenderness with deep palpation.  Musculoskeletal:  Pain is not reproducible with motion.  However there is pain noted with palpation over epigastrium and sternum.   Lymphadenopathy:    She has no cervical adenopathy.  Neurological: She is alert and oriented to person, place, and  time.  Skin: Skin is warm and dry. No rash noted.  Psychiatric: Affect normal.    Recent Results (from the past 2160 hour(s))  CBC WITH DIFFERENTIAL     Status: Abnormal   Collection Time    08/12/13  2:28 PM      Result Value Ref Range   WBC 7.1  4.0 - 10.5 K/uL   RBC 4.01  3.87 - 5.11 MIL/uL   Hemoglobin 11.9 (*) 12.0 - 15.0 g/dL   HCT 34.8 (*) 36.0 - 46.0 %   MCV 86.8  78.0 - 100.0 fL   MCH 29.7  26.0 - 34.0 pg   MCHC 34.2  30.0 - 36.0 g/dL   RDW 13.5  11.5 - 15.5 %   Platelets 468 (*) 150 - 400 K/uL   Neutrophils Relative % 56  43 - 77 %   Neutro Abs 3.9  1.7 - 7.7 K/uL   Lymphocytes Relative 33  12 - 46 %   Lymphs Abs 2.3  0.7 - 4.0 K/uL   Monocytes Relative 8  3 - 12 %   Monocytes Absolute 0.6  0.1 - 1.0 K/uL   Eosinophils Relative 3  0 - 5 %   Eosinophils Absolute 0.2  0.0 - 0.7 K/uL   Basophils Relative 0  0 - 1 %   Basophils Absolute 0.0  0.0 - 0.1 K/uL   Smear Review Criteria for review not met    COMPREHENSIVE METABOLIC PANEL     Status: None   Collection Time    08/12/13  2:28 PM      Result Value Ref Range   Sodium 136  135 - 145 mEq/L   Potassium 4.0  3.5 - 5.3 mEq/L   Chloride 101  96 - 112 mEq/L   CO2 29  19 - 32 mEq/L   Glucose, Bld 89  70 - 99 mg/dL   BUN 7  6 - 23 mg/dL   Creat 0.63  0.50 - 1.10 mg/dL   Total Bilirubin 0.2  0.2 - 1.2 mg/dL   Comment: ** Please note change in reference range(s). **   Alkaline Phosphatase 76  39 - 117 U/L   AST 17  0 - 37 U/L   ALT 9  0 - 35 U/L   Total Protein 6.8  6.0 - 8.3 g/dL   Albumin 4.0  3.5 - 5.2 g/dL   Calcium 8.9  8.4 - 10.5 mg/dL  LIPASE     Status: None   Collection Time    08/12/13  2:28 PM      Result Value Ref Range   Lipase 45  0 - 75 U/L  H. PYLORI ANTIBODY, IGG     Status:  None   Collection Time    08/12/13  2:28 PM      Result Value Ref Range   H Pylori IgG 0.86     Comment: No significant level of IgG antibody to H. pylori detected.              ISR = Immune Status Ratio                   <0.90         ISR       Negative                  0.90 - 1.09   ISR       Equivocal                  >=1.10        ISR       Positive           The above results were obtained with the Immulite 2000 H. pylori IgG     EIA.  Results obtained from other manufacturer's assay methods may not     be used interchangeably.          Assessment/Plan: GERD (gastroesophageal reflux disease) Uncontrolled at present.  Patient given samples for Dexilant and a discount card for [place].  Will send in Rx for generic dexilant.  Avoid late-night eating, alcohol consumption.  Elevate HOB. Two TUMS at bedtime. If symptoms persist despite restarting PPI therapy, will need to send to GI for scoping.  Epigastric abdominal tenderness Seems mostly consistent with uncontrolled GERD and mils esophageal spasm.  Rx Dexilant. Rx GI cocktail x 1.  Patient instructed on lifestyle changes to help with symptoms.  Patient educated on alarm signs/symtpoms and when, if necessary, to report to the ER.

## 2013-08-16 NOTE — Assessment & Plan Note (Signed)
Seems mostly consistent with uncontrolled GERD and mils esophageal spasm.  Rx Dexilant. Rx GI cocktail x 1.  Patient instructed on lifestyle changes to help with symptoms.  Patient educated on alarm signs/symtpoms and when, if necessary, to report to the ER.

## 2013-08-16 NOTE — Assessment & Plan Note (Signed)
Uncontrolled at present.  Patient given samples for Dexilant and a discount card for [place].  Will send in Rx for generic dexilant.  Avoid late-night eating, alcohol consumption.  Elevate HOB. Two TUMS at bedtime. If symptoms persist despite restarting PPI therapy, will need to send to GI for scoping.

## 2013-08-20 ENCOUNTER — Ambulatory Visit: Payer: 59 | Admitting: Nurse Practitioner

## 2013-08-27 ENCOUNTER — Ambulatory Visit: Payer: 59 | Admitting: Family

## 2013-09-04 ENCOUNTER — Ambulatory Visit (INDEPENDENT_AMBULATORY_CARE_PROVIDER_SITE_OTHER): Payer: 59 | Admitting: Family

## 2013-09-04 ENCOUNTER — Encounter: Payer: Self-pay | Admitting: Family

## 2013-09-04 VITALS — BP 126/78 | HR 89 | Temp 98.7°F | Resp 20 | Ht 64.0 in | Wt 211.1 lb

## 2013-09-04 DIAGNOSIS — K219 Gastro-esophageal reflux disease without esophagitis: Secondary | ICD-10-CM

## 2013-09-04 DIAGNOSIS — G894 Chronic pain syndrome: Secondary | ICD-10-CM | POA: Insufficient documentation

## 2013-09-04 MED ORDER — PANTOPRAZOLE SODIUM 40 MG PO TBEC: 40.0000 mg | DELAYED_RELEASE_TABLET | Freq: Every day | ORAL | Status: DC

## 2013-09-04 MED ORDER — HYDROCODONE-ACETAMINOPHEN 10-325 MG PO TABS: 1.0000 | ORAL_TABLET | Freq: Three times a day (TID) | ORAL | Status: DC | PRN

## 2013-09-04 MED ORDER — SUCRALFATE 1 GM/10ML PO SUSP: 1.0000 g | Freq: Three times a day (TID) | ORAL | Status: DC

## 2013-09-04 NOTE — Assessment & Plan Note (Signed)
For cost purposes, d/c dexilant, instead start pantoprazole and carafate.

## 2013-09-04 NOTE — Patient Instructions (Signed)
Please complete lab work prior to leaving. Follow up in 3 months.  

## 2013-09-04 NOTE — Progress Notes (Signed)
Pre visit review using our clinic review tool, if applicable. No additional management support is needed unless otherwise documented below in the visit note. 

## 2013-09-04 NOTE — Progress Notes (Signed)
Subjective:    Patient ID: [name], female    DOB: 1966-04-29, 48 y.o.   MRN: [id number]  HPI  Ms. Lockamy is a 48 yr old female who presents today with chief complaint of back pain/neck pain.   Reports that she has been using hydrocodone twice daily.  She was seeing Dr. Jimmye Norman. She is getting pain management arranged through workman's comp.  She reports that her last dose of hydrocodone was last month.  She has followed with Dr. Marcial Pacas. He is retiring. It appears most recently that Dr. Jimmye Norman has been filling her hydrocodone.  She had MRI C spine 2012 which showed multilevel mild bulging discs. She has hx of L4-5 and L5-S1 fusion.  She reports constant pain on the back and legs, difficulty sleeping.   GERD- well controlled on dexilant but this is too expensive. Could not afford GI cocktail, but had good results in the past with carafate and requests refill.  Review of Systems See HPI  Past Medical History  Diagnosis Date  . Arthritis   . Overweight   . Anemia   . Personal history of colonic polyps 09/19/2011    tubular adenoma  . GERD (gastroesophageal reflux disease)   . Segmental colitis   . Irritable bowel syndrome     History   Social History  . Marital Status: Married    Spouse Name: N/A    Number of Children: 2  . Years of Education: N/A   Occupational History  . Disabled    Social History Main Topics  . Smoking status: Never Smoker   . Smokeless tobacco: Never Used  . Alcohol Use: No  . Drug Use: No  . Sexual Activity: Not on file   Other Topics Concern  . Not on file   Social History Narrative   Regular exercise:  No   Caffeine Use:  Occasional sweet tea   She is legally separated   2 children ages 71 year old son and 45 year old daughter.  Both children.   Previously worked in Scientist, research (medical), now disabled.   Completed 2 yrs of college.      Past Surgical History  Procedure Laterality Date  . Spine surgery  10/13/06    Posterior interbody fusion  (PLIF) L5-S1 (07/04/07)  . Knee surgery       Right knee arthroscopic partial lateral meniscectomy (01/22/07),arthroscopic partial lateral meniscectomy 5/09  . Fracture surgery  2011    Due to injury at work in 2008.  . Facet joint injection  03/28/11    injections in neck    Family History  Problem Relation Age of Onset  . Asthma Mother   . Cancer Mother     gastric tumor  . Glaucoma Mother   . Hyperlipidemia Mother   . Hypertension Maternal Aunt   . COPD Mother     No Known Allergies  Current Outpatient Prescriptions on File Prior to Visit  Medication Sig Dispense Refill  . Calcium Carbonate-Vitamin D (CALTRATE 600+D) 600-400 MG-UNIT per tablet Take 1 tablet by mouth 2 (two) times daily.        Marland Kitchen dexlansoprazole (DEXILANT) 60 MG capsule Take 1 capsule (60 mg total) by mouth daily.  30 capsule  2  . dicyclomine (BENTYL) 10 MG capsule TAKE ONE CAPSULE BY MOUTH THREE TIMES DAILY  90 capsule  0  . Hydrocodone-Acetaminophen (NORCO PO) Take by mouth as needed.       . methocarbamol (ROBAXIN) 500 MG tablet Take 500  mg by mouth 3 (three) times daily.      . Alum & Mag Hydroxide-Simeth (GI COCKTAIL) SUSP suspension Take 30 mLs by mouth once. Shake well.  30 mL  0  . clidinium-chlordiazePOXIDE (LIBRAX) 2.5-5 MG per capsule Take 1 capsule by mouth 3 (three) times daily as needed.  90 capsule  0  . [DISCONTINUED] omeprazole (PRILOSEC) 20 MG capsule Take 20 mg by mouth daily.       No current facility-administered medications on file prior to visit.    BP 126/78  Pulse 89  Temp(Src) 98.7 F (37.1 C) (Oral)  Resp 20  Ht 5\' 4"  (1.626 m)  Wt 211 lb 1.3 oz (95.745 kg)  BMI 36.21 kg/m2  SpO2 97%  LMP 02/25/2009       Objective:   Physical Exam  Constitutional: She is oriented to person, place, and time. She appears well-developed and well-nourished. No distress.  HENT:  Head: Normocephalic and atraumatic.  Eyes: Conjunctivae are normal. Pupils are equal, round, and reactive to  light.  Cardiovascular: Normal rate and regular rhythm.   No murmur heard. Pulmonary/Chest: Effort normal and breath sounds normal. No respiratory distress. She has no wheezes. She has no rales. She exhibits no tenderness.  Musculoskeletal: She exhibits no edema.  Lymphadenopathy:    She has no cervical adenopathy.  Neurological: She is alert and oriented to person, place, and time.  Psychiatric: She has a normal mood and affect. Her behavior is normal. Judgment and thought content normal.          Assessment & Plan:

## 2013-09-04 NOTE — Assessment & Plan Note (Addendum)
Advised pt that I would take over her pain meds temporarily until she can be seen by pain management. Obtain urine drug screen.  Controlled substance contract signed today. Rx provided to pt for hydrocodone. She tells me that she is waiting for her workman's comp to go through for payment of her pain management visits.

## 2013-09-05 LAB — DRUG SCREEN, URINE
AMPHETAMINE SCRN UR: NEGATIVE
BARBITURATE QUANT UR: NEGATIVE
Benzodiazepines.: NEGATIVE
COCAINE METABOLITES: NEGATIVE
Creatinine,U: 119.77 mg/dL
MARIJUANA METABOLITE: NEGATIVE
Methadone: NEGATIVE
OPIATES: POSITIVE — AB
Phencyclidine (PCP): NEGATIVE
Propoxyphene: NEGATIVE

## 2013-09-09 ENCOUNTER — Encounter: Payer: Self-pay | Admitting: Gastroenterology

## 2013-09-09 ENCOUNTER — Ambulatory Visit (INDEPENDENT_AMBULATORY_CARE_PROVIDER_SITE_OTHER): Payer: 59 | Admitting: Gastroenterology

## 2013-09-09 VITALS — BP 120/70 | HR 72 | Ht 63.0 in | Wt 212.0 lb

## 2013-09-09 DIAGNOSIS — K219 Gastro-esophageal reflux disease without esophagitis: Secondary | ICD-10-CM

## 2013-09-09 DIAGNOSIS — K589 Irritable bowel syndrome without diarrhea: Secondary | ICD-10-CM

## 2013-09-09 MED ORDER — DICYCLOMINE HCL 10 MG PO CAPS: 10.0000 mg | ORAL_CAPSULE | Freq: Three times a day (TID) | ORAL | Status: DC

## 2013-09-09 MED ORDER — PANTOPRAZOLE SODIUM 40 MG PO TBEC: 40.0000 mg | DELAYED_RELEASE_TABLET | Freq: Two times a day (BID) | ORAL | Status: DC

## 2013-09-09 NOTE — Patient Instructions (Addendum)
We have sent the following medications to your pharmacy for you to pick up at your convenience: Protonix twice daily and Bentyl. Call our office if this medication is too expensive.    Thank you for choosing me and [facility].  Pricilla Riffle. Dagoberto Ligas., MD., Marval Regal

## 2013-09-09 NOTE — Progress Notes (Signed)
    History of Present Illness: This is a 48 year old female with GERD and IBS. GERD symptoms are under excellent control on dexlansoprazole, which unfortunately she can't afford. IBS symptoms under excellent control on dicyclomine.  Current Medications, Allergies, Past Medical History, Past Surgical History, Family History and Social History were reviewed in [employer] record.  Physical Exam: General: Well developed , well nourished, no acute distress Head: Normocephalic and atraumatic Eyes:  sclerae anicteric, EOMI Ears: Normal auditory acuity Mouth: No deformity or lesions Lungs: Clear throughout to auscultation Heart: Regular rate and rhythm; no murmurs, rubs or bruits Abdomen: Soft, non tender and non distended. No masses, hepatosplenomegaly or hernias noted. Normal Bowel sounds Musculoskeletal: Symmetrical with no gross deformities  Pulses:  Normal pulses noted Extremities: No clubbing, cyanosis, edema or deformities noted Neurological: Alert oriented x 4, grossly nonfocal Psychological:  Alert and cooperative. Normal mood and affect  Assessment and Recommendations:  1. GERD. Trial of pantoprazole 40 mg twice daily along with standard antireflux measures. Return office visit in 1 year.  2. IBS. Continue dicyclomine 10 mg 3 times a day as needed. Return office in 1 year.

## 2013-10-08 DIAGNOSIS — R209 Unspecified disturbances of skin sensation: Secondary | ICD-10-CM | POA: Diagnosis not present

## 2013-10-08 DIAGNOSIS — M7710 Lateral epicondylitis, unspecified elbow: Secondary | ICD-10-CM | POA: Diagnosis not present

## 2013-10-14 ENCOUNTER — Telehealth: Payer: Self-pay | Admitting: *Deleted

## 2013-10-14 MED ORDER — HYDROCODONE-ACETAMINOPHEN 10-325 MG PO TABS: 1.0000 | ORAL_TABLET | Freq: Three times a day (TID) | ORAL | Status: DC | PRN

## 2013-10-14 NOTE — Telephone Encounter (Signed)
Left message on pt's voicemail at 12pm that rx had been completed and sent to front desk for pick up.

## 2013-10-14 NOTE — Telephone Encounter (Signed)
rx signed

## 2013-10-14 NOTE — Telephone Encounter (Signed)
Received message from pt requesting refill of Norco.  Last rx provided 09/04/13, #90 x no refills. Rx printed and forwarded to Provider for signature.

## 2013-11-11 ENCOUNTER — Telehealth: Payer: Self-pay | Admitting: *Deleted

## 2013-11-11 MED ORDER — HYDROCODONE-ACETAMINOPHEN 10-325 MG PO TABS: 1.0000 | ORAL_TABLET | Freq: Three times a day (TID) | ORAL | Status: DC | PRN

## 2013-11-11 NOTE — Telephone Encounter (Signed)
See rx. 

## 2013-11-11 NOTE — Telephone Encounter (Signed)
Received message from pt requesting refill of Norco.  Last rx provided 10/14/13, #90.  Pt has f/u in June.  Please advise.

## 2013-11-11 NOTE — Telephone Encounter (Signed)
Rx sent to front desk for pick up and pt notified.

## 2013-11-25 ENCOUNTER — Encounter: Payer: Self-pay | Admitting: Family

## 2013-11-25 ENCOUNTER — Ambulatory Visit: Payer: 59 | Admitting: Family

## 2013-11-25 ENCOUNTER — Ambulatory Visit (INDEPENDENT_AMBULATORY_CARE_PROVIDER_SITE_OTHER): Payer: 59 | Admitting: Family

## 2013-11-25 VITALS — BP 130/88 | HR 89 | Temp 98.9°F | Resp 16 | Ht 64.0 in | Wt 205.1 lb

## 2013-11-25 DIAGNOSIS — G894 Chronic pain syndrome: Secondary | ICD-10-CM

## 2013-11-25 DIAGNOSIS — K589 Irritable bowel syndrome without diarrhea: Secondary | ICD-10-CM

## 2013-11-25 DIAGNOSIS — K219 Gastro-esophageal reflux disease without esophagitis: Secondary | ICD-10-CM

## 2013-11-25 NOTE — Progress Notes (Signed)
Subjective:    Patient ID: [name], female    DOB: [birth year], 48 y.o.   MRN: [id number]  HPI  Ms. [name] is a 48 yr old female who presents today for follow up.   Has mediation 7/29 for her Workman's comp.  She is seeing Dr. Melton Alar neuro every 6 months. She is hoping that she will get coverage for her visits through workmans comp at which time she plans to establish with pain management. Continues to have back, right knee and neck pain.  She is using hydrocodone 3 times daily.    IBS- reports that this is stable.  Reports that IBS has stable recently.    GERD- dexilant worked better.  Now using pantoprazole due to cost.    Review of Systems See HPI  Past Medical History  Diagnosis Date  . Arthritis   . Overweight   . Anemia   . Personal history of colonic polyps 09/19/2011    tubular adenoma  . GERD (gastroesophageal reflux disease)   . Segmental colitis   . Irritable bowel syndrome     History   Social History  . Marital Status: Married    Spouse Name: N/A    Number of Children: 2  . Years of Education: N/A   Occupational History  . Disabled    Social History Main Topics  . Smoking status: Never Smoker   . Smokeless tobacco: Never Used  . Alcohol Use: No  . Drug Use: No  . Sexual Activity: Not on file   Other Topics Concern  . Not on file   Social History Narrative   Regular exercise:  No   Caffeine Use:  Occasional sweet tea   She is legally separated   2 children ages 15 year old son and 57 year old daughter.  Both children.   Previously worked in Scientist, research (medical), now disabled.   Completed 2 yrs of college.      Past Surgical History  Procedure Laterality Date  . Spine surgery  10/13/06    Posterior interbody fusion (PLIF) L5-S1 (07/04/07)  . Knee surgery       Right knee arthroscopic partial lateral meniscectomy (01/22/07),arthroscopic partial lateral meniscectomy 5/09  . Fracture surgery  2011    Due to injury at work in 2008.  . Facet joint  injection  03/28/11    injections in neck    Family History  Problem Relation Age of Onset  . Asthma Mother   . Cancer Mother     gastric tumor  . Glaucoma Mother   . Hyperlipidemia Mother   . Hypertension Maternal Aunt   . COPD Mother     No Known Allergies  Current Outpatient Prescriptions on File Prior to Visit  Medication Sig Dispense Refill  . Calcium Carbonate-Vitamin D (CALTRATE 600+D) 600-400 MG-UNIT per tablet Take 1 tablet by mouth 2 (two) times daily.        Marland Kitchen dicyclomine (BENTYL) 10 MG capsule Take 1 capsule (10 mg total) by mouth 3 (three) times daily before meals.  90 capsule  11  . HYDROcodone-acetaminophen (NORCO) 10-325 MG per tablet Take 1 tablet by mouth every 8 (eight) hours as needed.  90 tablet  0  . methocarbamol (ROBAXIN) 500 MG tablet Take 500 mg by mouth 3 (three) times daily.      . pantoprazole (PROTONIX) 40 MG tablet Take 1 tablet (40 mg total) by mouth 2 (two) times daily.  60 tablet  11  . [DISCONTINUED] omeprazole (PRILOSEC)  20 MG capsule Take 20 mg by mouth daily.       No current facility-administered medications on file prior to visit.    BP 130/88  Pulse 89  Temp(Src) 98.9 F (37.2 C) (Oral)  Resp 16  Ht 5\' 4"  (1.626 m)  Wt 205 lb 1.9 oz (93.042 kg)  BMI 35.19 kg/m2  SpO2 99%  LMP 02/25/2009       Objective:   Physical Exam  Constitutional: She is oriented to person, place, and time. She appears well-developed and well-nourished. No distress.  Cardiovascular: Normal rate and regular rhythm.   No murmur heard. Pulmonary/Chest: Effort normal and breath sounds normal. No respiratory distress. She has no wheezes. She has no rales. She exhibits no tenderness.  Musculoskeletal:       Cervical back: She exhibits no tenderness.       Thoracic back: She exhibits no tenderness.       Lumbar back: She exhibits tenderness.  Neurological: She is alert and oriented to person, place, and time.  Psychiatric: She has a normal mood and affect.  Her behavior is normal. Judgment and thought content normal.          Assessment & Plan:

## 2013-11-25 NOTE — Assessment & Plan Note (Signed)
Fair control on protonix. Continue same.

## 2013-11-25 NOTE — Patient Instructions (Signed)
Please schedule a follow up appointment in 3 months.

## 2013-11-25 NOTE — Assessment & Plan Note (Signed)
Stable, using prn bentyl.

## 2013-11-25 NOTE — Assessment & Plan Note (Signed)
Continue hydrocodone for knee pain/back pain/neck pain.  UDS was appropriately positive for opiates last visit.

## 2013-11-26 DIAGNOSIS — M7710 Lateral epicondylitis, unspecified elbow: Secondary | ICD-10-CM | POA: Diagnosis not present

## 2013-12-09 ENCOUNTER — Telehealth: Payer: Self-pay | Admitting: Family

## 2013-12-09 DIAGNOSIS — G894 Chronic pain syndrome: Secondary | ICD-10-CM

## 2013-12-09 MED ORDER — HYDROCODONE-ACETAMINOPHEN 10-325 MG PO TABS: 1.0000 | ORAL_TABLET | Freq: Three times a day (TID) | ORAL | Status: DC | PRN

## 2013-12-09 NOTE — Telephone Encounter (Signed)
Last OV was 11/25/13. Last RF printed 11/11/13.  Please advise RF.

## 2013-12-09 NOTE — Telephone Encounter (Signed)
Patient is requesting a refill on Norco, call when ready to be picked up

## 2013-12-09 NOTE — Telephone Encounter (Signed)
Refill granted.  Cannot be filled until 12/11/13.  Ready for pickup at front desk.

## 2013-12-10 NOTE — Telephone Encounter (Signed)
Informed patient of this.  °

## 2014-01-07 DIAGNOSIS — M7710 Lateral epicondylitis, unspecified elbow: Secondary | ICD-10-CM | POA: Diagnosis not present

## 2014-01-10 ENCOUNTER — Telehealth: Payer: Self-pay | Admitting: Family

## 2014-01-10 DIAGNOSIS — G894 Chronic pain syndrome: Secondary | ICD-10-CM

## 2014-01-10 MED ORDER — HYDROCODONE-ACETAMINOPHEN 10-325 MG PO TABS: 1.0000 | ORAL_TABLET | Freq: Three times a day (TID) | ORAL | Status: DC | PRN

## 2014-01-10 NOTE — Telephone Encounter (Signed)
Patient left message requesting refill on hydrocodone

## 2014-01-10 NOTE — Telephone Encounter (Signed)
Pt notified Rx ready for pick up at the front desk

## 2014-01-10 NOTE — Telephone Encounter (Signed)
Last Rx 12/11/13. Rx printed and forwarded to PRovider for signature.

## 2014-02-10 ENCOUNTER — Telehealth: Payer: Self-pay

## 2014-02-10 ENCOUNTER — Other Ambulatory Visit: Payer: Self-pay

## 2014-02-10 DIAGNOSIS — G894 Chronic pain syndrome: Secondary | ICD-10-CM

## 2014-02-10 MED ORDER — HYDROCODONE-ACETAMINOPHEN 10-325 MG PO TABS: 1.0000 | ORAL_TABLET | Freq: Three times a day (TID) | ORAL | Status: DC | PRN

## 2014-02-10 NOTE — Telephone Encounter (Signed)
Pt stated that "she needed a refill on Hydrocodone."LDM

## 2014-02-10 NOTE — Telephone Encounter (Signed)
Rx printed waiting signature from physician. LDM

## 2014-02-10 NOTE — Telephone Encounter (Signed)
The oNotify normal. No need for further imaging unless she does not improve with treatment rigional RX was shredded due to having Melissa's name on it.  I reprinted for md to sign with her name on the RX  Pt informed to come after 9:30 tomorrow am to get RX

## 2014-03-04 ENCOUNTER — Encounter: Payer: Self-pay | Admitting: Family

## 2014-03-04 ENCOUNTER — Ambulatory Visit (INDEPENDENT_AMBULATORY_CARE_PROVIDER_SITE_OTHER): Payer: 59 | Admitting: Family

## 2014-03-04 VITALS — BP 138/88 | HR 84 | Temp 98.4°F | Resp 16 | Ht 64.0 in | Wt 199.0 lb

## 2014-03-04 DIAGNOSIS — G894 Chronic pain syndrome: Secondary | ICD-10-CM

## 2014-03-04 DIAGNOSIS — Z23 Encounter for immunization: Secondary | ICD-10-CM

## 2014-03-04 DIAGNOSIS — K589 Irritable bowel syndrome without diarrhea: Secondary | ICD-10-CM

## 2014-03-04 DIAGNOSIS — K219 Gastro-esophageal reflux disease without esophagitis: Secondary | ICD-10-CM

## 2014-03-04 NOTE — Assessment & Plan Note (Signed)
Stable on protonix, continue same- follows with GI.

## 2014-03-04 NOTE — Progress Notes (Signed)
Pre visit review using our clinic review tool, if applicable. No additional management support is needed unless otherwise documented below in the visit note. 

## 2014-03-04 NOTE — Patient Instructions (Signed)
Schedule a fasting physical at the front desk this fall.

## 2014-03-04 NOTE — Assessment & Plan Note (Signed)
Continue hydrocodone as needed.

## 2014-03-04 NOTE — Assessment & Plan Note (Signed)
Stable on dicyclomine.

## 2014-03-04 NOTE — Progress Notes (Signed)
Subjective:    Patient ID: [name], female    DOB: 09-23-1965, 48 y.o.   MRN: [id number]  HPI [name] is a 48 year old female who presents today for follow up.   1. Chronic Pain -She has a pending worker's comp claim for an injury she received in 2008.  She fell off a ladder onto a concrete floor and since this injury has suffered neck pain, back pain and right knee pain.  She is hoping by the end of the month the claim will be settled.  Due to this injury she has had 4 knee surgeries and one back surgery.  She reports that she has been told, "I'm disabled" and can no longer work. Compeleted water therapy for back pain and physical therapy for her knee.  Currently does heat and cold therapy at home with relief.  Uses hydocodone/APAP and reports that this takes her pain from a 6/10 down to 2/10.  Decsribes the uses about 2 tabs a day. Pain in her neck and back as stabbing and gets worse with cold and wet weather.  She describes the knee pain as stabbing and it makes it hard for her to climb stairs.   2. Irritable Bowl syndrome - followed by Dr. Sharlett Iles (GI).  Takes dicyclomine with releif of symptoms and reports that she can eat a normal diet while taking this mediciation.   3. GERD - Also followed by Dr. Sharlett Iles.  Takes Protonix with relief of symptoms.       Review of Systems  Constitutional: Negative for chills, appetite change, fatigue and unexpected weight change.  HENT: Negative.   Eyes: Negative.   Respiratory: Negative.   Cardiovascular: Negative for chest pain, palpitations and leg swelling.  Gastrointestinal: Negative.   Musculoskeletal: Positive for back pain and neck pain. Negative for gait problem, joint swelling and neck stiffness.  Skin: Negative for color change, pallor, rash and wound.   Past Medical History  Diagnosis Date  . Arthritis   . Overweight(278.02)   . Anemia   . Personal history of colonic polyps 09/19/2011    tubular adenoma  . GERD  (gastroesophageal reflux disease)   . Segmental colitis   . Irritable bowel syndrome     History   Social History  . Marital Status: Married    Spouse Name: N/A    Number of Children: 2  . Years of Education: N/A   Occupational History  . Disabled    Social History Main Topics  . Smoking status: Never Smoker   . Smokeless tobacco: Never Used  . Alcohol Use: No  . Drug Use: No  . Sexual Activity: Not on file   Other Topics Concern  . Not on file   Social History Narrative   Regular exercise:  No   Caffeine Use:  Occasional sweet tea   She is legally separated   2 children ages 19 year old son and 50 year old daughter.  Both children.   Previously worked in [employer] (medical), now disabled.   Completed 2 yrs of college.      Past Surgical History  Procedure Laterality Date  . Spine surgery  10/13/06    Posterior interbody fusion (PLIF) L5-S1 (07/04/07)  . Knee surgery       Right knee arthroscopic partial lateral meniscectomy (01/22/07),arthroscopic partial lateral meniscectomy 5/09  . Fracture surgery  2011    Due to injury at work in 2008.  . Facet joint injection  03/28/11  injections in neck    Family History  Problem Relation Age of Onset  . Asthma Mother   . Cancer Mother     gastric tumor  . Glaucoma Mother   . Hyperlipidemia Mother   . Hypertension Maternal Aunt   . COPD Mother     No Known Allergies  Current Outpatient Prescriptions on File Prior to Visit  Medication Sig Dispense Refill  . Calcium Carbonate-Vitamin D (CALTRATE 600+D) 600-400 MG-UNIT per tablet Take 1 tablet by mouth 2 (two) times daily.        Marland Kitchen dicyclomine (BENTYL) 10 MG capsule Take 1 capsule (10 mg total) by mouth 3 (three) times daily before meals.  90 capsule  11  . HYDROcodone-acetaminophen (NORCO) 10-325 MG per tablet Take 1 tablet by mouth every 8 (eight) hours as needed.  90 tablet  0  . methocarbamol (ROBAXIN) 500 MG tablet Take 500 mg by mouth 3 (three) times daily.      .  pantoprazole (PROTONIX) 40 MG tablet Take 1 tablet (40 mg total) by mouth 2 (two) times daily.  60 tablet  11  . [DISCONTINUED] omeprazole (PRILOSEC) 20 MG capsule Take 20 mg by mouth daily.       No current facility-administered medications on file prior to visit.    BP 138/88  Pulse 84  Temp(Src) 98.4 F (36.9 C) (Oral)  Resp 16  Ht 5\' 4"  (1.626 m)  Wt 199 lb (90.266 kg)  BMI 34.14 kg/m2  SpO2 99%  LMP 02/25/2009        Objective:   Physical Exam  Constitutional: She is oriented to person, place, and time. She appears well-developed and well-nourished.  HENT:  Head: Normocephalic and atraumatic.  Right Ear: External ear normal.  Left Ear: External ear normal.  Mouth/Throat: No oropharyngeal exudate.  Eyes: Pupils are equal, round, and reactive to light.  Neck: Normal range of motion.  Cardiovascular: Normal rate, regular rhythm, normal heart sounds and intact distal pulses.   No murmur heard. Pulmonary/Chest: Effort normal and breath sounds normal.  Abdominal: Soft. Bowel sounds are normal. She exhibits no distension. There is no tenderness.  Musculoskeletal: She exhibits no edema and no tenderness.  Neurological: She is alert and oriented to person, place, and time.  Skin: Skin is warm and dry.          Assessment & Plan:  I have personally seen and examined patient and agree with Jettie Booze NP student's assessment and plan.

## 2014-03-04 NOTE — Progress Notes (Deleted)
Wt Readings from Last 3 Encounters:  03/04/14 199 lb (90.266 kg)  11/25/13 205 lb 1.9 oz (93.042 kg)  09/09/13 212 lb (96.163 kg)

## 2014-03-13 ENCOUNTER — Telehealth: Payer: Self-pay | Admitting: Family

## 2014-03-13 DIAGNOSIS — G894 Chronic pain syndrome: Secondary | ICD-10-CM

## 2014-03-13 NOTE — Telephone Encounter (Signed)
Caller name: Leeta  Relation to pt: self  Call back number: [phone]  Reason for call: pt is requesting a refill of HYDROcodone-acetaminophen. Please advise when ready to pick up

## 2014-03-14 MED ORDER — HYDROCODONE-ACETAMINOPHEN 10-325 MG PO TABS: 1.0000 | ORAL_TABLET | Freq: Three times a day (TID) | ORAL | Status: DC | PRN

## 2014-03-14 NOTE — Telephone Encounter (Signed)
Pt would like to pick up RX informed pt when ready she will be notified HYDROcodone-acetaminophen (NORCO) 10-325 MG per tablet

## 2014-03-14 NOTE — Telephone Encounter (Signed)
Notified pt that Rx is signed and placed at front desk for pick up. Advised pt that she will need to provide urine for drug screen when she picks up Rx and she voices understanding.

## 2014-03-14 NOTE — Telephone Encounter (Signed)
Last Rx printed 02/10/14, #90.  Rx printed and forwarded to Provider for signature.

## 2014-03-24 ENCOUNTER — Telehealth: Payer: Self-pay | Admitting: Family

## 2014-03-24 NOTE — Telephone Encounter (Signed)
Attempted to reach pt, mailbox full & unable to leave message.

## 2014-03-24 NOTE — Telephone Encounter (Signed)
Please contact pt and let her know that I reviewed her urine toxicology.  It is + for tramadol and oxycodone which are not being prescribed by our office.  Urine is negative for hydrocodone.  Because these findings are in violation of the controlled substance contract that she signed with [place], we will no longer be able to provide her with any additional controlled medications.

## 2014-03-25 ENCOUNTER — Other Ambulatory Visit: Payer: Self-pay | Admitting: Family

## 2014-03-25 NOTE — Telephone Encounter (Signed)
Notified pt. 

## 2014-05-07 ENCOUNTER — Encounter: Payer: 59 | Admitting: Family

## 2014-05-08 ENCOUNTER — Encounter: Payer: 59 | Admitting: Family

## 2014-05-08 ENCOUNTER — Telehealth: Payer: Self-pay | Admitting: *Deleted

## 2014-05-08 NOTE — Telephone Encounter (Signed)
Please send no show letter

## 2014-05-08 NOTE — Telephone Encounter (Signed)
Pt did not show for appointment 05/08/2014 at [facility] for CPE

## 2014-05-09 DIAGNOSIS — M7711 Lateral epicondylitis, right elbow: Secondary | ICD-10-CM | POA: Diagnosis not present

## 2014-05-12 ENCOUNTER — Encounter: Payer: Self-pay | Admitting: Family

## 2014-05-12 NOTE — Telephone Encounter (Signed)
Letter mailed 05/12/2014

## 2014-06-24 DIAGNOSIS — M7711 Lateral epicondylitis, right elbow: Secondary | ICD-10-CM | POA: Diagnosis not present

## 2014-06-24 DIAGNOSIS — R208 Other disturbances of skin sensation: Secondary | ICD-10-CM | POA: Diagnosis not present

## 2014-08-05 DIAGNOSIS — R208 Other disturbances of skin sensation: Secondary | ICD-10-CM | POA: Diagnosis not present

## 2014-08-05 DIAGNOSIS — M7711 Lateral epicondylitis, right elbow: Secondary | ICD-10-CM | POA: Diagnosis not present

## 2014-09-12 ENCOUNTER — Other Ambulatory Visit: Payer: Self-pay | Admitting: Gastroenterology

## 2014-09-15 NOTE — Telephone Encounter (Signed)
NEEDS OFFICE VISIT FOR ANY FURTHER REFILLS! 

## 2014-09-16 DIAGNOSIS — M7711 Lateral epicondylitis, right elbow: Secondary | ICD-10-CM | POA: Diagnosis not present

## 2014-09-25 ENCOUNTER — Other Ambulatory Visit: Payer: Self-pay | Admitting: Gastroenterology

## 2014-11-10 ENCOUNTER — Other Ambulatory Visit: Payer: Self-pay | Admitting: Gastroenterology

## 2014-11-14 ENCOUNTER — Ambulatory Visit (INDEPENDENT_AMBULATORY_CARE_PROVIDER_SITE_OTHER): Payer: 59 | Admitting: Gastroenterology

## 2014-11-14 ENCOUNTER — Encounter: Payer: Self-pay | Admitting: Gastroenterology

## 2014-11-14 VITALS — BP 122/70 | HR 88 | Ht 62.5 in | Wt 191.0 lb

## 2014-11-14 DIAGNOSIS — K219 Gastro-esophageal reflux disease without esophagitis: Secondary | ICD-10-CM

## 2014-11-14 DIAGNOSIS — K589 Irritable bowel syndrome without diarrhea: Secondary | ICD-10-CM | POA: Diagnosis not present

## 2014-11-14 DIAGNOSIS — Z86010 Personal history of colonic polyps: Secondary | ICD-10-CM

## 2014-11-14 MED ORDER — PANTOPRAZOLE SODIUM 40 MG PO TBEC: 40.0000 mg | DELAYED_RELEASE_TABLET | Freq: Two times a day (BID) | ORAL | Status: DC

## 2014-11-14 MED ORDER — DICYCLOMINE HCL 10 MG PO CAPS: ORAL_CAPSULE | ORAL | Status: DC

## 2014-11-14 NOTE — Progress Notes (Addendum)
    History of Present Illness: This is a 49 year old female who returns for follow-up of GERD and IBS. All her GI symptoms are under very good control on her current regimen however if she decreases pantoprazole to once daily or misses doses of dicyclomine she has symptoms. She is caring for her father who has metastatic cancer under hospice care.  Current Medications, Allergies, Past Medical History, Past Surgical History, Family History and Social History were reviewed in [employer] record.  Physical Exam: General: Well developed , well nourished, no acute distress Head: Normocephalic and atraumatic Eyes:  sclerae anicteric, EOMI Ears: Normal auditory acuity Mouth: No deformity or lesions Lungs: Clear throughout to auscultation Heart: Regular rate and rhythm; no murmurs, rubs or bruits Abdomen: Soft, non tender and non distended. No masses, hepatosplenomegaly or hernias noted. Normal Bowel sounds Musculoskeletal: Symmetrical with no gross deformities  Pulses:  Normal pulses noted Extremities: No clubbing, cyanosis, edema or deformities noted Neurological: Alert oriented x 4, grossly nonfocal Psychological:  Alert and cooperative. Normal mood and affect  Assessment and Recommendations:  15 minutes of face-to-face time was spent with the patient. Greater than 50% of the time was spent counseling and coordinating care.

## 2014-11-14 NOTE — Assessment & Plan Note (Signed)
Continue pantoprazole 40 mg bid and standard antireflux measures.

## 2014-11-14 NOTE — Assessment & Plan Note (Signed)
Stable. Continue Bentyl 10 mg tid ac.

## 2014-11-14 NOTE — Patient Instructions (Signed)
We have sent the following medications to your pharmacy for you to pick up at your convenience: dicyclomine and pantoprazole.   Thank you for choosing me and [facility].  Malcolm T. Stark, Jr., MD., FACG   

## 2014-11-14 NOTE — Assessment & Plan Note (Signed)
Five-year interval surveillance colonoscopy recommended March 2018.

## 2014-12-16 DIAGNOSIS — M7711 Lateral epicondylitis, right elbow: Secondary | ICD-10-CM | POA: Diagnosis not present

## 2014-12-16 DIAGNOSIS — R208 Other disturbances of skin sensation: Secondary | ICD-10-CM | POA: Diagnosis not present

## 2015-03-10 DIAGNOSIS — G44329 Chronic post-traumatic headache, not intractable: Secondary | ICD-10-CM | POA: Diagnosis not present

## 2015-03-10 DIAGNOSIS — G4489 Other headache syndrome: Secondary | ICD-10-CM | POA: Diagnosis not present

## 2015-03-10 DIAGNOSIS — M791 Myalgia: Secondary | ICD-10-CM | POA: Diagnosis not present

## 2015-03-10 DIAGNOSIS — M542 Cervicalgia: Secondary | ICD-10-CM | POA: Diagnosis not present

## 2015-03-10 DIAGNOSIS — G4700 Insomnia, unspecified: Secondary | ICD-10-CM | POA: Diagnosis not present

## 2015-05-18 DIAGNOSIS — M25572 Pain in left ankle and joints of left foot: Secondary | ICD-10-CM | POA: Diagnosis not present

## 2015-05-18 DIAGNOSIS — R208 Other disturbances of skin sensation: Secondary | ICD-10-CM | POA: Diagnosis not present

## 2015-05-18 DIAGNOSIS — M7711 Lateral epicondylitis, right elbow: Secondary | ICD-10-CM | POA: Diagnosis not present

## 2015-05-18 DIAGNOSIS — M7502 Adhesive capsulitis of left shoulder: Secondary | ICD-10-CM | POA: Diagnosis not present

## 2015-06-30 DIAGNOSIS — M7502 Adhesive capsulitis of left shoulder: Secondary | ICD-10-CM | POA: Diagnosis not present

## 2015-07-13 ENCOUNTER — Ambulatory Visit: Payer: 59 | Attending: Sports Medicine | Admitting: Physical Therapy

## 2015-07-13 ENCOUNTER — Encounter: Payer: Self-pay | Admitting: Physical Therapy

## 2015-07-13 ENCOUNTER — Other Ambulatory Visit: Payer: Self-pay | Admitting: Physical Medicine and Rehabilitation

## 2015-07-13 DIAGNOSIS — M25512 Pain in left shoulder: Secondary | ICD-10-CM | POA: Diagnosis not present

## 2015-07-13 DIAGNOSIS — M5416 Radiculopathy, lumbar region: Secondary | ICD-10-CM | POA: Diagnosis not present

## 2015-07-13 DIAGNOSIS — M545 Low back pain: Secondary | ICD-10-CM | POA: Diagnosis not present

## 2015-07-13 DIAGNOSIS — M961 Postlaminectomy syndrome, not elsewhere classified: Secondary | ICD-10-CM | POA: Diagnosis not present

## 2015-07-13 DIAGNOSIS — M25612 Stiffness of left shoulder, not elsewhere classified: Secondary | ICD-10-CM

## 2015-07-13 DIAGNOSIS — M7582 Other shoulder lesions, left shoulder: Secondary | ICD-10-CM | POA: Insufficient documentation

## 2015-07-13 DIAGNOSIS — M79604 Pain in right leg: Secondary | ICD-10-CM

## 2015-07-13 NOTE — Therapy (Signed)
[address] [place], [place], [address] Phone: [phone]   Fax:  [phone]  Physical Therapy Evaluation  Patient Details  Name: [name] MRN: [id number] Date of Birth: 1965/10/23 No Data Recorded  Encounter Date: 07/13/2015      PT End of Session - 07/13/15 0937    Visit Number 1   PT Start Time 0850   PT Stop Time 0949   PT Time Calculation (min) 59 min   Activity Tolerance Patient limited by pain   Behavior During Therapy [facility] for tasks assessed/performed      Past Medical History  Diagnosis Date  . Arthritis   . Overweight(278.02)   . Anemia   . Personal history of colonic polyps 09/19/2011    tubular adenoma  . GERD (gastroesophageal reflux disease)   . Segmental colitis ([place])   . Irritable bowel syndrome     Past Surgical History  Procedure Laterality Date  . Spine surgery  10/13/06    Posterior interbody fusion (PLIF) L5-S1 (07/04/07)  . Knee surgery       Right knee arthroscopic partial lateral meniscectomy (01/22/07),arthroscopic partial lateral meniscectomy 5/09  . Fracture surgery  2011    Due to injury at work in 2008.  . Facet joint injection  03/28/11    injections in neck    There were no vitals filed for this visit.  Visit Diagnosis:  Pain in joint of left shoulder  Decreased shoulder mobility, left      Subjective Assessment - 07/13/15 0853    Subjective Pt states she has been having shoulder pain for "a while" and it started getting worse 3-4 months ago. Does not recall any mechanism of injury. States she has had one injection that helped with pain but then it came back. Pt uses cane on left side for balance when walking long distances and has been having trouble using cane since pain started.    Limitations House hold activities;Other (comment)  difficulty getting dressed, difficulty sleeping   Patient Stated Goals for pain to go away   Currently in Pain? Yes   Pain Score 7   with any motion    Pain Location Shoulder   Pain Orientation Left   Pain Descriptors / Indicators Tightness   Pain Type Chronic pain   Pain Onset More than a month ago   Pain Frequency Intermittent   Aggravating Factors  any shoulder movement   Pain Relieving Factors rest, no movement, pain medication   Effect of Pain on Daily Activities Pt reports difficulty dressing and sleeping.            [facility] PT Assessment - 07/13/15 0001    Assessment   Medical Diagnosis left shoulder pain   Hand Dominance Right   Prior Therapy no prior PT for this injury   Precautions   Precautions None   Balance Screen   Has the patient fallen in the past 6 months No   Has the patient had a decrease in activity level because of a fear of falling?  No   Is the patient reluctant to leave their home because of a fear of falling?  No   Home Environment   Living Environment Private residence   [place] Access Level entry   [place] Two level   Alternate Level Stairs-Rails Right   [place] bars - tub/shower   Prior Function   Level of Independence  Independent   Vocation On disability   ROM / Strength   AROM / PROM / Strength AROM;Strength   AROM   AROM Assessment Site Shoulder;Cervical   Right/Left Shoulder Left   Left Shoulder Flexion 62 Degrees   Left Shoulder ABduction 60 Degrees   Cervical Flexion 50% limited   Cervical Extension 50% limited   Cervical - Right Side Bend 50% limited   Cervical - Left Side Bend 50% limited   Cervical - Right Rotation 25% limited   Cervical - Left Rotation 25% limited   Strength   Strength Assessment Site Shoulder;Elbow   Right/Left Shoulder Left   Left Shoulder Flexion 3/5   Left Shoulder ABduction 4/5   Left Shoulder Internal Rotation 3/5   Left Shoulder External Rotation 4/5   Right/Left Elbow Left   Left Elbow Flexion 4/5   Left Elbow Extension 4/5   Palpation   Palpation comment Pt guarded  and hypersensitive to palpation all around left shoulder   Special Tests    Special Tests Rotator Cuff Impingement   Rotator Cuff Impingment tests Michel Bickers test;Neer impingement test;Painful Arc of Motion  All tests positive secondary to pain.                   [facility] Adult PT Treatment/Exercise - 07/13/15 0001    Exercises   Exercises Shoulder   Shoulder Exercises: Seated   Retraction AROM;10 reps   Other Seated Exercises over the door pulleys 30 seconds x 3   Shoulder Exercises: ROM/Strengthening   UBE (Upper Arm Bike) level 1 x 2 minutes                PT Education - 07/13/15 0936    Education provided Yes   Education Details HEP   Person(s) Educated Patient   Methods Explanation;Demonstration;Handout   Comprehension Verbalized understanding;Returned demonstration          PT Short Term Goals - 07/13/15 0942    PT SHORT TERM GOAL #1   Title Pt will be independent with HEP   Time 1   Period Weeks   Status New           PT Long Term Goals - 07/13/15 [id number]    PT LONG TERM GOAL #1   Title Pt will improve left shoulder strength to 5/5 in all planes.   Time 8   Period Weeks   Status New   PT LONG TERM GOAL #2   Title Pt will verbally report 0/10 pain with left shoulder movements.   Time 8   Period Weeks   Status New   PT LONG TERM GOAL #3   Title Pt will increase shoulder ROM by 50% in all planes.   Time 8   Period Weeks   Status New   PT LONG TERM GOAL #4   Title Pt will verbally report 0/10 pain with dressing.   Time 8   Period Weeks   Status New               Plan - 07/13/15 [id number]    Clinical Impression Statement Pt severely limited secondary to pain. Pt is guarded with all shoulder motions and has limited strength as well. Pt appears apprehensive to try different movements because of the pain.   Pt will benefit from skilled therapeutic intervention in order to improve on the following deficits Decreased activity  tolerance;Decreased range of motion;Decreased strength;Hypomobility;Increased muscle spasms;Impaired UE functional use;Pain   Rehab Potential Good  PT Frequency 2x / week   PT Duration 8 weeks   PT Treatment/Interventions ADLs/Self Care Home Management;Electrical Stimulation;Ultrasound;Moist Heat;Functional mobility training;Therapeutic activities;Therapeutic exercise;Patient/family education;Manual techniques;Energy conservation;Passive range of motion   PT Next Visit Plan STM, passive movements, increase UBE time, progress exercises as tolerated   PT Home Exercise Plan scapular retraction, shoulder/back stretches   Consulted and Agree with Plan of Care Patient         Problem List Patient Active Problem List   Diagnosis Date Noted  . Hx of adenomatous colonic polyps 11/14/2014  . Chronic pain syndrome 09/04/2013  . Epigastric abdominal tenderness 08/16/2013  . Elbow pain, right 06/30/2013  . Fibromyalgia 04/19/2012  . IBS (irritable bowel syndrome) 04/19/2012  . Joint pain 04/19/2012  . Benign neoplasm of colon 09/19/2011  . Colitis, nonspecific 09/19/2011  . Hematochezia 09/19/2011  . Abdominal pain, other specified site 09/08/2011  . GERD (gastroesophageal reflux disease) 09/08/2011  . General medical examination 04/13/2011  . Cervical disc disease 03/15/2011  . Multiple thyroid nodules 02/14/2011    Barbette Merino, SPT 07/13/2015, 10:01 AM  [address] [place], [place], [address] Phone: [phone]   Fax:  [phone]  Name: [name] MRN: [id number] Date of Birth: 13-Aug-1965

## 2015-07-14 DIAGNOSIS — M7502 Adhesive capsulitis of left shoulder: Secondary | ICD-10-CM | POA: Diagnosis not present

## 2015-07-15 DIAGNOSIS — R5383 Other fatigue: Secondary | ICD-10-CM | POA: Diagnosis not present

## 2015-07-17 ENCOUNTER — Ambulatory Visit: Payer: 59 | Admitting: Physical Therapy

## 2015-07-18 ENCOUNTER — Ambulatory Visit
Admission: RE | Admit: 2015-07-18 | Discharge: 2015-07-18 | Disposition: A | Discharge status: Home / Self Care | Payer: 59 | Source: Ambulatory Visit | Attending: Physical Medicine and Rehabilitation | Admitting: Physical Medicine and Rehabilitation

## 2015-07-18 DIAGNOSIS — M545 Low back pain: Secondary | ICD-10-CM

## 2015-07-18 DIAGNOSIS — M79604 Pain in right leg: Secondary | ICD-10-CM

## 2015-07-18 DIAGNOSIS — M5126 Other intervertebral disc displacement, lumbar region: Secondary | ICD-10-CM | POA: Diagnosis not present

## 2015-07-20 ENCOUNTER — Ambulatory Visit: Payer: 59 | Admitting: Physical Therapy

## 2015-07-20 DIAGNOSIS — M542 Cervicalgia: Secondary | ICD-10-CM | POA: Diagnosis not present

## 2015-07-20 DIAGNOSIS — M961 Postlaminectomy syndrome, not elsewhere classified: Secondary | ICD-10-CM | POA: Diagnosis not present

## 2015-07-20 DIAGNOSIS — M5416 Radiculopathy, lumbar region: Secondary | ICD-10-CM | POA: Diagnosis not present

## 2015-07-20 DIAGNOSIS — M545 Low back pain: Secondary | ICD-10-CM | POA: Diagnosis not present

## 2015-07-20 DIAGNOSIS — M5032 Other cervical disc degeneration, mid-cervical region, unspecified level: Secondary | ICD-10-CM | POA: Diagnosis not present

## 2015-07-21 ENCOUNTER — Ambulatory Visit: Payer: 59 | Admitting: Physical Therapy

## 2015-07-21 ENCOUNTER — Encounter: Payer: Self-pay | Admitting: Physical Therapy

## 2015-07-21 DIAGNOSIS — M25612 Stiffness of left shoulder, not elsewhere classified: Secondary | ICD-10-CM

## 2015-07-21 DIAGNOSIS — M25512 Pain in left shoulder: Secondary | ICD-10-CM | POA: Diagnosis not present

## 2015-07-21 DIAGNOSIS — M7582 Other shoulder lesions, left shoulder: Secondary | ICD-10-CM | POA: Diagnosis not present

## 2015-07-21 NOTE — Therapy (Signed)
[place] [address] [place], [place], [address] Phone: [phone]   Fax:  [phone]  Physical Therapy Treatment  Patient Details  Name: [name] MRN: [id number] Date of Birth: 09/20/1965 No Data Recorded  Encounter Date: 07/21/2015      PT End of Session - 07/21/15 1018    Visit Number 2   PT Start Time 0935   PT Stop Time [id number]   PT Time Calculation (min) 57 min   Activity Tolerance Patient limited by pain   Behavior During Therapy [facility] for tasks assessed/performed      Past Medical History  Diagnosis Date  . Arthritis   . Overweight(278.02)   . Anemia   . Personal history of colonic polyps 09/19/2011    tubular adenoma  . GERD (gastroesophageal reflux disease)   . Segmental colitis ([place])   . Irritable bowel syndrome     Past Surgical History  Procedure Laterality Date  . Spine surgery  10/13/06    Posterior interbody fusion (PLIF) L5-S1 (07/04/07)  . Knee surgery       Right knee arthroscopic partial lateral meniscectomy (01/22/07),arthroscopic partial lateral meniscectomy 5/09  . Fracture surgery  2011    Due to injury at work in 2008.  . Facet joint injection  03/28/11    injections in neck    There were no vitals filed for this visit.  Visit Diagnosis:  Decreased shoulder mobility, left  Pain in joint of left shoulder      Subjective Assessment - 07/21/15 0938    Subjective Pt stated that she had an injection in her L shoulder Tuesday, yesterday went back to [facility] to get a check up and was told to continue PT.    Currently in Pain? Yes   Pain Score 3    Pain Location Shoulder                         [facility] Adult PT Treatment/Exercise - 07/21/15 0001    Shoulder Exercises: Seated   Row 10 reps;Weights  2 sets    Row Weight (lbs) 10   Shoulder Exercises: Standing   External Rotation 10 reps;Theraband  2 sets    Theraband Level (Shoulder External Rotation) Level 1  (Yellow)   Internal Rotation 10 reps;Theraband   Theraband Level (Shoulder Internal Rotation) Level 1 (Yellow)   Flexion 10 reps;Weights  2 set   Shoulder Flexion Weight (lbs) 1   Extension 10 reps;Theraband  2 sets    Theraband Level (Shoulder Extension) Level 1 (Yellow)   Shoulder Exercises: ROM/Strengthening   UBE (Upper Arm Bike) level 1 46frd/3rev   Modalities   Modalities Electrical Stimulation;Moist Heat   Moist Heat Therapy   Number Minutes Moist Heat 15 Minutes   Moist Heat Location Shoulder   Electrical Stimulation   Electrical Stimulation Location L shoulder    Electrical Stimulation Action IFC   Electrical Stimulation Parameters tolerance    Electrical Stimulation Goals Pain   Manual Therapy   Manual Therapy Joint mobilization;Passive ROM   Manual therapy comments PROM taken to end range and held   Joint Mobilization L shoulder grades 2-3 all directions   Passive ROM L shoulder all directions                   PT Short Term Goals - 07/13/15 [id number]    PT SHORT TERM GOAL #1   Title Pt will be  independent with HEP   Time 1   Period Weeks   Status New           PT Long Term Goals - 07/13/15 [id number]    PT LONG TERM GOAL #1   Title Pt will improve left shoulder strength to 5/5 in all planes.   Time 8   Period Weeks   Status New   PT LONG TERM GOAL #2   Title Pt will verbally report 0/10 pain with left shoulder movements.   Time 8   Period Weeks   Status New   PT LONG TERM GOAL #3   Title Pt will increase shoulder ROM by 50% in all planes.   Time 8   Period Weeks   Status New   PT LONG TERM GOAL #4   Title Pt will verbally report 0/10 pain with dressing.   Time 8   Period Weeks   Status New               Plan - 07/21/15 1020    Clinical Impression Statement Pt very guarded throughout treatment, slow movements with all interventions requiring increased time. Pt does relax more with standing Tband interventions. Tolerated MT well does  have pain at end ranges and becomes guarded.   Pt will benefit from skilled therapeutic intervention in order to improve on the following deficits Decreased activity tolerance;Decreased range of motion;Decreased strength;Hypomobility;Increased muscle spasms;Impaired UE functional use;Pain   Rehab Potential Good   PT Frequency 2x / week   PT Duration 8 weeks   PT Treatment/Interventions ADLs/Self Care Home Management;Electrical Stimulation;Ultrasound;Moist Heat;Functional mobility training;Therapeutic activities;Therapeutic exercise;Patient/family education;Manual techniques;Energy conservation;Passive range of motion   PT Next Visit Plan STM, passive movements, progress exercises as tolerated        Problem List Patient Active Problem List   Diagnosis Date Noted  . Hx of adenomatous colonic polyps 11/14/2014  . Chronic pain syndrome 09/04/2013  . Epigastric abdominal tenderness 08/16/2013  . Elbow pain, right 06/30/2013  . Fibromyalgia 04/19/2012  . IBS (irritable bowel syndrome) 04/19/2012  . Joint pain 04/19/2012  . Benign neoplasm of colon 09/19/2011  . Colitis, nonspecific 09/19/2011  . Hematochezia 09/19/2011  . Abdominal pain, other specified site 09/08/2011  . GERD (gastroesophageal reflux disease) 09/08/2011  . General medical examination 04/13/2011  . Cervical disc disease 03/15/2011  . Multiple thyroid nodules 02/14/2011    Scot Jun, PTA  07/21/2015, 10:23 AM  [address] [place], [place], [address] Phone: [phone]   Fax:  [phone]  Name: [name] MRN: [id number] Date of Birth: 05-07-1966

## 2015-07-23 ENCOUNTER — Encounter: Payer: Self-pay | Admitting: Physical Therapy

## 2015-07-23 ENCOUNTER — Telehealth: Payer: Self-pay | Admitting: Gastroenterology

## 2015-07-23 ENCOUNTER — Ambulatory Visit: Payer: 59 | Admitting: Physical Therapy

## 2015-07-23 DIAGNOSIS — M25512 Pain in left shoulder: Secondary | ICD-10-CM | POA: Diagnosis not present

## 2015-07-23 DIAGNOSIS — M7582 Other shoulder lesions, left shoulder: Secondary | ICD-10-CM | POA: Diagnosis not present

## 2015-07-23 DIAGNOSIS — M25612 Stiffness of left shoulder, not elsewhere classified: Secondary | ICD-10-CM

## 2015-07-23 NOTE — Telephone Encounter (Signed)
Left message for patient to call back  

## 2015-07-23 NOTE — Therapy (Signed)
[place], [place], [address] Phone: [phone]   Fax:  [phone]  Physical Therapy Treatment  Patient Details  Name: [name] MRN: [id number] Date of Birth: 1965/08/25 No Data Recorded  Encounter Date: 07/23/2015      PT End of Session - 07/23/15 1059    Visit Number 3   PT Start Time [id number]   PT Stop Time 1114   PT Time Calculation (min) 59 min   Behavior During Therapy [id number] for tasks assessed/performed      Past Medical History  Diagnosis Date  . Arthritis   . Overweight(278.02)   . Anemia   . Personal history of colonic polyps 09/19/2011    tubular adenoma  . GERD (gastroesophageal reflux disease)   . 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 Irritable bowel syndrome     Past Surgical History  Procedure Laterality Date  . Spine surgery  10/13/06    Posterior interbody fusion (PLIF) L5-S1 (07/04/07)  . Knee surgery       Right knee arthroscopic partial lateral meniscectomy (01/22/07),arthroscopic partial lateral meniscectomy 5/09  . Fracture surgery  2011    Due to injury at work in 2008.  . Facet joint injection  03/28/11    injections in neck    There were no vitals filed for this visit.  Visit Diagnosis:  Pain in joint of left shoulder  Decreased shoulder mobility, left      Subjective Assessment - 07/23/15 1015    Subjective "My shoulder was sore after somebody worked it out the other day"   Currently in Pain? Yes   Pain Score 3    Pain Location Shoulder            [facility] PT Assessment - 07/23/15 0001    AROM   Left Shoulder Flexion 114 Degrees   Left Shoulder ABduction 70 Degrees   Cervical Flexion 25% limited   Cervical Extension 25% limited   Cervical - Right Side Bend 50% limited   Cervical - Left Side Bend 50% limited   Cervical - Right Rotation 25% limited   Cervical - Left Rotation 25% limited                     [facility] Adult PT  Treatment/Exercise - 07/23/15 0001    Shoulder Exercises: Seated   Row 10 reps;Weights  2 sets    Row Weight (lbs) 10   Other Seated Exercises Lat pull downs #15 2x10   Shoulder Exercises: Standing   External Rotation 10 reps;Theraband  2 sets    Theraband Level (Shoulder External Rotation) Level 1 (Yellow)   Internal Rotation 10 reps;Theraband  2 sets    Theraband Level (Shoulder Internal Rotation) Level 1 (Yellow);Level 2 (Red)   Flexion 10 reps;Weights  2 sets    Shoulder Flexion Weight (lbs) 2   ABduction 10 reps;Weights  2 sets    Shoulder ABduction Weight (lbs) 1   Extension 10 reps;Theraband  2 sets    Theraband Level (Shoulder Extension) Level 2 (Red)   Shoulder Exercises: ROM/Strengthening   UBE (Upper Arm Bike) level 1 56frd/3rev   Modalities   Modalities Electrical Stimulation;Moist Heat   Moist Heat Therapy   Number Minutes Moist Heat 15 Minutes   Moist Heat Location Shoulder   Electrical Stimulation   Electrical Stimulation Location L shoulder    Electrical Stimulation Action IFC   Electrical Stimulation Parameters tolerance  Electrical Stimulation Goals Pain   Manual Therapy   Manual Therapy Passive ROM   Manual therapy comments PROM taken to end range and held   Passive ROM L shoulder all directions                   PT Short Term Goals - 07/13/15 0942    PT SHORT TERM GOAL #1   Title Pt will be independent with HEP   Time 1   Period Weeks   Status New           PT Long Term Goals - 07/13/15 [id number]    PT LONG TERM GOAL #1   Title Pt will improve left shoulder strength to 5/5 in all planes.   Time 8   Period Weeks   Status New   PT LONG TERM GOAL #2   Title Pt will verbally report 0/10 pain with left shoulder movements.   Time 8   Period Weeks   Status New   PT LONG TERM GOAL #3   Title Pt will increase shoulder ROM by 50% in all planes.   Time 8   Period Weeks   Status New   PT LONG TERM GOAL #4   Title Pt will verbally  report 0/10 pain with dressing.   Time 8   Period Weeks   Status New               Plan - 07/23/15 1100    Clinical Impression Statement Very slow movement with exercises, Pt appears to be scared to cause pain. Small improvement with cervical and L shoulder AROM. Pt L shoulder PROM > AROM. Does tolerate stronger Tband with some interventions.   Pt will benefit from skilled therapeutic intervention in order to improve on the following deficits Decreased activity tolerance;Decreased range of motion;Decreased strength;Hypomobility;Increased muscle spasms;Impaired UE functional use;Pain   Rehab Potential Good   PT Frequency 2x / week   PT Treatment/Interventions ADLs/Self Care Home Management;Electrical Stimulation;Ultrasound;Moist Heat;Functional mobility training;Therapeutic activities;Therapeutic exercise;Patient/family education;Manual techniques;Energy conservation;Passive range of motion   PT Next Visit Plan STM, passive movements, progress exercises as tolerated        Problem List Patient Active Problem List   Diagnosis Date Noted  . Hx of adenomatous colonic polyps 11/14/2014  . Chronic pain syndrome 09/04/2013  . Epigastric abdominal tenderness 08/16/2013  . Elbow pain, right 06/30/2013  . Fibromyalgia 04/19/2012  . IBS (irritable bowel syndrome) 04/19/2012  . Joint pain 04/19/2012  . Benign neoplasm of colon 09/19/2011  . Colitis, nonspecific 09/19/2011  . Hematochezia 09/19/2011  . Abdominal pain, other specified site 09/08/2011  . GERD (gastroesophageal reflux disease) 09/08/2011  . General medical examination 04/13/2011  . Cervical disc disease 03/15/2011  . Multiple thyroid nodules 02/14/2011    Scot Jun, PTA  07/23/2015, 11:03 AM  [address] [place] [place], [place], [address] Phone: [phone]   Fax:  [phone]  Name: [name] MRN: [id number] Date of Birth:  25-Dec-1965

## 2015-07-23 NOTE — Telephone Encounter (Signed)
Patient with reflux and she is not able to get relief with dietary changes and BID pantoprazole.  Patient instructed to maintain an anti-reflux diet. Advised to avoid caffeine, mint, citrus foods/juices, tomatoes,  chocolate, NSAIDS/ASA products.  Instructed not to eat within 2 hours of exercise or bed, multiple small meals are better than 3 large meals.  Need to take PPI 30 minutes prior to 1st meal of the day and at bedtime.  She will come in and see Arta Bruce, PA on 07/28/15.  She is also advised to try OTC pepcid or Gaviscon.

## 2015-07-28 ENCOUNTER — Ambulatory Visit (INDEPENDENT_AMBULATORY_CARE_PROVIDER_SITE_OTHER): Payer: 59 | Admitting: Physician Assistant

## 2015-07-28 ENCOUNTER — Encounter: Payer: Self-pay | Admitting: Physician Assistant

## 2015-07-28 ENCOUNTER — Other Ambulatory Visit (INDEPENDENT_AMBULATORY_CARE_PROVIDER_SITE_OTHER): Payer: 59

## 2015-07-28 VITALS — BP 118/68 | HR 74 | Ht 62.0 in | Wt 179.0 lb

## 2015-07-28 DIAGNOSIS — K219 Gastro-esophageal reflux disease without esophagitis: Secondary | ICD-10-CM

## 2015-07-28 DIAGNOSIS — R1013 Epigastric pain: Secondary | ICD-10-CM | POA: Diagnosis not present

## 2015-07-28 DIAGNOSIS — K5900 Constipation, unspecified: Secondary | ICD-10-CM

## 2015-07-28 DIAGNOSIS — R6881 Early satiety: Secondary | ICD-10-CM

## 2015-07-28 LAB — CBC WITH DIFFERENTIAL/PLATELET
BASOS PCT: 0.3 % (ref 0.0–3.0)
Basophils Absolute: 0 10*3/uL (ref 0.0–0.1)
Eosinophils Absolute: 0.2 10*3/uL (ref 0.0–0.7)
Eosinophils Relative: 2.2 % (ref 0.0–5.0)
HCT: 35.4 % — ABNORMAL LOW (ref 36.0–46.0)
Hemoglobin: 11.7 g/dL — ABNORMAL LOW (ref 12.0–15.0)
Lymphocytes Relative: 20.5 % (ref 12.0–46.0)
Lymphs Abs: 2 10*3/uL (ref 0.7–4.0)
MCHC: 33.1 g/dL (ref 30.0–36.0)
MCV: 88.7 fl (ref 78.0–100.0)
MONO ABS: 1 10*3/uL (ref 0.1–1.0)
Monocytes Relative: 10.4 % (ref 3.0–12.0)
NEUTROS PCT: 66.6 % (ref 43.0–77.0)
Neutro Abs: 6.4 10*3/uL (ref 1.4–7.7)
Platelets: 507 10*3/uL — ABNORMAL HIGH (ref 150.0–400.0)
RBC: 3.99 Mil/uL (ref 3.87–5.11)
RDW: 13.4 % (ref 11.5–15.5)
WBC: 9.7 10*3/uL (ref 4.0–10.5)

## 2015-07-28 LAB — COMPREHENSIVE METABOLIC PANEL
ALBUMIN: 3.9 g/dL (ref 3.5–5.2)
ALT: 46 U/L — ABNORMAL HIGH (ref 0–35)
AST: 35 U/L (ref 0–37)
Alkaline Phosphatase: 95 U/L (ref 39–117)
BUN: 8 mg/dL (ref 6–23)
CO2: 34 mEq/L — ABNORMAL HIGH (ref 19–32)
Calcium: 9.7 mg/dL (ref 8.4–10.5)
Chloride: 99 mEq/L (ref 96–112)
Creatinine, Ser: 0.71 mg/dL (ref 0.40–1.20)
GFR: 113.08 mL/min (ref >60.00)
GLUCOSE: 98 mg/dL (ref 70–99)
POTASSIUM: 3.9 meq/L (ref 3.5–5.1)
Sodium: 139 mEq/L (ref 135–145)
TOTAL PROTEIN: 8.1 g/dL (ref 6.0–8.3)
Total Bilirubin: 0.3 mg/dL (ref 0.2–1.2)

## 2015-07-28 LAB — LIPASE: LIPASE: 44 U/L (ref 11.0–59.0)

## 2015-07-28 MED ORDER — METOCLOPRAMIDE HCL 5 MG/5ML PO SOLN: ORAL | Status: DC

## 2015-07-28 NOTE — Patient Instructions (Signed)
You have been scheduled for an abdominal [facility] on 07/[facility]/15 at 9 am. Please arrive 15 minutes prior to your appointment for registration. Make certain not to have anything to eat or drink 6 hours prior to your appointment. Should you need to reschedule your appointment, please contact radiology at [phone].This test typically takes about 30 minutes to perform.   Your physician has requested that you go to the basement for lab work before leaving today.  Continue your Pantoprazole 40 mg 1 every morning 30 mins prior to breakfast.   We have sent the following medications to your pharmacy for you to pick up at your convenience:  Reglan syrup 5 mg/5 mL take one tsp before meals and at bedtime for 4 days.   Please keep your follow up with Dr. Fuller Plan.

## 2015-07-28 NOTE — Progress Notes (Signed)
Patient ID: JANILAH CZAR, female   DOB: Nov 26, 1965, 50 y.o.   MRN: [id number]     History of Present Illness: KINYETTA BAUMGARNER  Is a pleasant 50 year old female known to Dr. Fuller Plan with a history of GERD and IBS. Her symptoms have been under good control until 2 half to 3 weeks ago when she went out to eat and ate a salad. She states the salad dressing tasted funny and later that night she developed severe stomach pain the following day she felt sick all over. Since then her appetite has been decreased. She feels full after 2 bites and feels as if she has eaten Thanksgiving dinner even after 2 bites. She had run out of her pantoprazole and started having epigastric pain. She restarted the pantoprazole and also started using Pepcid Complete. Her pain has decreased but she still feels very full. She reports that she gets occasional postprandial nausea and that she has been having right upper quadrant pain postprandially that radiates to the scapula once or twice a week. She has not experienced vomiting but has been nauseous. She also reports that her bowel movements have been hard and nugget-like. She has had no bright red blood per rectum or melena.   Past Medical History  Diagnosis Date  . Arthritis   . Overweight(278.02)   . Anemia   . Personal history of colonic polyps 09/19/2011    tubular adenoma  . GERD (gastroesophageal reflux disease)   . 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 Irritable bowel syndrome     Past Surgical History  Procedure Laterality Date  . Spine surgery  10/13/06    Posterior interbody fusion (PLIF) L5-S1 (07/04/07)  . Knee surgery       Right knee arthroscopic partial lateral meniscectomy (01/22/07),arthroscopic partial lateral meniscectomy 5/09  . Fracture surgery  2011    Due to injury at work in 2008.  . Facet joint injection  03/28/11    injections in neck   Family History  Problem Relation Age of Onset  . Asthma Mother   . Cancer Mother     gastric tumor  . Glaucoma  Mother   . Hyperlipidemia Mother   . Hypertension Maternal Aunt   . COPD Mother   . Esophageal cancer Father     mets   Social History  Substance Use Topics  . Smoking status: Never Smoker   . Smokeless tobacco: Never Used  . Alcohol Use: No   Current Outpatient Prescriptions  Medication Sig Dispense Refill  . Calcium Carbonate-Vitamin D (CALTRATE 600+D) 600-400 MG-UNIT per tablet Take 1 tablet by mouth 2 (two) times daily.      Marland Kitchen dicyclomine (BENTYL) 10 MG capsule TAKE ONE CAPSULE BY MOUTH THREE TIMES DAILY BEFORE MEAL(S) 90 capsule 11  . methocarbamol (ROBAXIN) 500 MG tablet Take 500 mg by mouth 3 (three) times daily.    . pantoprazole (PROTONIX) 40 MG tablet Take 1 tablet (40 mg total) by mouth 2 (two) times daily. 60 tablet 11  . traMADol (ULTRAM) 50 MG tablet 1-2 tablets by mouth every 6 hours as needed    . metoCLOPramide (REGLAN) 5 MG/5ML solution Take one tsp before meals and at bedtime x 4 days 80 mL 0  . [DISCONTINUED] omeprazole (PRILOSEC) 20 MG capsule Take 20 mg by mouth daily.     No current facility-administered medications for this visit.   No Known Allergies   Review of Systems:  per history of present illness otherwise negative.   Physical  Exam: BP 118/68 mmHg  Pulse 74  Ht 5\' 2"  (1.575 m)  Wt 179 lb (81.194 kg)  BMI 32.73 kg/m2  LMP 02/25/2009 General: Pleasant, well developed , female in no acute distress Head: Normocephalic and atraumatic Eyes:  sclerae anicteric, conjunctiva pink  Ears: Normal auditory acuity Lungs: Clear throughout to auscultation Heart: Regular rate and rhythm Abdomen: Soft, non distended,  Mild epigastric and right upper quadrant pain with no rebound or guarding, No masses, no hepatomegaly. Normal bowel sounds Musculoskeletal: Symmetrical with no gross deformities  Extremities: No edema  Neurological: Alert oriented x 4, grossly nonfocal Psychological:  Alert and cooperative. Normal mood and affect  Assessment and  Recommendations:  50 year old female with a history of GERD and IBS presenting with a  2-1/2 to three-week history of early satiety, postprandial nausea and intermittent right upper quadrant and epigastric pain. She may have  An element of gastroparesis  due to a viral gastroenteritis and will be given a trial of Reglan syrup 5 mg per 5 ML's, 5 mL's before meals and at bedtime for 4 days. She has been advised of the possible side effects of Reglan and was advised to discontinue it immediately if she has any problems and to contact us. She will continue pantoprazole twice daily. A CBC with differential, comprehensive metabolic panel, and lipase will be obtained and she will be scheduled for an abdominal ultrasound to evaluate for possible choledocholithiasis or ductal dilatation in light of her postprandial right upper quadrant pain. She will follow-up in 3-4 weeks, sooner if needed.        Katoya Amato, Deloris Ping 07/28/2015,

## 2015-07-29 ENCOUNTER — Other Ambulatory Visit: Payer: Self-pay | Admitting: *Deleted

## 2015-07-29 DIAGNOSIS — M5032 Other cervical disc degeneration, mid-cervical region, unspecified level: Secondary | ICD-10-CM | POA: Diagnosis not present

## 2015-07-29 DIAGNOSIS — M5416 Radiculopathy, lumbar region: Secondary | ICD-10-CM | POA: Diagnosis not present

## 2015-07-29 DIAGNOSIS — R1013 Epigastric pain: Secondary | ICD-10-CM

## 2015-07-29 NOTE — Progress Notes (Signed)
Reviewed and agree with management plan.  Charletha Dalpe T. Vinessa Macconnell, MD FACG 

## 2015-07-31 ENCOUNTER — Ambulatory Visit
Admission: RE | Admit: 2015-07-31 | Discharge: 2015-07-31 | Disposition: A | Discharge status: Home / Self Care | Payer: 59 | Source: Ambulatory Visit | Attending: Physician Assistant | Admitting: Physician Assistant

## 2015-07-31 DIAGNOSIS — R1013 Epigastric pain: Secondary | ICD-10-CM | POA: Diagnosis not present

## 2015-07-31 DIAGNOSIS — R6881 Early satiety: Secondary | ICD-10-CM

## 2015-07-31 DIAGNOSIS — K219 Gastro-esophageal reflux disease without esophagitis: Secondary | ICD-10-CM

## 2015-07-31 DIAGNOSIS — K5900 Constipation, unspecified: Secondary | ICD-10-CM

## 2015-08-04 ENCOUNTER — Ambulatory Visit: Payer: 59 | Admitting: Physical Therapy

## 2015-08-06 ENCOUNTER — Encounter: Payer: Self-pay | Admitting: Physical Therapy

## 2015-08-06 ENCOUNTER — Ambulatory Visit: Payer: 59 | Attending: Sports Medicine | Admitting: Physical Therapy

## 2015-08-06 DIAGNOSIS — M25512 Pain in left shoulder: Secondary | ICD-10-CM | POA: Diagnosis not present

## 2015-08-06 DIAGNOSIS — M7582 Other shoulder lesions, left shoulder: Secondary | ICD-10-CM | POA: Diagnosis not present

## 2015-08-06 DIAGNOSIS — M25612 Stiffness of left shoulder, not elsewhere classified: Secondary | ICD-10-CM

## 2015-08-06 NOTE — Therapy (Signed)
[place], [place], [address] Phone: [phone]   Fax:  [phone]  Physical Therapy Treatment  Patient Details  Name: [name] MRN: [id number] Date of Birth: July 30, 1965 No Data Recorded  Encounter Date: 08/06/2015      PT End of Session - 08/06/15 1101    Visit Number 4   PT Start Time [id number]   PT Stop Time 1116   PT Time Calculation (min) 61 min   Activity Tolerance Patient limited by pain      Past Medical History  Diagnosis Date  . Arthritis   . Overweight(278.02)   . Anemia   . Personal history of colonic polyps 09/19/2011    tubular adenoma  . GERD (gastroesophageal reflux disease)   . 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 Irritable bowel syndrome     Past Surgical History  Procedure Laterality Date  . Spine surgery  10/13/06    Posterior interbody fusion (PLIF) L5-S1 (07/04/07)  . Knee surgery       Right knee arthroscopic partial lateral meniscectomy (01/22/07),arthroscopic partial lateral meniscectomy 5/09  . Fracture surgery  2011    Due to injury at work in 2008.  . Facet joint injection  03/28/11    injections in neck    There were no vitals filed for this visit.  Visit Diagnosis:  Decreased shoulder mobility, left  Pain in joint of left shoulder      Subjective Assessment - 08/06/15 1015    Subjective Pt reports that she had an injection last week in low back, pt reports that she has been experiencing L shoulder and low back pain.    Currently in Pain? Yes   Pain Score 5    Pain Location Shoulder   Pain Orientation Left            [facility] PT Assessment - 08/06/15 0001    AROM   Left Shoulder Flexion 123 Degrees   Left Shoulder ABduction 110 Degrees                     [facility] Adult PT Treatment/Exercise - 08/06/15 0001    Shoulder Exercises: Seated   Row 10 reps;Weights  2 sets   Row Weight (lbs) 15   Other Seated Exercises Lat pull downs #15  2x10   Other Seated Exercises NuStep L3 x4 min   Shoulder Exercises: Standing   External Rotation Theraband;15 reps  2 sets    Theraband Level (Shoulder External Rotation) Level 1 (Yellow)   Flexion 10 reps;Weights  2 sets    Shoulder Flexion Weight (lbs) 2   ABduction 10 reps;Weights  2 sets    Shoulder ABduction Weight (lbs) 1   Extension 10 reps;Weights  2 sets with cane    Extension Weight (lbs) 5   Other Standing Exercises I with cane 2x10    Shoulder Exercises: ROM/Strengthening   UBE (Upper Arm Bike) level 1 40frd/3rev   Modalities   Modalities Electrical Stimulation;Moist Heat   Moist Heat Therapy   Number Minutes Moist Heat 15 Minutes   Moist Heat Location Shoulder   Electrical Stimulation   Electrical Stimulation Location L shoulder    Electrical Stimulation Action IFC   Electrical Stimulation Parameters tolerance    Electrical Stimulation Goals Pain   Manual Therapy   Manual Therapy Passive ROM   Manual therapy comments PROM taken to end range and held   Passive ROM CL shoulder  all directions                   PT Short Term Goals - 07/13/15 0942    PT SHORT TERM GOAL #1   Title Pt will be independent with HEP   Time 1   Period Weeks   Status New           PT Long Term Goals - 07/13/15 [id number]    PT LONG TERM GOAL #1   Title Pt will improve left shoulder strength to 5/5 in all planes.   Time 8   Period Weeks   Status New   PT LONG TERM GOAL #2   Title Pt will verbally report 0/10 pain with left shoulder movements.   Time 8   Period Weeks   Status New   PT LONG TERM GOAL #3   Title Pt will increase shoulder ROM by 50% in all planes.   Time 8   Period Weeks   Status New   PT LONG TERM GOAL #4   Title Pt will verbally report 0/10 pain with dressing.   Time 8   Period Weeks   Status New               Plan - 08/06/15 1102    Clinical Impression Statement Completed all exercises well, slow movements with exercises but reports no  increase in pain. L shoulder AROM taken in supine with mild improvement.   Pt will benefit from skilled therapeutic intervention in order to improve on the following deficits Decreased activity tolerance;Decreased range of motion;Decreased strength;Hypomobility;Increased muscle spasms;Impaired UE functional use;Pain   Rehab Potential Good   PT Frequency 2x / week   PT Duration 8 weeks   PT Treatment/Interventions ADLs/Self Care Home Management;Electrical Stimulation;Ultrasound;Moist Heat;Functional mobility training;Therapeutic activities;Therapeutic exercise;Patient/family education;Manual techniques;Energy conservation;Passive range of motion   PT Next Visit Plan STM, passive movements, progress exercises as tolerated        Problem List Patient Active Problem List   Diagnosis Date Noted  . Hx of adenomatous colonic polyps 11/14/2014  . Chronic pain syndrome 09/04/2013  . Epigastric abdominal tenderness 08/16/2013  . Elbow pain, right 06/30/2013  . Fibromyalgia 04/19/2012  . IBS (irritable bowel syndrome) 04/19/2012  . Joint pain 04/19/2012  . Benign neoplasm of colon 09/19/2011  . Colitis, nonspecific 09/19/2011  . Hematochezia 09/19/2011  . Abdominal pain, other specified site 09/08/2011  . GERD (gastroesophageal reflux disease) 09/08/2011  . General medical examination 04/13/2011  . Cervical disc disease 03/15/2011  . Multiple thyroid nodules 02/14/2011    Scot Jun, PTA  08/06/2015, 11:04 AM  [address] [place], [place], [address] Phone: [phone]   Fax:  [phone]  Name: [name] MRN: [id number] Date of Birth: Nov 06, 1965

## 2015-08-07 DIAGNOSIS — D729 Disorder of white blood cells, unspecified: Secondary | ICD-10-CM | POA: Diagnosis not present

## 2015-08-07 DIAGNOSIS — E559 Vitamin D deficiency, unspecified: Secondary | ICD-10-CM | POA: Diagnosis not present

## 2015-08-07 DIAGNOSIS — R946 Abnormal results of thyroid function studies: Secondary | ICD-10-CM | POA: Diagnosis not present

## 2015-08-10 ENCOUNTER — Encounter: Payer: Self-pay | Admitting: Physical Therapy

## 2015-08-10 ENCOUNTER — Ambulatory Visit: Payer: 59 | Admitting: Physical Therapy

## 2015-08-10 DIAGNOSIS — M7582 Other shoulder lesions, left shoulder: Secondary | ICD-10-CM | POA: Diagnosis not present

## 2015-08-10 DIAGNOSIS — M25612 Stiffness of left shoulder, not elsewhere classified: Secondary | ICD-10-CM

## 2015-08-10 DIAGNOSIS — M25512 Pain in left shoulder: Secondary | ICD-10-CM

## 2015-08-10 NOTE — Therapy (Signed)
[place], [place], [address] Phone: [phone]   Fax:  [phone]  Physical Therapy Treatment  Patient Details  Name: [name] MRN: [id number] Date of Birth: Oct 13, 1965 No Data Recorded  Encounter Date: 08/10/2015      PT End of Session - 08/10/15 1143    Visit Number 5   PT Start Time 1106   PT Stop Time 1156   PT Time Calculation (min) 50 min   Activity Tolerance Patient tolerated treatment well   Behavior During Therapy [facility] for tasks assessed/performed      Past Medical History  Diagnosis Date  . Arthritis   . Overweight(278.02)   . Anemia   . Personal history of colonic polyps 09/19/2011    tubular adenoma  . GERD (gastroesophageal reflux disease)   . 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 Irritable bowel syndrome     Past Surgical History  Procedure Laterality Date  . Spine surgery  10/13/06    Posterior interbody fusion (PLIF) L5-S1 (07/04/07)  . Knee surgery       Right knee arthroscopic partial lateral meniscectomy (01/22/07),arthroscopic partial lateral meniscectomy 5/09  . Fracture surgery  2011    Due to injury at work in 2008.  . Facet joint injection  03/28/11    injections in neck    There were no vitals filed for this visit.  Visit Diagnosis:  Pain in joint of left shoulder  Decreased shoulder mobility, left      Subjective Assessment - 08/10/15 1107    Subjective Pt stated that she had a ok weekend, Pt reports that she gotten a  new medication Friday for her LBP.    Currently in Pain? Yes   Pain Score 3    Pain Location Shoulder   Pain Orientation Left                         [facility] Adult PT Treatment/Exercise - 08/10/15 0001    Shoulder Exercises: Seated   Row Weights;15 reps  2 sets    Row Weight (lbs) 15   Other Seated Exercises Lat pull downs #15 2x15   Other Seated Exercises NuStep L3 x5 min   Shoulder Exercises: Standing   Horizontal ABduction 10 reps;Theraband  2 sets    Theraband Level (Shoulder Horizontal ABduction) Level 2 (Red)   External Rotation 10 reps;Theraband  2 sets    Theraband Level (Shoulder External Rotation) Level 2 (Red)   Shoulder Exercises: ROM/Strengthening   UBE (Upper Arm Bike) level 1 50fd/3rev   Modalities   Modalities Electrical Stimulation;Moist Heat   Moist Heat Therapy   Number Minutes Moist Heat 15 Minutes   Moist Heat Location Shoulder   Electrical Stimulation   Electrical Stimulation Location L shoulder    Electrical Stimulation Action IFC   Electrical Stimulation Parameters tolerance    Electrical Stimulation Goals Pain   Manual Therapy   Manual Therapy Passive ROM   Manual therapy comments PROM taken to end range and held   Passive ROM L shoulder all directions                   PT Short Term Goals - 07/13/15 00086   PT SHORT TERM GOAL #1   Title Pt will be independent with HEP   Time 1   Period Weeks   Status New  PT Long Term Goals - 08/10/15 1148    PT LONG TERM GOAL #2   Title Pt will verbally report 0/10 pain with left shoulder movements.   Status On-going   PT LONG TERM GOAL #3   Title Pt will increase shoulder ROM by 50% in all planes.   Status Partially Met               Plan - 08/10/15 1144    Clinical Impression Statement Pt 6 minutes late for PT treatment. Pt performed all exercises this date. Slow movement with UBE forward and reverse, pt afraid to increase speed in fear of pain. Full PROM maintain with PROM, Pt reports that  PROM does hurt but  pushes through.   Pt will benefit from skilled therapeutic intervention in order to improve on the following deficits Decreased activity tolerance;Decreased range of motion;Decreased strength;Hypomobility;Increased muscle spasms;Impaired UE functional use;Pain   Rehab Potential Good   PT Frequency 2x / week   PT Duration 8 weeks   PT Treatment/Interventions ADLs/Self  Care Home Management;Electrical Stimulation;Ultrasound;Moist Heat;Functional mobility training;Therapeutic activities;Therapeutic exercise;Patient/family education;Manual techniques;Energy conservation;Passive range of motion   PT Next Visit Plan STM, passive movements, progress exercises as tolerated        Problem List Patient Active Problem List   Diagnosis Date Noted  . Hx of adenomatous colonic polyps 11/14/2014  . Chronic pain syndrome 09/04/2013  . Epigastric abdominal tenderness 08/16/2013  . Elbow pain, right 06/30/2013  . Fibromyalgia 04/19/2012  . IBS (irritable bowel syndrome) 04/19/2012  . Joint pain 04/19/2012  . Benign neoplasm of colon 09/19/2011  . Colitis, nonspecific 09/19/2011  . Hematochezia 09/19/2011  . Abdominal pain, other specified site 09/08/2011  . GERD (gastroesophageal reflux disease) 09/08/2011  . General medical examination 04/13/2011  . Cervical disc disease 03/15/2011  . Multiple thyroid nodules 02/14/2011    Scot Jun, PTA  08/10/2015, 11:49 AM  [address] [place], [place], [address] Phone: [phone]   Fax:  [phone]  Name: MARILYNE HASELEY MRN: [id number] Date of Birth: [birth year]

## 2015-08-12 ENCOUNTER — Encounter: Payer: Self-pay | Admitting: Physical Therapy

## 2015-08-12 ENCOUNTER — Ambulatory Visit: Payer: 59 | Admitting: Physical Therapy

## 2015-08-12 DIAGNOSIS — M25612 Stiffness of left shoulder, not elsewhere classified: Secondary | ICD-10-CM

## 2015-08-12 DIAGNOSIS — M25512 Pain in left shoulder: Secondary | ICD-10-CM | POA: Diagnosis not present

## 2015-08-12 DIAGNOSIS — M7582 Other shoulder lesions, left shoulder: Secondary | ICD-10-CM | POA: Diagnosis not present

## 2015-08-12 NOTE — Therapy (Addendum)
[address] [place], [place], [address] Phone: [phone]   Fax:  [phone]  Physical Therapy Treatment  Patient Details  Name: [name] MRN: [id number] Date of Birth: 03/02/1966 No Data Recorded  Encounter Date: 08/12/2015      PT End of Session - 08/12/15 1229    Visit Number 6   Date for PT Re-Evaluation 08/31/15   PT Start Time 1147   PT Stop Time 1244   PT Time Calculation (min) 57 min   Activity Tolerance Patient tolerated treatment well   Behavior During Therapy [facility] for tasks assessed/performed      Past Medical History  Diagnosis Date  . Arthritis   . Overweight(278.02)   . Anemia   . Personal history of colonic polyps 09/19/2011    tubular adenoma  . GERD (gastroesophageal reflux disease)   . 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 Irritable bowel syndrome     Past Surgical History  Procedure Laterality Date  . Spine surgery  10/13/06    Posterior interbody fusion (PLIF) L5-S1 (07/04/07)  . Knee surgery       Right knee arthroscopic partial lateral meniscectomy (01/22/07),arthroscopic partial lateral meniscectomy 5/09  . Fracture surgery  2011    Due to injury at work in 2008.  . Facet joint injection  03/28/11    injections in neck    There were no vitals filed for this visit.  Visit Diagnosis:  Decreased shoulder mobility, left  Pain in joint of left shoulder      Subjective Assessment - 08/12/15 1148    Subjective Pt reports pain after last treatment but it calmed down later that day.   Currently in Pain? Yes   Pain Score 3    Pain Location Shoulder   Pain Orientation Left            [facility] PT Assessment - 08/12/15 0001    AROM   Left Shoulder Flexion 126 Degrees   Left Shoulder ABduction 120 Degrees   Cervical Flexion 25% limited   Cervical Extension 25% limited   Cervical - Right Side Bend 50% limited   Cervical - Left Side Bend 50% limited   Cervical - Right  Rotation 25% limited   Cervical - Left Rotation 25% limited                     [facility] Adult PT Treatment/Exercise - 08/12/15 0001    Shoulder Exercises: Seated   Row Weights;10 reps  2 sets    Row Weight (lbs) 20,15    Other Seated Exercises Lat pull downs #20 x10 , #15 x10   Other Seated Exercises NuStep L3 x5 min   Shoulder Exercises: Standing   Internal Rotation 10 reps  2 sets, with cane    Flexion Both;15 reps;Weights   Shoulder Flexion Weight (lbs) 1   ABduction Both;15 reps;Weights   Shoulder ABduction Weight (lbs) 1   Extension 10 reps;Weights  2 sets, cane    Extension Weight (lbs) 5   Other Standing Exercises OHP with cane 2x10    Shoulder Exercises: ROM/Strengthening   UBE (Upper Arm Bike) level 1 91fd/3rev                  PT Short Term Goals - 07/13/15 0942    PT SHORT TERM GOAL #1   Title Pt will be independent with HEP   Time 1   Period Weeks  Status New           PT Long Term Goals - 08/10/15 1148    PT LONG TERM GOAL #2   Title Pt will verbally report 0/10 pain with left shoulder movements.   Status On-going   PT LONG TERM GOAL #3   Title Pt will increase shoulder ROM by 50% in all planes.   Status Partially Met               Plan - 08/12/15 1231    Clinical Impression Statement Pt reports pain anterior L shoulder that does not go away. Minor increase in L shoulder AROM, No increase in cervical ROM but pt reports that she does have some degeneration and stenosis in her cervical spine. All interventions performed good but with slow movement.   Pt will benefit from skilled therapeutic intervention in order to improve on the following deficits Decreased activity tolerance;Decreased range of motion;Decreased strength;Hypomobility;Increased muscle spasms;Impaired UE functional use;Pain   Rehab Potential Good   PT Frequency 2x / week   PT Duration 8 weeks   PT Treatment/Interventions ADLs/Self Care Home  Management;Electrical Stimulation;Ultrasound;Moist Heat;Functional mobility training;Therapeutic activities;Therapeutic exercise;Patient/family education;Manual techniques;Energy conservation;Passive range of motion   PT Next Visit Plan STM, passive movements, progress exercises as tolerated        Problem List Patient Active Problem List   Diagnosis Date Noted  . Hx of adenomatous colonic polyps 11/14/2014  . Chronic pain syndrome 09/04/2013  . Epigastric abdominal tenderness 08/16/2013  . Elbow pain, right 06/30/2013  . Fibromyalgia 04/19/2012  . IBS (irritable bowel syndrome) 04/19/2012  . Joint pain 04/19/2012  . Benign neoplasm of colon 09/19/2011  . Colitis, nonspecific 09/19/2011  . Hematochezia 09/19/2011  . Abdominal pain, other specified site 09/08/2011  . GERD (gastroesophageal reflux disease) 09/08/2011  . General medical examination 04/13/2011  . Cervical disc disease 03/15/2011  . Multiple thyroid nodules 02/14/2011    PHYSICAL THERAPY DISCHARGE SUMMARY  Visits from Start of Care: 6   Plan: Patient agrees to discharge.  Patient goals were partially met. Patient is being discharged due to not returning since the last visit.  ?????       Scot Jun, PTA 08/12/2015, 12:33 PM  [address] [place], [place], [address] Phone: [phone]   Fax:  [phone]  Name: [name] MRN: [id number] Date of Birth: Sep 17, 1965

## 2015-08-13 DIAGNOSIS — M461 Sacroiliitis, not elsewhere classified: Secondary | ICD-10-CM | POA: Diagnosis not present

## 2015-08-13 DIAGNOSIS — M545 Low back pain: Secondary | ICD-10-CM | POA: Diagnosis not present

## 2015-08-19 DIAGNOSIS — M461 Sacroiliitis, not elsewhere classified: Secondary | ICD-10-CM | POA: Diagnosis not present

## 2015-08-25 DIAGNOSIS — M25512 Pain in left shoulder: Secondary | ICD-10-CM | POA: Diagnosis not present

## 2015-08-25 DIAGNOSIS — M7502 Adhesive capsulitis of left shoulder: Secondary | ICD-10-CM | POA: Diagnosis not present

## 2015-08-25 DIAGNOSIS — M1711 Unilateral primary osteoarthritis, right knee: Secondary | ICD-10-CM | POA: Diagnosis not present

## 2015-08-26 ENCOUNTER — Encounter: Payer: Self-pay | Admitting: *Deleted

## 2015-08-26 ENCOUNTER — Other Ambulatory Visit (INDEPENDENT_AMBULATORY_CARE_PROVIDER_SITE_OTHER): Payer: 59

## 2015-08-26 ENCOUNTER — Ambulatory Visit (INDEPENDENT_AMBULATORY_CARE_PROVIDER_SITE_OTHER): Payer: 59 | Admitting: Gastroenterology

## 2015-08-26 ENCOUNTER — Encounter: Payer: Self-pay | Admitting: Gastroenterology

## 2015-08-26 VITALS — BP 130/68 | HR 82 | Ht 63.0 in | Wt 177.6 lb

## 2015-08-26 DIAGNOSIS — R7401 Elevation of levels of liver transaminase levels: Secondary | ICD-10-CM

## 2015-08-26 DIAGNOSIS — K589 Irritable bowel syndrome without diarrhea: Secondary | ICD-10-CM

## 2015-08-26 DIAGNOSIS — K219 Gastro-esophageal reflux disease without esophagitis: Secondary | ICD-10-CM | POA: Diagnosis not present

## 2015-08-26 DIAGNOSIS — R740 Nonspecific elevation of levels of transaminase and lactic acid dehydrogenase [LDH]: Secondary | ICD-10-CM

## 2015-08-26 DIAGNOSIS — R1013 Epigastric pain: Secondary | ICD-10-CM | POA: Diagnosis not present

## 2015-08-26 LAB — HEPATIC FUNCTION PANEL
ALT: 10 U/L (ref 0–35)
AST: 13 U/L (ref 0–37)
Albumin: 4.1 g/dL (ref 3.5–5.2)
Alkaline Phosphatase: 83 U/L (ref 39–117)
BILIRUBIN DIRECT: 0.1 mg/dL (ref 0.0–0.3)
BILIRUBIN TOTAL: 0.3 mg/dL (ref 0.2–1.2)
TOTAL PROTEIN: 8.3 g/dL (ref 6.0–8.3)

## 2015-08-26 LAB — GAMMA GT: GGT: 32 U/L (ref 7–51)

## 2015-08-26 NOTE — Progress Notes (Signed)
    History of Present Illness: This is a 50 year old female returning for follow-up of GERD. She recently had a flare of symptoms and was seen by Cecille Rubin Hvozdovic, PAC. Her reflux symptoms have come under good control on pantoprazole twice daily. ALT was slightly elevated at 46. Abdominal ultrasound was unremarkable. Patient has had good control of her reflux symptoms since that visit. She is occasionally taking pantoprazole 3 times a day.  Current Medications, Allergies, Past Medical History, Past Surgical History, Family History and Social History were reviewed in [employer] record.  Physical Exam: General: Well developed, well nourished, no acute distress Head: Normocephalic and atraumatic Eyes:  sclerae anicteric, EOMI Ears: Normal auditory acuity Mouth: No deformity or lesions Lungs: Clear throughout to auscultation Heart: Regular rate and rhythm; no murmurs, rubs or bruits Abdomen: Soft, non tender and non distended. No masses, hepatosplenomegaly or hernias noted. Normal Bowel sounds Musculoskeletal: Symmetrical with no gross deformities  Pulses:  Normal pulses noted Extremities: No clubbing, cyanosis, edema or deformities noted Neurological: Alert oriented x 4, grossly nonfocal Psychological:  Alert and cooperative. Normal mood and affect  Assessment and Recommendations:  1. GERD. Follow all standard antireflux measures continue pantoprazole 40 mg twice daily taken before breakfast and dinner. May use Pepcid AC, Pepcid Complete or Tums for breakthrough symptoms.  2. IBS. Continue dicyclomine 10 mg 3 times a day before meals when necessary.  3. Personal history of adenomatous colon polyps. Five-year interval surveillance colonoscopy recommended March 2018.  4. Mildly elevated ALT. Abd [place] negative. Repeat LFTs and obtain a gamma GT today.

## 2015-08-26 NOTE — Patient Instructions (Signed)
Your physician has requested that you go to the basement for  lab work before leaving today.  Please take your Protonix twice daily before breakfast and dinner.  Thank you for choosing me and [facility].  Pricilla Riffle. Dagoberto Ligas., MD., Marval Regal

## 2015-09-03 DIAGNOSIS — G8929 Other chronic pain: Secondary | ICD-10-CM | POA: Diagnosis not present

## 2015-09-03 DIAGNOSIS — M25512 Pain in left shoulder: Secondary | ICD-10-CM | POA: Diagnosis not present

## 2015-09-09 DIAGNOSIS — M25512 Pain in left shoulder: Secondary | ICD-10-CM | POA: Diagnosis not present

## 2015-09-09 DIAGNOSIS — M7542 Impingement syndrome of left shoulder: Secondary | ICD-10-CM | POA: Diagnosis not present

## 2015-09-14 DIAGNOSIS — M461 Sacroiliitis, not elsewhere classified: Secondary | ICD-10-CM | POA: Diagnosis not present

## 2015-10-07 DIAGNOSIS — M7542 Impingement syndrome of left shoulder: Secondary | ICD-10-CM | POA: Diagnosis not present

## 2015-10-07 DIAGNOSIS — M25512 Pain in left shoulder: Secondary | ICD-10-CM | POA: Diagnosis not present

## 2015-11-19 ENCOUNTER — Other Ambulatory Visit: Payer: Self-pay | Admitting: Gastroenterology

## 2015-12-06 ENCOUNTER — Other Ambulatory Visit: Payer: Self-pay | Admitting: Gastroenterology

## 2016-06-01 ENCOUNTER — Other Ambulatory Visit: Payer: Self-pay | Admitting: Gastroenterology

## 2016-06-23 ENCOUNTER — Other Ambulatory Visit: Payer: Self-pay | Admitting: Neurology

## 2016-06-23 DIAGNOSIS — G44321 Chronic post-traumatic headache, intractable: Secondary | ICD-10-CM | POA: Diagnosis not present

## 2016-06-23 DIAGNOSIS — M542 Cervicalgia: Secondary | ICD-10-CM | POA: Diagnosis not present

## 2016-06-23 DIAGNOSIS — M791 Myalgia: Secondary | ICD-10-CM | POA: Diagnosis not present

## 2016-06-23 DIAGNOSIS — G4489 Other headache syndrome: Secondary | ICD-10-CM | POA: Diagnosis not present

## 2016-06-23 DIAGNOSIS — G4700 Insomnia, unspecified: Secondary | ICD-10-CM | POA: Diagnosis not present

## 2016-06-23 DIAGNOSIS — G43019 Migraine without aura, intractable, without status migrainosus: Secondary | ICD-10-CM | POA: Diagnosis not present

## 2016-06-23 DIAGNOSIS — G4453 Primary thunderclap headache: Secondary | ICD-10-CM | POA: Diagnosis not present

## 2016-06-24 ENCOUNTER — Other Ambulatory Visit: Payer: Self-pay | Admitting: Neurology

## 2016-06-24 DIAGNOSIS — R51 Headache: Principal | ICD-10-CM

## 2016-06-24 DIAGNOSIS — R519 Headache, unspecified: Secondary | ICD-10-CM

## 2016-07-01 ENCOUNTER — Telehealth: Payer: Self-pay | Admitting: Gastroenterology

## 2016-07-03 ENCOUNTER — Other Ambulatory Visit: Payer: 59

## 2016-07-03 ENCOUNTER — Inpatient Hospital Stay: Admission: RE | Admit: 2016-07-03 | Payer: 59 | Source: Ambulatory Visit

## 2016-07-04 ENCOUNTER — Other Ambulatory Visit: Payer: Self-pay

## 2016-07-04 MED ORDER — PANTOPRAZOLE SODIUM 40 MG PO TBEC: 40.0000 mg | DELAYED_RELEASE_TABLET | Freq: Two times a day (BID) | ORAL | 0 refills | Status: DC

## 2016-07-04 NOTE — Telephone Encounter (Signed)
Left a message for patient to return my call. 

## 2016-07-04 NOTE — Telephone Encounter (Signed)
Spoke with pharmacist and they have been giving patient bentyl 20 mg instead of bentyl 10 mg that we prescribed since May. Pharmacist wants to know if we want to continue with the 20 mg or switch her back to 10 mg dosing. Spoke with Dr. Fuller Plan and he is fine keeping patient on 20 mg as long her symptoms are under control.

## 2016-07-05 NOTE — Telephone Encounter (Signed)
Spoke with patient and informed her that as long as she is having no issues or her symptoms are under control on bentyl 20 mg than she can just continue that dosage. Patient states she has been doing great on Bentyl 20 mg and would like to continue. Also informed her that I refilled her pantoprazole prescription as well. Patient verbalized understanding.

## 2016-07-19 DIAGNOSIS — S335XXA Sprain of ligaments of lumbar spine, initial encounter: Secondary | ICD-10-CM | POA: Diagnosis not present

## 2016-07-19 DIAGNOSIS — M25552 Pain in left hip: Secondary | ICD-10-CM | POA: Diagnosis not present

## 2016-07-27 ENCOUNTER — Encounter: Payer: Self-pay | Admitting: Gastroenterology

## 2016-08-07 ENCOUNTER — Other Ambulatory Visit: Payer: Self-pay | Admitting: Gastroenterology

## 2016-08-11 ENCOUNTER — Other Ambulatory Visit: Payer: Self-pay | Admitting: Gastroenterology

## 2016-08-16 DIAGNOSIS — M461 Sacroiliitis, not elsewhere classified: Secondary | ICD-10-CM | POA: Diagnosis not present

## 2016-08-16 DIAGNOSIS — M545 Low back pain: Secondary | ICD-10-CM | POA: Diagnosis not present

## 2016-09-05 ENCOUNTER — Other Ambulatory Visit: Payer: Self-pay | Admitting: Gastroenterology

## 2016-09-12 DIAGNOSIS — M7711 Lateral epicondylitis, right elbow: Secondary | ICD-10-CM | POA: Diagnosis not present

## 2016-09-12 DIAGNOSIS — G894 Chronic pain syndrome: Secondary | ICD-10-CM | POA: Diagnosis not present

## 2016-09-16 ENCOUNTER — Other Ambulatory Visit: Payer: Self-pay | Admitting: Gastroenterology

## 2016-10-04 ENCOUNTER — Other Ambulatory Visit: Payer: Self-pay | Admitting: Gastroenterology

## 2016-10-12 ENCOUNTER — Telehealth: Payer: Self-pay | Admitting: Gastroenterology

## 2016-10-12 MED ORDER — DICYCLOMINE HCL 20 MG PO TABS: ORAL_TABLET | ORAL | 0 refills | Status: DC

## 2016-10-12 MED ORDER — PANTOPRAZOLE SODIUM 40 MG PO TBEC: 40.0000 mg | DELAYED_RELEASE_TABLET | Freq: Two times a day (BID) | ORAL | 0 refills | Status: DC

## 2016-10-12 NOTE — Telephone Encounter (Signed)
Prescription sent to patient's pharmacy and patient notified notified to keep appt for further refills.

## 2016-10-21 DIAGNOSIS — M7989 Other specified soft tissue disorders: Secondary | ICD-10-CM | POA: Diagnosis not present

## 2016-10-24 DIAGNOSIS — M79644 Pain in right finger(s): Secondary | ICD-10-CM | POA: Diagnosis not present

## 2016-10-24 DIAGNOSIS — M7989 Other specified soft tissue disorders: Secondary | ICD-10-CM | POA: Diagnosis not present

## 2016-11-07 ENCOUNTER — Telehealth: Payer: Self-pay | Admitting: Gastroenterology

## 2016-11-07 ENCOUNTER — Ambulatory Visit: Payer: 59 | Admitting: Gastroenterology

## 2016-11-07 NOTE — Telephone Encounter (Signed)
Do you want to charge? 

## 2016-11-07 NOTE — Telephone Encounter (Signed)
Yes charge 

## 2016-11-09 DIAGNOSIS — M7989 Other specified soft tissue disorders: Secondary | ICD-10-CM | POA: Diagnosis not present

## 2016-11-10 ENCOUNTER — Other Ambulatory Visit: Payer: Self-pay | Admitting: Gastroenterology

## 2016-11-14 DIAGNOSIS — M545 Low back pain: Secondary | ICD-10-CM | POA: Diagnosis not present

## 2016-11-14 DIAGNOSIS — M461 Sacroiliitis, not elsewhere classified: Secondary | ICD-10-CM | POA: Diagnosis not present

## 2016-11-23 DIAGNOSIS — M7989 Other specified soft tissue disorders: Secondary | ICD-10-CM | POA: Diagnosis not present

## 2016-11-23 DIAGNOSIS — G894 Chronic pain syndrome: Secondary | ICD-10-CM | POA: Diagnosis not present

## 2016-12-09 ENCOUNTER — Other Ambulatory Visit: Payer: Self-pay | Admitting: Gastroenterology

## 2016-12-19 ENCOUNTER — Encounter: Payer: Self-pay | Admitting: Gastroenterology

## 2016-12-19 ENCOUNTER — Ambulatory Visit (INDEPENDENT_AMBULATORY_CARE_PROVIDER_SITE_OTHER): Payer: Medicare Other | Admitting: Gastroenterology

## 2016-12-19 ENCOUNTER — Encounter (INDEPENDENT_AMBULATORY_CARE_PROVIDER_SITE_OTHER): Payer: Self-pay

## 2016-12-19 VITALS — BP 130/80 | HR 80 | Ht 62.5 in | Wt 203.4 lb

## 2016-12-19 DIAGNOSIS — K588 Other irritable bowel syndrome: Secondary | ICD-10-CM | POA: Diagnosis not present

## 2016-12-19 DIAGNOSIS — K219 Gastro-esophageal reflux disease without esophagitis: Secondary | ICD-10-CM

## 2016-12-19 MED ORDER — PANTOPRAZOLE SODIUM 40 MG PO TBEC: 40.0000 mg | DELAYED_RELEASE_TABLET | Freq: Two times a day (BID) | ORAL | 11 refills | Status: DC

## 2016-12-19 MED ORDER — DICYCLOMINE HCL 20 MG PO TABS: 20.0000 mg | ORAL_TABLET | Freq: Three times a day (TID) | ORAL | 11 refills | Status: DC

## 2016-12-19 NOTE — Patient Instructions (Signed)
We have sent the following medications to your pharmacy for you to pick up at your convenience: dicyclomine and pantoprazole.   Thank you for choosing me and [facility].  Pricilla Riffle. Dagoberto Ligas., MD., Marval Regal

## 2016-12-19 NOTE — Progress Notes (Signed)
    History of Present Illness: This is a 51 year old female returning for follow-up of GERD and IBS. She states her symptoms remain under very control with regular use of medications. Occasionally certain foods will make her IBS or GERD flare. This is infrequent as she generally avoids foods that are bothersome.  Current Medications, Allergies, Past Medical History, Past Surgical History, Family History and Social History were reviewed in [employer] record.  Physical Exam: General: Well developed, well nourished, no acute distress Head: Normocephalic and atraumatic Eyes:  sclerae anicteric, EOMI Ears: Normal auditory acuity Mouth: No deformity or lesions Lungs: Clear throughout to auscultation Heart: Regular rate and rhythm; no murmurs, rubs or bruits Abdomen: Soft, non tender and non distended. No masses, hepatosplenomegaly or hernias noted. Normal Bowel sounds Musculoskeletal: Symmetrical with no gross deformities  Pulses:  Normal pulses noted Extremities: No clubbing, cyanosis, edema or deformities noted Neurological: Alert oriented x 4, grossly nonfocal Psychological:  Alert and cooperative. Normal mood and affect  Assessment and Recommendations:   1. GERD. Follow antireflux measures. Continue pantoprazole 40 mg daily.  2. IBS. Continue dicyclomine 10 mg 3 times a day before meals. REV in 1 year.

## 2017-01-11 DIAGNOSIS — M7989 Other specified soft tissue disorders: Secondary | ICD-10-CM | POA: Diagnosis not present

## 2017-01-11 DIAGNOSIS — G894 Chronic pain syndrome: Secondary | ICD-10-CM | POA: Diagnosis not present

## 2017-02-13 DIAGNOSIS — M545 Low back pain: Secondary | ICD-10-CM | POA: Diagnosis not present

## 2017-02-13 DIAGNOSIS — M461 Sacroiliitis, not elsewhere classified: Secondary | ICD-10-CM | POA: Diagnosis not present

## 2017-02-13 DIAGNOSIS — M25561 Pain in right knee: Secondary | ICD-10-CM | POA: Diagnosis not present

## 2017-05-15 DIAGNOSIS — M545 Low back pain: Secondary | ICD-10-CM | POA: Diagnosis not present

## 2017-05-15 DIAGNOSIS — M25561 Pain in right knee: Secondary | ICD-10-CM | POA: Diagnosis not present

## 2017-05-15 DIAGNOSIS — M461 Sacroiliitis, not elsewhere classified: Secondary | ICD-10-CM | POA: Diagnosis not present

## 2017-05-17 DIAGNOSIS — G894 Chronic pain syndrome: Secondary | ICD-10-CM | POA: Diagnosis not present

## 2017-08-29 DIAGNOSIS — M25521 Pain in right elbow: Secondary | ICD-10-CM | POA: Diagnosis not present

## 2017-08-29 DIAGNOSIS — M25522 Pain in left elbow: Secondary | ICD-10-CM | POA: Insufficient documentation

## 2017-08-29 DIAGNOSIS — M25512 Pain in left shoulder: Secondary | ICD-10-CM | POA: Diagnosis not present

## 2017-08-29 DIAGNOSIS — G894 Chronic pain syndrome: Secondary | ICD-10-CM | POA: Diagnosis not present

## 2017-08-30 DIAGNOSIS — M545 Low back pain: Secondary | ICD-10-CM | POA: Diagnosis not present

## 2017-08-30 DIAGNOSIS — M461 Sacroiliitis, not elsewhere classified: Secondary | ICD-10-CM | POA: Diagnosis not present

## 2017-08-30 DIAGNOSIS — M25561 Pain in right knee: Secondary | ICD-10-CM | POA: Diagnosis not present

## 2017-09-19 ENCOUNTER — Telehealth: Payer: Self-pay | Admitting: Gastroenterology

## 2017-09-19 NOTE — Telephone Encounter (Signed)
Patient states that for last couple of days her GERD symptoms have worsened. She has been taking pantoprazole 40 mg once a day, along with Pepcid for last two days. She has been trying to observe GERD protocol, may not have been following the guidelines as well as she should have been. Please advise.

## 2017-09-19 NOTE — Telephone Encounter (Signed)
Patient advised to closely follow antireflux measures closely, increase pantoprazole 40 mg to BID for one month. She will let us know if symptoms do not improve.

## 2017-09-19 NOTE — Telephone Encounter (Signed)
Follow antireflux measures very closely Increase pantoprazole 40 mg po bid for 1 month then return to qd

## 2017-10-04 DIAGNOSIS — M461 Sacroiliitis, not elsewhere classified: Secondary | ICD-10-CM | POA: Diagnosis not present

## 2017-10-04 DIAGNOSIS — M545 Low back pain: Secondary | ICD-10-CM | POA: Diagnosis not present

## 2017-10-04 DIAGNOSIS — M25561 Pain in right knee: Secondary | ICD-10-CM | POA: Diagnosis not present

## 2017-11-17 DIAGNOSIS — M25522 Pain in left elbow: Secondary | ICD-10-CM | POA: Diagnosis not present

## 2017-11-17 DIAGNOSIS — M25521 Pain in right elbow: Secondary | ICD-10-CM | POA: Diagnosis not present

## 2017-11-17 DIAGNOSIS — G894 Chronic pain syndrome: Secondary | ICD-10-CM | POA: Diagnosis not present

## 2017-11-23 ENCOUNTER — Telehealth: Payer: Self-pay | Admitting: Gastroenterology

## 2017-11-23 NOTE — Telephone Encounter (Signed)
Patient with several days of abdominal pain and vomiting.  She has seen some blood in her stool.  She will come in and see Tye Savoy RNP tomorrow at 8:30

## 2017-11-24 ENCOUNTER — Ambulatory Visit (INDEPENDENT_AMBULATORY_CARE_PROVIDER_SITE_OTHER): Payer: 59 | Admitting: Nurse Practitioner

## 2017-11-24 ENCOUNTER — Encounter: Payer: Self-pay | Admitting: Nurse Practitioner

## 2017-11-24 VITALS — BP 120/72 | HR 80 | Ht 62.5 in | Wt 203.0 lb

## 2017-11-24 DIAGNOSIS — Z86010 Personal history of colonic polyps: Secondary | ICD-10-CM | POA: Diagnosis not present

## 2017-11-24 DIAGNOSIS — K219 Gastro-esophageal reflux disease without esophagitis: Secondary | ICD-10-CM | POA: Diagnosis not present

## 2017-11-24 DIAGNOSIS — K589 Irritable bowel syndrome without diarrhea: Secondary | ICD-10-CM

## 2017-11-24 MED ORDER — NA SULFATE-K SULFATE-MG SULF 17.5-3.13-1.6 GM/177ML PO SOLN: ORAL | 0 refills | Status: DC

## 2017-11-24 MED ORDER — HYOSCYAMINE SULFATE 0.125 MG SL SUBL: 0.1250 mg | SUBLINGUAL_TABLET | SUBLINGUAL | 0 refills | Status: DC | PRN

## 2017-11-24 NOTE — Patient Instructions (Addendum)
If you are age 50 or older, your body mass index should be between 23-30. Your Body mass index is 36.54 kg/m. If this is out of the aforementioned range listed, please consider follow up with your Primary Care Provider.  If you are age 26 or younger, your body mass index should be between 19-25. Your Body mass index is 36.54 kg/m. If this is out of the aformentioned range listed, please consider follow up with your Primary Care Provider.   You have been scheduled for a colonoscopy. Please follow written instructions given to you at your visit today.  Please pick up your prep supplies at the pharmacy within the next 1-3 days. If you use inhalers (even only as needed), please bring them with you on the day of your procedure. Your physician has requested that you go to www.startemmi.com and enter the access code given to you at your visit today. This web site gives a general overview about your procedure. However, you should still follow specific instructions given to you by our office regarding your preparation for the procedure.  We have sent the following medications to your pharmacy for you to pick up at your convenience:  Suprep Levsin   Thank you for choosing me and [facility].   Tye Savoy, NP

## 2017-11-24 NOTE — Progress Notes (Signed)
      IMPRESSION and PLAN:    #53. 52 year old female with hx of IBS. Takes Bentyl TID on a regular basis. Every couple of years has episode of crampy diarrhea last all day. Last episode was this past Saturday. She had associated painless rectal bleeding at the end of the day Saturday -Suspect bleeding was perianal in nature. It has resolved and BMs returning to baseline. Due for colonoscopy so will evaluate further at that time -ok to continue Bentyl, suprisingly it doesn't constipate her.  -trial of Levsin SL #15 to use as needed when she has these rare episodes of crampy diarrhea.    #2.  Hx of adenomatous colon polyps in 2013.  -Overdue for surveillance colonoscopy which she is agreeable to getting scheduled now. The risks and benefits of colonoscopy with possible polypectomy were discussed and the patient agrees to proceed.    HPI:    Chief Complaint: rectal bleeding   Patient is a 52 year old female known previously to Dr. Sharlett Iles, now followed by Dr. Fuller Plan.  She has a history of GERD , adenomatous colon polyps (2013) .  She had a complete colonoscopy with excellent prep in 2013 done to evaluate diarrhea with hematochezia.  A diminutive tubular adenoma was removed from the cecum.  Erythema and friability was found in the rectosigmoid area.  The colon was very tortuous and redundant.  Pending biopsies patient was started on mesalamine.  Rectosigmoid biopsies were unremarkable, at some point Apriso was stopped.   [name] gives a hx of IBS, takes bentyl tid every day. Typically has normal BMs with occasional episodes of crampy diarrhea . These episodes occur about once every two years and may last a few days. During these episodes she puts herself on a soft, bland diet. Usually takes a few days to recover, feels drained afterwards. She had an episode a couple of years ago and had another this past Saturday. At the end of the day, after multiple episodes of diarrhea, patient passed some blood  and this alarmed her.   Review of systems:     No chest pain, no sob, no urinary sx  Past Medical History:  Diagnosis Date  . Anemia   . Arthritis   . GERD (gastroesophageal reflux disease)   . Irritable bowel syndrome   . Overweight(278.02)   . Personal history of colonic polyps 09/19/2011   tubular adenoma  . 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 No obvious masses, normal bowel sounds.  Rectal: no external lesions. No obvious fissures. No masses felt on DRE SKIN:  turgor, no lesions seen Musculoskeletal:  Normal muscle tone, normal strength NEURO: Alert and oriented x 3, no focal neurologic deficits   Tye Savoy , NP 11/24/2017, 8:53 AM

## 2017-11-24 NOTE — Progress Notes (Signed)
Reviewed and agree with management plan.  Linn Goetze T. Angie Piercey, MD FACG 

## 2017-12-01 ENCOUNTER — Other Ambulatory Visit: Payer: Self-pay | Admitting: Gastroenterology

## 2017-12-14 DIAGNOSIS — M461 Sacroiliitis, not elsewhere classified: Secondary | ICD-10-CM | POA: Diagnosis not present

## 2017-12-14 DIAGNOSIS — M545 Low back pain: Secondary | ICD-10-CM | POA: Diagnosis not present

## 2017-12-14 DIAGNOSIS — M25561 Pain in right knee: Secondary | ICD-10-CM | POA: Diagnosis not present

## 2018-01-10 ENCOUNTER — Encounter: Payer: Medicare Other | Admitting: Gastroenterology

## 2018-01-12 DIAGNOSIS — M545 Low back pain: Secondary | ICD-10-CM | POA: Diagnosis not present

## 2018-01-12 DIAGNOSIS — M25561 Pain in right knee: Secondary | ICD-10-CM | POA: Diagnosis not present

## 2018-01-12 DIAGNOSIS — M461 Sacroiliitis, not elsewhere classified: Secondary | ICD-10-CM | POA: Diagnosis not present

## 2018-01-18 ENCOUNTER — Other Ambulatory Visit: Payer: Self-pay | Admitting: Gastroenterology

## 2018-02-12 ENCOUNTER — Encounter: Payer: Self-pay | Admitting: Gastroenterology

## 2018-02-12 DIAGNOSIS — M545 Low back pain: Secondary | ICD-10-CM | POA: Diagnosis not present

## 2018-02-12 DIAGNOSIS — M461 Sacroiliitis, not elsewhere classified: Secondary | ICD-10-CM | POA: Diagnosis not present

## 2018-02-12 DIAGNOSIS — M25561 Pain in right knee: Secondary | ICD-10-CM | POA: Diagnosis not present

## 2018-02-23 ENCOUNTER — Telehealth: Payer: Self-pay | Admitting: Gastroenterology

## 2018-02-27 ENCOUNTER — Encounter: Payer: Medicare Other | Admitting: Gastroenterology

## 2018-03-22 DIAGNOSIS — M5030 Other cervical disc degeneration, unspecified cervical region: Secondary | ICD-10-CM | POA: Diagnosis not present

## 2018-03-28 ENCOUNTER — Ambulatory Visit (AMBULATORY_SURGERY_CENTER): Payer: Self-pay

## 2018-03-28 ENCOUNTER — Encounter: Payer: Self-pay | Admitting: Gastroenterology

## 2018-03-28 VITALS — Ht 63.0 in | Wt 202.8 lb

## 2018-03-28 DIAGNOSIS — Z86010 Personal history of colonic polyps: Secondary | ICD-10-CM

## 2018-03-28 MED ORDER — PEG 3350-KCL-NA BICARB-NACL 420 G PO SOLR
4000.0000 mL | Freq: Once | ORAL | 0 refills | Status: AC
Start: 2018-03-28 — End: 2018-03-28

## 2018-03-28 NOTE — Progress Notes (Signed)
No egg or soy allergy known to patient  No issues with past sedation with any surgeries  or procedures, no intubation problems  No diet pills per patient No home 02 use per patient  No blood thinners per patient  Pt denies issues with constipation  No A fib or A flutter  EMMI video sent to pt's e mail  Pt. declined 

## 2018-03-29 DIAGNOSIS — M542 Cervicalgia: Secondary | ICD-10-CM | POA: Diagnosis not present

## 2018-04-04 DIAGNOSIS — M542 Cervicalgia: Secondary | ICD-10-CM | POA: Diagnosis not present

## 2018-04-04 DIAGNOSIS — M5412 Radiculopathy, cervical region: Secondary | ICD-10-CM | POA: Diagnosis not present

## 2018-04-04 DIAGNOSIS — M5030 Other cervical disc degeneration, unspecified cervical region: Secondary | ICD-10-CM | POA: Diagnosis not present

## 2018-04-09 ENCOUNTER — Encounter: Payer: Self-pay | Admitting: Gastroenterology

## 2018-04-09 ENCOUNTER — Ambulatory Visit (AMBULATORY_SURGERY_CENTER): Payer: 59 | Admitting: Gastroenterology

## 2018-04-09 VITALS — BP 130/86 | HR 74 | Temp 98.4°F | Resp 22 | Ht 62.0 in | Wt 203.0 lb

## 2018-04-09 DIAGNOSIS — Z86010 Personal history of colonic polyps: Secondary | ICD-10-CM | POA: Diagnosis not present

## 2018-04-09 MED ORDER — SODIUM CHLORIDE 0.9 % IV SOLN: 500.0000 mL | Freq: Once | INTRAVENOUS | Status: DC

## 2018-04-09 NOTE — Progress Notes (Signed)
Pt's states no medical or surgical changes since previsit or office visit. 

## 2018-04-09 NOTE — Op Note (Signed)
[facility] Patient Name: [name] Procedure Date: 04/09/2018 9:40 AM MRN: [id number] Endoscopist: Ladene Artist , MD Age: 52 Referring MD:  Date of Birth: 12-26-1965 Gender: Female Account #: [id number] Procedure:                Colonoscopy Indications:              Surveillance: Personal history of adenomatous                            polyps on last colonoscopy > 5 years ago Medicines:                Monitored Anesthesia Care Procedure:                Pre-Anesthesia Assessment:                           - Prior to the procedure, a History and Physical                            was performed, and patient medications and                            allergies were reviewed. The patient's tolerance of                            previous anesthesia was also reviewed. The risks                            and benefits of the procedure and the sedation                            options and risks were discussed with the patient.                            All questions were answered, and informed consent                            was obtained. Prior Anticoagulants: The patient has                            taken no previous anticoagulant or antiplatelet                            agents. ASA Grade Assessment: II - A patient with                            mild systemic disease. After reviewing the risks                            and benefits, the patient was deemed in                            satisfactory condition to undergo the procedure.  After obtaining informed consent, the colonoscope                            was passed under direct vision. Throughout the                            procedure, the patient's blood pressure, pulse, and                            oxygen saturations were monitored continuously. The                            Colonoscope was introduced through the anus and                            advanced to the the  cecum, identified by                            appendiceal orifice and ileocecal valve. The                            ileocecal valve, appendiceal orifice, and rectum                            were photographed. The quality of the bowel                            preparation was adequate. The colonoscopy was                            performed without difficulty. The patient tolerated                            the procedure well. Scope In: 9:43:57 AM Scope Out: 9:57:01 AM Scope Withdrawal Time: 0 hours 9 minutes 40 seconds  Total Procedure Duration: 0 hours 13 minutes 4 seconds  Findings:                 The perianal and digital rectal examinations were                            normal.                           The entire examined colon appeared normal on direct                            and retroflexion views. Complications:            No immediate complications. Estimated blood loss:                            None. Estimated Blood Loss:     Estimated blood loss: none. Impression:               - The entire examined colon is normal on direct and  retroflexion views.                           - No specimens collected. Recommendation:           - Repeat colonoscopy in 5 years for surveillance.                           - Patient has a contact number available for                            emergencies. The signs and symptoms of potential                            delayed complications were discussed with the                            patient. Return to normal activities tomorrow.                            Written discharge instructions were provided to the                            patient.                           - Resume previous diet.                           - Continue present medications. Ladene Artist, MD 04/09/2018 10:01:18 AM This report has been signed electronically.

## 2018-04-09 NOTE — Patient Instructions (Signed)
Continue present medications. Repeat colonoscopy in 5 years for surveillance.    YOU HAD AN ENDOSCOPIC PROCEDURE TODAY AT [facility]:   Refer to the procedure report that was given to you for any specific questions about what was found during the examination.  If the procedure report does not answer your questions, please call your gastroenterologist to clarify.  If you requested that your care partner not be given the details of your procedure findings, then the procedure report has been included in a sealed envelope for you to review at your convenience later.  YOU SHOULD EXPECT: Some feelings of bloating in the abdomen. Passage of more gas than usual.  Walking can help get rid of the air that was put into your GI tract during the procedure and reduce the bloating. If you had a lower endoscopy (such as a colonoscopy or flexible sigmoidoscopy) you may notice spotting of blood in your stool or on the toilet paper. If you underwent a bowel prep for your procedure, you may not have a normal bowel movement for a few days.  Please Note:  You might notice some irritation and congestion in your nose or some drainage.  This is from the oxygen used during your procedure.  There is no need for concern and it should clear up in a day or so.  SYMPTOMS TO REPORT IMMEDIATELY:   Following lower endoscopy (colonoscopy or flexible sigmoidoscopy):  Excessive amounts of blood in the stool  Significant tenderness or worsening of abdominal pains  Swelling of the abdomen that is new, acute  Fever of 100F or higher    For urgent or emergent issues, a gastroenterologist can be reached at any hour by calling [phone].   DIET:  We do recommend a small meal at first, but then you may proceed to your regular diet.  Drink plenty of fluids but you should avoid alcoholic beverages for 24 hours.  ACTIVITY:  You should plan to take it easy for the rest of today and you should NOT DRIVE or use  heavy machinery until tomorrow (because of the sedation medicines used during the test).    FOLLOW UP: Our staff will call the number listed on your records the next business day following your procedure to check on you and address any questions or concerns that you may have regarding the information given to you following your procedure. If we do not reach you, we will leave a message.  However, if you are feeling well and you are not experiencing any problems, there is no need to return our call.  We will assume that you have returned to your regular daily activities without incident.  If any biopsies were taken you will be contacted by phone or by letter within the next 1-3 weeks.  Please call us at [phone] if you have not heard about the biopsies in 3 weeks.    SIGNATURES/CONFIDENTIALITY: You and/or your care partner have signed paperwork which will be entered into your electronic medical record.  These signatures attest to the fact that that the information above on your After Visit Summary has been reviewed and is understood.  Full responsibility of the confidentiality of this discharge information lies with you and/or your care-partner.

## 2018-04-09 NOTE — Progress Notes (Signed)
A and O x3. Report to RN. Tolerated MAC anesthesia well.

## 2018-04-10 ENCOUNTER — Telehealth: Payer: Self-pay

## 2018-04-10 NOTE — Telephone Encounter (Signed)
  Follow up Call-  Call back number 04/09/2018  Post procedure Call Back phone  # [phone]  Permission to leave phone message Yes  Some recent data might be hidden     Patient questions:  Do you have a fever, pain , or abdominal swelling? No. Pain Score  0 *  Have you tolerated food without any problems? Yes.    Have you been able to return to your normal activities? Yes.    Do you have any questions about your discharge instructions: Diet   No. Medications  No. Follow up visit  No.  Do you have questions or concerns about your Care? No.  Actions: * If pain score is 4 or above: No action needed, pain <4.

## 2018-04-11 DIAGNOSIS — M4722 Other spondylosis with radiculopathy, cervical region: Secondary | ICD-10-CM | POA: Diagnosis not present

## 2018-04-11 DIAGNOSIS — M79602 Pain in left arm: Secondary | ICD-10-CM | POA: Diagnosis not present

## 2018-04-13 DIAGNOSIS — M4722 Other spondylosis with radiculopathy, cervical region: Secondary | ICD-10-CM | POA: Diagnosis not present

## 2018-04-13 DIAGNOSIS — M79602 Pain in left arm: Secondary | ICD-10-CM | POA: Diagnosis not present

## 2018-04-16 DIAGNOSIS — M79602 Pain in left arm: Secondary | ICD-10-CM | POA: Diagnosis not present

## 2018-04-16 DIAGNOSIS — M4722 Other spondylosis with radiculopathy, cervical region: Secondary | ICD-10-CM | POA: Diagnosis not present

## 2018-04-18 DIAGNOSIS — M4722 Other spondylosis with radiculopathy, cervical region: Secondary | ICD-10-CM | POA: Diagnosis not present

## 2018-04-18 DIAGNOSIS — M79602 Pain in left arm: Secondary | ICD-10-CM | POA: Diagnosis not present

## 2018-04-19 ENCOUNTER — Telehealth: Payer: Self-pay | Admitting: Gastroenterology

## 2018-04-19 NOTE — Telephone Encounter (Signed)
Patient reports a few episodes of bloody diarrhea over the last few days.  She had a colonoscopy on 04/09/18.  She does not feel that the dicyclomine is not helping her symptoms.  She is having cramping and intermittent bloody diarrhea. Please advise

## 2018-04-19 NOTE — Telephone Encounter (Signed)
Patient notified and will come on 04/24/18 1:45 with Ellouise Newer, PA

## 2018-04-19 NOTE — Telephone Encounter (Signed)
Colonoscopy was normal APP appt soon to further evaluate

## 2018-04-23 DIAGNOSIS — M79602 Pain in left arm: Secondary | ICD-10-CM | POA: Diagnosis not present

## 2018-04-23 DIAGNOSIS — M4722 Other spondylosis with radiculopathy, cervical region: Secondary | ICD-10-CM | POA: Diagnosis not present

## 2018-04-24 ENCOUNTER — Ambulatory Visit (INDEPENDENT_AMBULATORY_CARE_PROVIDER_SITE_OTHER): Payer: 59 | Admitting: Physician Assistant

## 2018-04-24 ENCOUNTER — Other Ambulatory Visit (INDEPENDENT_AMBULATORY_CARE_PROVIDER_SITE_OTHER): Payer: 59

## 2018-04-24 ENCOUNTER — Encounter: Payer: Self-pay | Admitting: Physician Assistant

## 2018-04-24 VITALS — BP 126/80 | HR 100 | Ht 63.0 in | Wt 200.0 lb

## 2018-04-24 DIAGNOSIS — R197 Diarrhea, unspecified: Secondary | ICD-10-CM | POA: Diagnosis not present

## 2018-04-24 DIAGNOSIS — R1084 Generalized abdominal pain: Secondary | ICD-10-CM | POA: Diagnosis not present

## 2018-04-24 DIAGNOSIS — K921 Melena: Secondary | ICD-10-CM

## 2018-04-24 LAB — COMPREHENSIVE METABOLIC PANEL
ALBUMIN: 3.8 g/dL (ref 3.5–5.2)
ALK PHOS: 84 U/L (ref 39–117)
ALT: 13 U/L (ref 0–35)
AST: 20 U/L (ref 0–37)
BUN: 7 mg/dL (ref 6–23)
CHLORIDE: 103 meq/L (ref 96–112)
CO2: 31 mEq/L (ref 19–32)
Calcium: 9.3 mg/dL (ref 8.4–10.5)
Creatinine, Ser: 0.76 mg/dL (ref 0.40–1.20)
GFR: 103.37 mL/min (ref >60.00)
Glucose, Bld: 106 mg/dL — ABNORMAL HIGH (ref 70–99)
POTASSIUM: 3.7 meq/L (ref 3.5–5.1)
SODIUM: 141 meq/L (ref 135–145)
TOTAL PROTEIN: 7.5 g/dL (ref 6.0–8.3)
Total Bilirubin: 0.3 mg/dL (ref 0.2–1.2)

## 2018-04-24 LAB — CBC WITH DIFFERENTIAL/PLATELET
BASOS PCT: 0.5 % (ref 0.0–3.0)
Basophils Absolute: 0 10*3/uL (ref 0.0–0.1)
EOS PCT: 1.9 % (ref 0.0–5.0)
Eosinophils Absolute: 0.1 10*3/uL (ref 0.0–0.7)
HEMATOCRIT: 34.5 % — AB (ref 36.0–46.0)
Hemoglobin: 11.5 g/dL — ABNORMAL LOW (ref 12.0–15.0)
LYMPHS PCT: 23.6 % (ref 12.0–46.0)
Lymphs Abs: 1.8 10*3/uL (ref 0.7–4.0)
MCHC: 33.2 g/dL (ref 30.0–36.0)
MCV: 86.5 fl (ref 78.0–100.0)
MONO ABS: 0.7 10*3/uL (ref 0.1–1.0)
Monocytes Relative: 9 % (ref 3.0–12.0)
Neutro Abs: 4.9 10*3/uL (ref 1.4–7.7)
Neutrophils Relative %: 65 % (ref 43.0–77.0)
PLATELETS: 407 10*3/uL — AB (ref 150.0–400.0)
RBC: 3.99 Mil/uL (ref 3.87–5.11)
RDW: 13.6 % (ref 11.5–15.5)
WBC: 7.6 10*3/uL (ref 4.0–10.5)

## 2018-04-24 NOTE — Patient Instructions (Addendum)
If you are age 50 or older, your body mass index should be between 23-30. Your Body mass index is 35.43 kg/m. If this is out of the aforementioned range listed, please consider follow up with your Primary Care Provider.  If you are age 51 or younger, your body mass index should be between 19-25. Your Body mass index is 35.43 kg/m. If this is out of the aformentioned range listed, please consider follow up with your Primary Care Provider.   Your provider has requested that you go to the basement level for lab work before leaving today. Press "B" on the elevator. The lab is located at the first door on the left as you exit the elevator.  You have been scheduled for a CTA scan of the abdomen and pelvis at [facility] ([address]---this is in the same building as Press [facility]).   You are scheduled on 05/04/18 at 12 noon. You should arrive 15 minutes prior to your appointment time for registration. Please follow the written instructions below on the day of your exam:  WARNING: IF YOU ARE ALLERGIC TO IODINE/X-RAY DYE, PLEASE NOTIFY RADIOLOGY IMMEDIATELY AT [phone]! YOU WILL BE GIVEN A 13 HOUR PREMEDICATION PREP.  1) Do not eat anything after 8 am (4 hours prior to your test)  You may take any medications as prescribed with a small amount of water, if necessary. If you take any of the following medications: METFORMIN, GLUCOPHAGE, GLUCOVANCE, AVANDAMET, RIOMET, FORTAMET, ACTOPLUS MET, JANUMET, GLUMETZA or METAGLIP, you MAY be asked to HOLD this medication 48 hours AFTER the exam.  The contrast solution may cause some diarrhea. Depending on your individual set of symptoms, you may also receive an intravenous injection of x-ray contrast/dye. Plan on being at [facility] for 30 minutes or longer, depending on the type of exam you are having performed.  This test typically takes 30-45 minutes to complete.  If you have any questions regarding your exam or if you need to  reschedule, you may call the CT department at [phone] between the hours of 8:00 am and 5:00 pm, Monday-Friday.  ________________________________________________________________________  Thank you for choosing me and [facility].   Ellouise Newer, PA-C

## 2018-04-24 NOTE — Progress Notes (Signed)
Reviewed and agree with initial management plan.  Natallie Ravenscroft T. Kalesha Irving, MD FACG 

## 2018-04-24 NOTE — Progress Notes (Signed)
Chief Complaint: Bloody diarrhea  HPI:    [name] is a 52 year old female with a past medical history as listed below, known to [name], who presents to clinic today with a complaint of bloody diarrhea.    04/09/2018 colonoscopy [name], the entire examined colon was normal, no hemorrhoids.  Repeat recommended in 5 years for surveillance due to her personal history of adenomatous polyps on her last colonoscopy more than 5 years ago.    11/24/2017 patient seen by Cindy Savoy, NP.  Her history of IBS was discussed and she took Bentyl 3 times daily on a regular basis, every couple of years had episodes of crampy diarrhea that lasted all day.  This is also associated painless rectal bleeding occasionally.  Time was given a trial of Levsin as well as scheduled for colonoscopy as well.    Today, patient expresses that she has had another episode of abdominal cramping with diarrhea and some bloody stool.  Explains that she will have 2-3 of these episodes a year, the last time this occurred was prior to her appointment in May with [name].  Explains that when these start she has severe abdominal cramping, typically following a meal (thought she can not identify certain foods which set her off), which is typically in her lower abdomen and will spread across her abdomen, she will start sweating and be "stuck in the bathroom".  At first, she is constipated with a lot of straining and cannot get anything out and then typically in the middle the night will wake up with loose stool that "just flows out", this continues for about 4-5 hours and she will put heat on her stomach for her cramps.  The next day she will see a pinkish tinge on the toilet paper and in the toilet with her stool and this will be gone within 24 hours.  She then feels drained for 3 to 4 days and stays on a very bland diet.  Currently, she is just recovering from her last episode.  Explains that when she was first seen in our clinic a few  years ago and was started on Bentyl which does help to decrease the frequency of these episodes but not stop them.  She did try Levsin on the second day which did help with her abdominal cramping but forgot to take this on the day at most severe symptoms.    Social history is positive for getting engaged over the weekend.    Denies fever, chills, weight loss, anorexia, nausea, vomiting, heartburn or reflux.  Past Medical History:  Diagnosis Date  . Anemia   . Arthritis   . GERD (gastroesophageal reflux disease)   . Irritable bowel syndrome   . Overweight(278.02)   . Personal history of colonic polyps 09/19/2011   tubular adenoma  . Segmental colitis [facility])     Past Surgical History:  Procedure Laterality Date  . COLONOSCOPY    . FACET JOINT INJECTION  03/28/11   injections in neck  . hysterectomy  2000  . KNEE SURGERY Right     Right knee arthroscopic partial lateral meniscectomy (01/22/07),arthroscopic partial lateral meniscectomy 5/09  . POLYPECTOMY    . SPINE SURGERY  10/13/06   Posterior interbody fusion (PLIF) L5-S1 (07/04/07)  . WRIST FRACTURE SURGERY Left 2011   Due to injury at work in 2008.    Current Outpatient Medications  Medication Sig Dispense Refill  . Calcium Carbonate-Vitamin D (CALTRATE 600+D) 600-400 MG-UNIT per tablet Take 1  tablet by mouth 2 (two) times daily.      Marland Kitchen dicyclomine (BENTYL) 20 MG tablet TAKE 1 TABLET BY MOUTH THREE TIMES DAILY 90 tablet 11  . hyoscyamine (LEVSIN SL) 0.125 MG SL tablet Place 1 tablet (0.125 mg total) under the tongue every 4 (four) hours as needed. 15 tablet 0  . methocarbamol (ROBAXIN) 500 MG tablet Take 500 mg by mouth 2 (two) times daily.     . pantoprazole (PROTONIX) 40 MG tablet TAKE 1 TABLET BY MOUTH TWICE DAILY 60 tablet 11  . traMADol (ULTRAM) 50 MG tablet 1-2 tablets by mouth every 6 hours as needed     No current facility-administered medications for this visit.     Allergies as of 04/24/2018  . (No Known Allergies)     Family History  Problem Relation Age of Onset  . Asthma Mother   . Cancer Mother        gastric tumor  . Glaucoma Mother   . Hyperlipidemia Mother   . COPD Mother   . Stomach cancer Mother   . Esophageal cancer Father        mets to lung and liver  . Hypertension Maternal Aunt   . Colon cancer Neg Hx   . Colon polyps Neg Hx   . Rectal cancer Neg Hx     Social History   Socioeconomic History  . Marital status: Married    Spouse name: Not on file  . Number of children: 2  . Years of education: Not on file  . Highest education level: Not on file  Occupational History  . Occupation: Disabled  Social Needs  . Financial resource strain: Not on file  . Food insecurity:    Worry: Not on file    Inability: Not on file  . Transportation needs:    Medical: Not on file    Non-medical: Not on file  Tobacco Use  . Smoking status: Never Smoker  . Smokeless tobacco: Never Used  Substance and Sexual Activity  . Alcohol use: No    Alcohol/week: 0.0 standard drinks  . Drug use: No  . Sexual activity: Not on file  Lifestyle  . Physical activity:    Days per week: Not on file    Minutes per session: Not on file  . Stress: Not on file  Relationships  . Social connections:    Talks on phone: Not on file    Gets together: Not on file    Attends religious service: Not on file    Active member of club or organization: Not on file    Attends meetings of clubs or organizations: Not on file    Relationship status: Not on file  . Intimate partner violence:    Fear of current or ex partner: Not on file    Emotionally abused: Not on file    Physically abused: Not on file    Forced sexual activity: Not on file  Other Topics Concern  . Not on file  Social History Narrative   Regular exercise:  No   Caffeine Use:  Occasional sweet tea   She is legally separated   2 children ages 50 year old son and 29 year old daughter.  Both children.   Previously worked in [employer] research (medical), now  disabled.   Completed 2 yrs of college.      Review of Systems:    Constitutional: No weight loss, fever or chills Cardiovascular: No chest pain Respiratory: No SOB  Gastrointestinal: See HPI  and otherwise negative   Physical Exam:  Vital signs: BP 126/80   Pulse 100   Ht 5\' 3"  (1.6 m)   Wt 200 lb (90.7 kg)   LMP 02/25/2009   BMI 35.43 kg/m   Constitutional:   Pleasant overweight, AA, female appears to be in NAD, Well developed, Well nourished, alert and cooperative Respiratory: Respirations even and unlabored. Lungs clear to auscultation bilaterally.   No wheezes, crackles, or rhonchi.  Cardiovascular: Normal S1, S2. No MRG. Regular rate and rhythm. No peripheral edema, cyanosis or pallor.  Gastrointestinal:  Soft, nondistended, mild generalized ttp. No rebound or guarding. Normal bowel sounds. No appreciable masses or hepatomegaly. Psychiatric: Demonstrates good judgement and reason without abnormal affect or behaviors.  No recent labs or imaging.  Assessment: 1.  Generalized abdominal cramping: Comes in episodes, typically after eating something, decreased in frequency since starting Bentyl, severe episodes which will last for 24 hours starts with cramping and constipation, then with diarrhea and then hematochezia all within a 48-hour time period, remains exhausted and fatigued for at least 3 to 4 days afterwards, recent colonoscopy earlier this month was normal; consider mesenteric ischemia versus ischemic colitis versus IBS 2.  Diarrhea: With episodes above 3.  Hematochezia: After episodes above, resolves within 12 to 24 hours; likely hemorrhoids  Pitts: 1.  Ordered CBC and CMP 2.  Ordered CTA for further evaluation of possible mesenteric ischemia 3.  Continue Bentyl 3 times daily 4.  Would recommend she use Levsin as soon as she feels an episode coming on 5.  Patient to follow in clinic with [name] or myself as recommended after labs and imaging above.  Ellouise Newer,  PA-C [facility] 04/24/2018, 1:38 PM  Cc: Aretta Nip, MD

## 2018-04-25 ENCOUNTER — Other Ambulatory Visit: Payer: Self-pay | Admitting: Nurse Practitioner

## 2018-04-25 DIAGNOSIS — M79602 Pain in left arm: Secondary | ICD-10-CM | POA: Diagnosis not present

## 2018-04-25 DIAGNOSIS — M4722 Other spondylosis with radiculopathy, cervical region: Secondary | ICD-10-CM | POA: Diagnosis not present

## 2018-04-27 DIAGNOSIS — M4722 Other spondylosis with radiculopathy, cervical region: Secondary | ICD-10-CM | POA: Diagnosis not present

## 2018-05-04 ENCOUNTER — Ambulatory Visit (INDEPENDENT_AMBULATORY_CARE_PROVIDER_SITE_OTHER)
Admission: RE | Admit: 2018-05-04 | Discharge: 2018-05-04 | Disposition: A | Discharge status: Home / Self Care | Payer: 59 | Source: Ambulatory Visit | Attending: Physician Assistant | Admitting: Physician Assistant

## 2018-05-04 DIAGNOSIS — R197 Diarrhea, unspecified: Secondary | ICD-10-CM

## 2018-05-04 DIAGNOSIS — K921 Melena: Secondary | ICD-10-CM

## 2018-05-04 DIAGNOSIS — K769 Liver disease, unspecified: Secondary | ICD-10-CM | POA: Diagnosis not present

## 2018-05-04 DIAGNOSIS — R1084 Generalized abdominal pain: Secondary | ICD-10-CM | POA: Diagnosis not present

## 2018-05-04 MED ORDER — IOPAMIDOL (ISOVUE-370) INJECTION 76%
100.0000 mL | Freq: Once | INTRAVENOUS | Status: AC | PRN
  Administered 2018-05-04: 100 mL via INTRAVENOUS

## 2018-05-09 ENCOUNTER — Other Ambulatory Visit: Payer: Self-pay

## 2018-05-09 DIAGNOSIS — K769 Liver disease, unspecified: Secondary | ICD-10-CM

## 2018-05-16 ENCOUNTER — Ambulatory Visit (HOSPITAL_COMMUNITY)
Admission: RE | Admit: 2018-05-16 | Discharge: 2018-05-16 | Disposition: A | Discharge status: Home / Self Care | Payer: 59 | Source: Ambulatory Visit | Attending: Gastroenterology | Admitting: Gastroenterology

## 2018-05-16 DIAGNOSIS — K769 Liver disease, unspecified: Secondary | ICD-10-CM

## 2018-05-16 DIAGNOSIS — R160 Hepatomegaly, not elsewhere classified: Secondary | ICD-10-CM | POA: Diagnosis not present

## 2018-05-16 MED ORDER — GADOBUTROL 1 MMOL/ML IV SOLN
9.0000 mL | Freq: Once | INTRAVENOUS | Status: AC | PRN
  Administered 2018-05-16: 9 mL via INTRAVENOUS

## 2018-05-17 ENCOUNTER — Telehealth: Payer: Self-pay

## 2018-05-17 NOTE — Telephone Encounter (Signed)
Notes recorded by Ladene Artist, MD on 05/17/2018 at 4:31 PM EST MR confirms CT findings following office evaluation with Ellouise Newer, PAC. Please inform the patient and recommend then schedule US guided or CT guided liver biopsy of one of the liver lesion

## 2018-05-18 ENCOUNTER — Other Ambulatory Visit: Payer: Self-pay

## 2018-05-18 ENCOUNTER — Telehealth: Payer: Self-pay | Admitting: Gastroenterology

## 2018-05-18 DIAGNOSIS — K769 Liver disease, unspecified: Secondary | ICD-10-CM

## 2018-05-18 NOTE — Progress Notes (Signed)
US biopsy

## 2018-05-18 NOTE — Telephone Encounter (Signed)
The pt was advised and liver biopsy has been requested.  That office will contact the pt directly to set up.  The pt will call if she has not heard from that office in a week.

## 2018-05-18 NOTE — Telephone Encounter (Signed)
Routed to Patty. 

## 2018-05-18 NOTE — Telephone Encounter (Signed)
See alternate result note. Pt referral for liver biopsy has been sent to radiology

## 2018-05-22 ENCOUNTER — Other Ambulatory Visit: Payer: Self-pay | Admitting: Nurse Practitioner

## 2018-05-29 ENCOUNTER — Other Ambulatory Visit: Payer: Self-pay | Admitting: Physician Assistant

## 2018-05-29 ENCOUNTER — Other Ambulatory Visit: Payer: Self-pay | Admitting: Radiology

## 2018-05-29 ENCOUNTER — Other Ambulatory Visit: Payer: Self-pay | Admitting: Student

## 2018-05-30 ENCOUNTER — Encounter (HOSPITAL_COMMUNITY): Payer: Self-pay

## 2018-05-30 ENCOUNTER — Ambulatory Visit (HOSPITAL_COMMUNITY)
Admission: RE | Admit: 2018-05-30 | Discharge: 2018-05-30 | Disposition: A | Discharge status: Home / Self Care | Payer: 59 | Source: Ambulatory Visit | Attending: Gastroenterology | Admitting: Gastroenterology

## 2018-05-30 DIAGNOSIS — K219 Gastro-esophageal reflux disease without esophagitis: Secondary | ICD-10-CM | POA: Insufficient documentation

## 2018-05-30 DIAGNOSIS — Z9889 Other specified postprocedural states: Secondary | ICD-10-CM | POA: Insufficient documentation

## 2018-05-30 DIAGNOSIS — Z79899 Other long term (current) drug therapy: Secondary | ICD-10-CM | POA: Insufficient documentation

## 2018-05-30 DIAGNOSIS — Z90710 Acquired absence of both cervix and uterus: Secondary | ICD-10-CM | POA: Insufficient documentation

## 2018-05-30 DIAGNOSIS — K769 Liver disease, unspecified: Secondary | ICD-10-CM | POA: Insufficient documentation

## 2018-05-30 LAB — CBC
HCT: 36.4 % (ref 36.0–46.0)
Hemoglobin: 11.2 g/dL — ABNORMAL LOW (ref 12.0–15.0)
MCH: 27.5 pg (ref 26.0–34.0)
MCHC: 30.8 g/dL (ref 30.0–36.0)
MCV: 89.2 fL (ref 80.0–100.0)
Platelets: 343 10*3/uL (ref 150–400)
RBC: 4.08 MIL/uL (ref 3.87–5.11)
RDW: 13.5 % (ref 11.5–15.5)
WBC: 10.4 10*3/uL (ref 4.0–10.5)
nRBC: 0 % (ref 0.0–0.2)

## 2018-05-30 LAB — PROTIME-INR
INR: 1.07
PROTHROMBIN TIME: 13.8 s (ref 11.4–15.2)

## 2018-05-30 LAB — APTT: APTT: 31 s (ref 24–36)

## 2018-05-30 MED ORDER — MIDAZOLAM HCL 2 MG/2ML IJ SOLN
INTRAMUSCULAR | Status: AC
  Filled 2018-05-30: qty 2

## 2018-05-30 MED ORDER — SODIUM CHLORIDE 0.9 % IV SOLN: INTRAVENOUS | Status: DC

## 2018-05-30 MED ORDER — GELATIN ABSORBABLE 12-7 MM EX MISC
CUTANEOUS | Status: AC
  Filled 2018-05-30: qty 1

## 2018-05-30 MED ORDER — FENTANYL CITRATE (PF) 100 MCG/2ML IJ SOLN
INTRAMUSCULAR | Status: AC
  Filled 2018-05-30: qty 2

## 2018-05-30 MED ORDER — FENTANYL CITRATE (PF) 100 MCG/2ML IJ SOLN
INTRAMUSCULAR | Status: AC | PRN
  Administered 2018-05-30: 25 ug via INTRAVENOUS
  Administered 2018-05-30 (×2): 50 ug via INTRAVENOUS
  Administered 2018-05-30: 25 ug via INTRAVENOUS

## 2018-05-30 MED ORDER — MIDAZOLAM HCL 2 MG/2ML IJ SOLN
INTRAMUSCULAR | Status: AC | PRN
  Administered 2018-05-30: 1 mg via INTRAVENOUS
  Administered 2018-05-30 (×2): 0.5 mg via INTRAVENOUS

## 2018-05-30 MED ORDER — LIDOCAINE HCL (PF) 1 % IJ SOLN
INTRAMUSCULAR | Status: AC
  Filled 2018-05-30: qty 30

## 2018-05-30 MED ORDER — LIDOCAINE-EPINEPHRINE 1 %-1:100000 IJ SOLN
INTRAMUSCULAR | Status: AC
  Filled 2018-05-30: qty 1

## 2018-05-30 MED ORDER — OXYCODONE-ACETAMINOPHEN 5-325 MG PO TABS
1.0000 | ORAL_TABLET | Freq: Once | ORAL | Status: AC
  Administered 2018-05-30: 2 via ORAL
  Filled 2018-05-30: qty 2

## 2018-05-30 NOTE — Procedures (Signed)
Pre Procedure Dx: Liver lesions Post Procedural Dx: Same  Technically successful US guided biopsy of indeterminate lesion within the caudal aspect of the right lobe of the liver.  EBL: None No immediate complications.   Jay Jene Huq, MD Pager #: [phone]    

## 2018-05-30 NOTE — Progress Notes (Signed)
PA notified of c/o pain and she notified Dr and order noted

## 2018-05-30 NOTE — H&P (Signed)
Chief Complaint: Patient was seen in consultation today for liver lesion biopsy  Referring Physician(s): Stark,Malcolm T  Supervising Physician: Sandi Mariscal  Patient Status: [facility] - Out-pt  History of Present Illness: [name] is a 52 y.o. female with a past medical history significant for anemia, GERD, segmental colitis, IBS, history of colonic polyps and spinal fusion surgery who is followed by [facility] for intermittent abdominal cramping with bloody diarrhea. She reports that every few months she experiences severe abdominal cramping followed by several hours of diarrhea with pink tinge on the toilet paper following these episodes. Due to these symptoms she underwent CTA abdomen/pelvis on 05/04/18 for possible mesenteric ischemia which showed multiple hypoenhancing/hypoattenuating livers lesions that are concerning for liver metastases. Follow up MR liver was performed on 05/16/18 which again showed numerous liver masses with targetoid enhancement scattered throughout the liver suggestive of metastatic disease, no primary neoplasm was evident. Request has been made to IR for image guided biopsy of these lesions.  Patient present with friend today, reports she has been feeling good overall. She has not experienced any abdominal cramping/diarrhea for several months. She states she was very surprised to learn about the lesions on her liver, but is thankful that it was noticed so she could find out what is causing them. She is aware of the procedure today and ready to proceed.   Past Medical History:  Diagnosis Date  . Anemia   . Arthritis   . GERD (gastroesophageal reflux disease)   . Irritable bowel syndrome   . Overweight(278.02)   . Personal history of colonic polyps 09/19/2011   tubular adenoma  . Segmental colitis [facility])     Past Surgical History:  Procedure Laterality Date  . COLONOSCOPY    . FACET JOINT INJECTION  03/28/11   injections in neck  . hysterectomy  2000  .  KNEE SURGERY Right     Right knee arthroscopic partial lateral meniscectomy (01/22/07),arthroscopic partial lateral meniscectomy 5/09  . POLYPECTOMY    . SPINE SURGERY  10/13/06   Posterior interbody fusion (PLIF) L5-S1 (07/04/07)  . WRIST FRACTURE SURGERY Left 2011   Due to injury at work in 2008.    Allergies: Patient has no known allergies.  Medications: Prior to Admission medications   Medication Sig Start Date End Date Taking? Authorizing Provider  dicyclomine (BENTYL) 20 MG tablet TAKE 1 TABLET BY MOUTH THREE TIMES DAILY Patient taking differently: Take 20 mg by mouth 3 (three) times daily.  01/18/18  Yes Ladene Artist, MD  hyoscyamine (LEVSIN SL) 0.125 MG SL tablet DISSOLVE 1 TABLET UNDER THE TONGUE EVERY 4 HOURS AS NEEDED Patient taking differently: Take 0.125 mg by mouth every 4 (four) hours as needed for cramping.  05/22/18  Yes Willia Craze, NP  methocarbamol (ROBAXIN) 750 MG tablet Take 750 mg by mouth 2 (two) times daily as needed for muscle spasms.   Yes [provider]  pantoprazole (PROTONIX) 40 MG tablet TAKE 1 TABLET BY MOUTH TWICE DAILY 12/01/17  Yes Ladene Artist, MD  traMADol (ULTRAM) 50 MG tablet Take 50 mg by mouth 3 (three) times daily as needed for moderate pain.    Yes [provider]  omeprazole (PRILOSEC) 20 MG capsule Take 20 mg by mouth daily.  09/08/11  [provider]     Family History  Problem Relation Age of Onset  . Asthma Mother   . Cancer Mother        gastric tumor  .  Glaucoma Mother   . Hyperlipidemia Mother   . COPD Mother   . Stomach cancer Mother   . Esophageal cancer Father        mets to lung and liver  . Hypertension Maternal Aunt   . Colon cancer Neg Hx   . Colon polyps Neg Hx   . Rectal cancer Neg Hx     Social History   Socioeconomic History  . Marital status: Married    Spouse name: Not on file  . Number of children: 2  . Years of education: Not on file  . Highest education level: Not on  file  Occupational History  . Occupation: Disabled  Social Needs  . Financial resource strain: Not on file  . Food insecurity:    Worry: Not on file    Inability: Not on file  . Transportation needs:    Medical: Not on file    Non-medical: Not on file  Tobacco Use  . Smoking status: Never Smoker  . Smokeless tobacco: Never Used  Substance and Sexual Activity  . Alcohol use: No    Alcohol/week: 0.0 standard drinks  . Drug use: No  . Sexual activity: Not on file  Lifestyle  . Physical activity:    Days per week: Not on file    Minutes per session: Not on file  . Stress: Not on file  Relationships  . Social connections:    Talks on phone: Not on file    Gets together: Not on file    Attends religious service: Not on file    Active member of club or organization: Not on file    Attends meetings of clubs or organizations: Not on file    Relationship status: Not on file  Other Topics Concern  . Not on file  Social History Narrative   Regular exercise:  No   Caffeine Use:  Occasional sweet tea   She is legally separated   2 children ages 1 year old son and 46 year old daughter.  Both children.   Previously worked in [employer], research (medical), now disabled.   Completed 2 yrs of college.       Review of Systems: A 12 point ROS discussed and pertinent positives are indicated in the HPI above.  All other systems are negative.  Review of Systems  Constitutional: Negative for appetite change, chills, fever and unexpected weight change.  Respiratory: Negative for cough and shortness of breath.   Cardiovascular: Negative for chest pain.  Gastrointestinal: Negative for abdominal pain, constipation, diarrhea, nausea and vomiting.  Musculoskeletal: Positive for back pain (intermittent, history of back surgery - takes tramadol) and neck pain (intremittent).  Neurological: Negative for dizziness and syncope.  Psychiatric/Behavioral: Negative for confusion.    Vital Signs: BP 133/87   Pulse 87    Temp 98.6 F (37 C) (Oral)   Resp 16   Ht 5\' 3"  (1.6 m)   Wt 190 lb (86.2 kg)   LMP 02/25/2009   SpO2 98%   BMI 33.66 kg/m   Physical Exam  Constitutional: She is oriented to person, place, and time. No distress.  HENT:  Head: Normocephalic.  Cardiovascular: Normal rate, regular rhythm and normal heart sounds.  Pulmonary/Chest: Effort normal and breath sounds normal.  Abdominal: Soft. Bowel sounds are normal. She exhibits no distension. There is no tenderness.  Neurological: She is alert and oriented to person, place, and time.  Skin: Skin is warm and dry. She is not diaphoretic.  Psychiatric: She has a  normal mood and affect. Her behavior is normal. Judgment and thought content normal.  Vitals reviewed.    MD Evaluation Airway: WNL Heart: WNL Abdomen: WNL Chest/ Lungs: WNL ASA  Classification: 3 Mallampati/Airway Score: One   Imaging: Mr Liver W Wo Contrast  Result Date: 05/16/2018 CLINICAL DATA:  Chronic abdominal pain and diarrhea. Multiple indeterminate liver masses on recent CT. EXAM: MRI ABDOMEN WITHOUT AND WITH CONTRAST TECHNIQUE: Multiplanar multisequence MR imaging of the abdomen was performed both before and after the administration of intravenous contrast. CONTRAST:  9 cc Gadavist IV. COMPARISON:  05/04/2018 CT abdomen/pelvis. FINDINGS: Lower chest: No acute abnormality at the lung bases. Hepatobiliary: Normal liver size and configuration. No hepatic steatosis. There are numerous (at least 10) similar liver masses scattered throughout the liver, each demonstrating T1 and T2 signal intensity equivalent to the spleen (mildly T2 hyperintense and T1 hypointense) and targetoid enhancement, most suggestive of liver metastases. Representative 1.9 x 1.4 cm posterior segment 2 left liver lobe mass (series 904/image 40), 1.7 x 1.3 cm segment 7 right liver lobe mass (series 904/image 35) and 1.9 x 1.7 cm segment 6 right liver lobe mass (series 904/image 51). Normal gallbladder  with no cholelithiasis. No biliary ductal dilatation. Common bile duct diameter 3 mm. No choledocholithiasis. Pancreas: No pancreatic mass or duct dilation.  No pancreas divisum. Spleen: Normal size spleen. Punctate low-attenuation foci throughout the spleen are compatible with calcified granulomas as seen on recent CT. No splenic mass. Adrenals/Urinary Tract: Normal adrenals. No hydronephrosis. Normal kidneys with no renal mass. Stomach/Bowel: Normal non-distended stomach. Visualized small and large bowel is normal caliber, with no bowel wall thickening. Moderate to large colonic stool volume. Vascular/Lymphatic: Normal caliber abdominal aorta. Patent portal, splenic, hepatic and renal veins. No pathologically enlarged lymph nodes in the abdomen. Other: No abdominal ascites or focal fluid collection. Musculoskeletal: No aggressive appearing focal osseous lesions. Partially visualized posterior spinal fusion hardware in the lower lumbar spine. IMPRESSION: Numerous (at least 10) similar liver masses with targetoid enhancement scattered throughout the liver with MRI features most suggestive of metastatic disease. A primary neoplasm is not evident on this scan. Electronically Signed   By: Ilona Sorrel M.D.   On: 05/16/2018 09:46   Ct Angio Abd/pel W/ And/or W/o  Result Date: 05/04/2018 CLINICAL DATA:  52 year old female with a history of generalized abdominal cramping for 2 years. Diarrhea EXAM: CTA ABDOMEN AND PELVIS wITHOUT AND WITH CONTRAST TECHNIQUE: Multidetector CT imaging of the abdomen and pelvis was performed using the standard protocol during bolus administration of intravenous contrast. Multiplanar reconstructed images and MIPs were obtained and reviewed to evaluate the vascular anatomy. CONTRAST:  146mL ISOVUE-370 IOPAMIDOL (ISOVUE-370) INJECTION 76% COMPARISON:  CT 11/15/2011, ultrasound 08/20/2015 FINDINGS: VASCULAR Aorta: Unremarkable course, caliber, contour of the abdominal aorta. No dissection,  aneurysm, or periaortic fluid. Celiac: Unremarkable appearance of the celiac artery. SMA: No significant atherosclerotic changes of the superior mesenteric artery. Renals: Renal arteries are patent. Single left and single right renal artery. IMA: IMA patent Right lower extremity: Unremarkable course, caliber, and contour of the right iliac system. No aneurysm, dissection, or occlusion. Hypogastric artery is patent. Anterior and posterior division patent. Common femoral artery patent. Proximal SFA and profunda femoris patent. Left lower extremity: Unremarkable course, caliber, and contour of the left iliac system. No aneurysm, dissection, or occlusion. Hypogastric artery is patent. Anterior and posterior division patent. Common femoral artery patent. Proximal SFA and profunda femoris patent. Veins: Unremarkable appearance of the venous system. Incidental note made of  a retroaortic left renal vein Review of the MIP images confirms the above findings. NON-VASCULAR Lower chest: No acute. Hepatobiliary: There are multiple low-density/hypoenhancing liver lesions within right and left liver lobe, which are more pronounced and more numerous than the comparison CT of 2013. While there are features of hyperenhancement on the early arterial phase, these do not enhance in the typical dynamic pattern of a hemangioma which would be peripheral discontinuous nodularity with centripetal filling. Some of the lesions demonstrate the suggestion of hypervascularity/hyperenhancement at the margin, such as the posterior segment 6 lesion on image 22 of series 4. Nearly all of these persist in a hypodense/hypoenhancing feature on the late phase imaging. Interval growth of at least the segment 4 B lesion on image 22 of series 4. Pancreas: Unremarkable pancreas Spleen: Redemonstration of innumerable small focal calcifications throughout the splenic parenchyma. Adrenals/Urinary Tract: Unremarkable adrenal glands. Right: No hydronephrosis.  Symmetric perfusion to the left. No nephrolithiasis. Unremarkable course of the right ureter. Left: No hydronephrosis. Symmetric perfusion to the right. No nephrolithiasis. Unremarkable course of the left ureter. Unremarkable appearance of the urinary bladder . Stomach/Bowel: Unremarkable appearance of the stomach. Unremarkable appearance of small bowel. No evidence of obstruction. No colonic diverticula. Normal appendix. Lymphatic: No lymphadenopathy. No hyperenhancing lesions within the mesentery. No free fluid. No reactive changes. No edema/anasarca. Mesenteric: No free fluid or air. No adenopathy. Reproductive: Hysterectomy.  Unremarkable appearance of the adnexa. Other: No hernia. Musculoskeletal: Surgical changes of the L4-S1 level. No bony canal narrowing. Degenerative changes of the spine. No acute displaced fracture. IMPRESSION: No acute CT finding. There are multiple hypoenhancing/hypoattenuating liver lesions, which have increased in number and size since the prior CT. The overall pattern is concerning for liver metastases, potentially neuroendocrine malignancy given the patient's history of diarrhea and the enhancement dynamics. Further evaluation with contrast-enhanced MRI is recommended, as well as referral for oncologic evaluation. Redemonstration of multiple splenic granulomas, most likely representing prior granulomatous infection. Electronically Signed   By: Corrie Mckusick D.O.   On: 05/04/2018 16:32    Labs:  CBC: Recent Labs    04/24/18 1423 05/30/18 0630  WBC 7.6 10.4  HGB 11.5* 11.2*  HCT 34.5* 36.4  PLT 407.0* 343    COAGS: No results for input(s): INR, APTT in the last 8760 hours.  BMP: Recent Labs    04/24/18 1423  NA 141  K 3.7  CL 103  CO2 31  GLUCOSE 106*  BUN 7  CALCIUM 9.3  CREATININE 0.76    LIVER FUNCTION TESTS: Recent Labs    04/24/18 1423  BILITOT 0.3  AST 20  ALT 13  ALKPHOS 84  PROT 7.5  ALBUMIN 3.8    TUMOR MARKERS: No results for  input(s): AFPTM, CEA, CA199, CHROMGRNA in the last 8760 hours.  Assessment and Plan:  Patient with several year history of intermittent abdominal cramping and bloody diarrhea followed by [facility]. She underwent CTA abdomen/pelvis on 05/04/18 due to concern for mesenteric ischemia which was not noted, however several liver lesions were seen. MR liver was performed on 05/16/18 which again showed numerous liver lesions concerning for metastatic disease with no primary neoplasm identified. She presents to IR today for liver lesion biopsy.  She has been NPO since last evening, she does not take blood thinning medications, she took one tramadol 50 mg tablet at 4 am today. WBC 10.4, hgb 11.2, INR pending at the time of this note writing.  Risks and benefits discussed with the patient including, but  not limited to bleeding, infection, damage to adjacent structures or low yield requiring additional tests.  All of the patient's questions were answered, patient is agreeable to proceed.  Consent signed and in chart.  Thank you for this interesting consult.  I greatly enjoyed meeting [name] and look forward to participating in their care.  A copy of this report was sent to the requesting provider on this date.  Electronically Signed: Joaquim Nam, PA-C 05/30/2018, 7:20 AM   I spent a total of  30 Minutes   in face to face in clinical consultation, greater than 50% of which was counseling/coordinating care for liver lesion biopsy.

## 2018-05-30 NOTE — Discharge Instructions (Signed)
Liver Biopsy °The liver is a large organ in the upper right-hand side of your abdomen. A liver biopsy is a procedure in which a tissue sample is taken from the liver and examined under a microscope. The procedure is done to confirm a suspected problem. °There are three types of liver biopsies: °· Percutaneous. In this type, an incision is made in your abdomen. The sample is removed through the incision with a needle. °· Laparoscopic. In this type, several incisions are made in the abdomen. A tiny camera is passed through one of the incisions to help guide the health care provider. The sample is removed through the other incision or incisions. °· Transjugular. In this type, an incision is made in the neck. A tube is passed through the incision to the liver. The sample is removed through the tube with a needle. ° °Tell a health care provider about: °· Any allergies you have. °· All medicines you are taking, including vitamins, herbs, eye drops, creams, and over-the-counter medicines. °· Any problems you or family members have had with anesthetic medicines. °· Any blood disorders you have. °· Any surgeries you have had. °· Any medical conditions you have. °· Possibility of pregnancy, if this applies. °What are the risks? °Generally, this is a safe procedure. However, problems can occur and include: °· Bleeding. °· Infection. °· Bruising. °· Collapsed lung. °· Leak of digestive juices (bile) from the liver or gallbladder. °· Problems with heart rhythm. °· Pain at the biopsy site or in the right shoulder. °· Low blood pressure (hypotension). °· Injury to nearby organs or tissues. ° °What happens before the procedure? °· Your health care provider may do some blood or urine tests. These will help your health care provider learn how well your kidneys and liver are working and how well your blood clots. °· Ask your health care provider if you will be able to go home the day of the procedure. Arrange for someone to take you  home and stay with you for at least 24 hours. °· Do not eat or drink anything after midnight on the night before the procedure or as directed by your health care provider. °· Ask your health care provider about: °? Changing or stopping your regular medicines. This is especially important if you are taking diabetes medicines or blood thinners. °? Taking medicines such as aspirin and ibuprofen. These medicines can thin your blood. Do not take these medicines before your procedure if your health care provider asks you not to. °What happens during the procedure? °Regardless of the type of biopsy that will be done, you will have an IV line placed. Through this line, you will receive fluids and medicine to relax you. If you will be having a laparoscopic biopsy, you may also receive medicine through this line to make you sleep during the procedure (general anesthetic). °Percutaneous Liver Biopsy °· You will positioned on your back, with your right hand over your head. °· A health care provider will locate your liver by tapping and pressing on the right side of your abdomen or with the help of an ultrasound machine or CT scan. °· An area at the bottom of your last right rib will be numbed. °· An incision will be made in the numbed area. °· The biopsy needle will be inserted into the incision. °· Several samples of liver tissue will be taken with the biopsy needle. You will be asked to hold your breath as each sample is taken. °Laparoscopic Liver   Biopsy °· You will be positioned on your back. °· Several small incisions will be made in your abdomen. °· Your doctor will pass a tiny camera through one incision. The camera will allow the liver to be viewed on a TV monitor in the operating room. °· Tools will be passed through the other incision or incisions. These tools will be used to remove samples of liver tissue. °Transjugular Liver Biopsy °· You will be positioned on your back on an X-ray table, with your head turned to  your left. °· An area on your neck just over your jugular vein will be numbed. °· An incision will be made in the numbed area. °· A tiny tube will be inserted through the incision. It will be pushed through the jugular vein to a blood vessel in the liver called the hepatic vein. °· Dye will be inserted through the tube, and X-rays will be taken. The dye will make the blood vessels in the liver light up on the X-rays. °· The biopsy needle will be pushed through the tube until it reaches the liver. °· Samples of liver tissue will be taken with the biopsy needle. °· The needle and the tube will be removed. °After the samples are obtained, the incision or incisions will be closed. °What happens after the procedure? °· You will be taken to a recovery area. °· You may have to lie on your right side for 1-2 hours. This will prevent bleeding from the biopsy site. °· Your progress will be watched. Your blood pressure, pulse, and the biopsy site will be checked often. °· You may have some pain or feel sick. If this happens, tell your health care provider. °· As you begin to feel better, you will be offered ice and beverages. °· You may be allowed to go home when the medicines have worn off and you can walk, drink, eat, and use the bathroom. °This information is not intended to replace advice given to you by your health care provider. Make sure you discuss any questions you have with your health care provider. °Document Released: 09/03/2003 Document Revised: 11/16/2015 Document Reviewed: 08/09/2013 °Elsevier Interactive Patient Education © 2018 [employer] °Moderate Conscious Sedation, Adult, Care After °These instructions provide you with information about caring for yourself after your procedure. Your health care provider may also give you more specific instructions. Your treatment has been planned according to current medical practices, but problems sometimes occur. Call your health care provider if you have any problems  or questions after your procedure. °What can I expect after the procedure? °After your procedure, it is common: °· To feel sleepy for several hours. °· To feel clumsy and have poor balance for several hours. °· To have poor judgment for several hours. °· To vomit if you eat too soon. ° °Follow these instructions at home: °For at least 24 hours after the procedure: ° °· Do not: °? Participate in activities where you could fall or become injured. °? Drive. °? Use heavy machinery. °? Drink alcohol. °? Take sleeping pills or medicines that cause drowsiness. °? Make important decisions or sign legal documents. °? Take care of children on your own. °· Rest. °Eating and drinking °· Follow the diet recommended by your health care provider. °· If you vomit: °? Drink water, juice, or soup when you can drink without vomiting. °? Make sure you have little or no nausea before eating solid foods. °General instructions °· Have a responsible adult stay with you until   you are awake and alert. °· Take over-the-counter and prescription medicines only as told by your health care provider. °· If you smoke, do not smoke without supervision. °· Keep all follow-up visits as told by your health care provider. This is important. °Contact a health care provider if: °· You keep feeling nauseous or you keep vomiting. °· You feel light-headed. °· You develop a rash. °· You have a fever. °Get help right away if: °· You have trouble breathing. °This information is not intended to replace advice given to you by your health care provider. Make sure you discuss any questions you have with your health care provider. °Document Released: 04/03/2013 Document Revised: 11/16/2015 Document Reviewed: 10/03/2015 °Elsevier Interactive Patient Education © 2018 [employer] Inc[employer] ° °

## 2018-06-04 ENCOUNTER — Telehealth: Payer: Self-pay | Admitting: Gastroenterology

## 2018-06-04 DIAGNOSIS — K753 Granulomatous hepatitis, not elsewhere classified: Secondary | ICD-10-CM

## 2018-06-04 DIAGNOSIS — K7110 Toxic liver disease with hepatic necrosis, without coma: Secondary | ICD-10-CM

## 2018-06-04 NOTE — Telephone Encounter (Signed)
Patient is asking about the liver bx results.  Please advise

## 2018-06-04 NOTE — Telephone Encounter (Signed)
Per Dr. Fuller Plan patient also need CXR 2 view.  Patient notified of the results and the new recommendations She agrees to proceed She will come for CXR and labs tomorrow.  Referral faxed to [facility] - she is aware that she will be contacted directly with an appt

## 2018-06-04 NOTE — Telephone Encounter (Signed)
Necrotizing granulomatous inflammation noted (no evidence of malignancy) TB and fungal infections are possible causes however the path stains were negative for both  Sarcoidosis is a possible cause Send quantiferon gold and ACE As it is challenging to establish a definitive diagnosis I recommend she has an evaluation by a hepatologist to further diagnose and treat.  [facility] referral  No path letter needed

## 2018-06-08 ENCOUNTER — Other Ambulatory Visit: Payer: 59

## 2018-06-08 ENCOUNTER — Ambulatory Visit (INDEPENDENT_AMBULATORY_CARE_PROVIDER_SITE_OTHER)
Admission: RE | Admit: 2018-06-08 | Discharge: 2018-06-08 | Disposition: A | Discharge status: Home / Self Care | Payer: 59 | Source: Ambulatory Visit | Attending: Gastroenterology | Admitting: Gastroenterology

## 2018-06-08 DIAGNOSIS — K753 Granulomatous hepatitis, not elsewhere classified: Secondary | ICD-10-CM

## 2018-06-08 DIAGNOSIS — K7110 Toxic liver disease with hepatic necrosis, without coma: Secondary | ICD-10-CM | POA: Diagnosis not present

## 2018-06-09 DIAGNOSIS — S62609A Fracture of unspecified phalanx of unspecified finger, initial encounter for closed fracture: Secondary | ICD-10-CM | POA: Diagnosis not present

## 2018-06-11 LAB — QUANTIFERON-TB GOLD PLUS
Mitogen-NIL: 9.44 IU/mL
NIL: 0.04 IU/mL
QuantiFERON-TB Gold Plus: NEGATIVE
TB1-NIL: 0.01 IU/mL
TB2-NIL: 0.01 IU/mL

## 2018-06-11 LAB — ANGIOTENSIN CONVERTING ENZYME: Angiotensin-Converting Enzyme: 19 U/L (ref 9–67)

## 2018-06-11 NOTE — Telephone Encounter (Signed)
Patient has been scheduled with [facility] for 07/18/18.

## 2018-06-28 DIAGNOSIS — S6392XA Sprain of unspecified part of left wrist and hand, initial encounter: Secondary | ICD-10-CM | POA: Diagnosis not present

## 2018-07-05 ENCOUNTER — Other Ambulatory Visit: Payer: Self-pay | Admitting: Nurse Practitioner

## 2018-07-11 DIAGNOSIS — S6392XD Sprain of unspecified part of left wrist and hand, subsequent encounter: Secondary | ICD-10-CM | POA: Diagnosis not present

## 2018-07-11 DIAGNOSIS — Z6837 Body mass index (BMI) 37.0-37.9, adult: Secondary | ICD-10-CM | POA: Diagnosis not present

## 2018-07-18 DIAGNOSIS — K753 Granulomatous hepatitis, not elsewhere classified: Secondary | ICD-10-CM | POA: Diagnosis not present

## 2018-07-19 DIAGNOSIS — M25642 Stiffness of left hand, not elsewhere classified: Secondary | ICD-10-CM | POA: Diagnosis not present

## 2018-07-24 DIAGNOSIS — M79642 Pain in left hand: Secondary | ICD-10-CM | POA: Diagnosis not present

## 2018-07-24 DIAGNOSIS — M79645 Pain in left finger(s): Secondary | ICD-10-CM | POA: Diagnosis not present

## 2018-07-24 DIAGNOSIS — M25642 Stiffness of left hand, not elsewhere classified: Secondary | ICD-10-CM | POA: Diagnosis not present

## 2018-08-01 DIAGNOSIS — M79645 Pain in left finger(s): Secondary | ICD-10-CM | POA: Diagnosis not present

## 2018-08-01 DIAGNOSIS — M79642 Pain in left hand: Secondary | ICD-10-CM | POA: Diagnosis not present

## 2018-08-01 DIAGNOSIS — M25642 Stiffness of left hand, not elsewhere classified: Secondary | ICD-10-CM | POA: Diagnosis not present

## 2018-08-02 DIAGNOSIS — H109 Unspecified conjunctivitis: Secondary | ICD-10-CM | POA: Diagnosis not present

## 2018-08-07 ENCOUNTER — Telehealth: Payer: Self-pay

## 2018-08-07 NOTE — Telephone Encounter (Signed)
-----   Message from Ladene Artist, MD sent at 08/06/2018  5:25 PM EST ----- Complicated patient now being worked up of [place] for multiple liver lesions and biopsies show necrotizing granulomas. TB negative. ACE normal. Concern is for a possible neuroendocrine tumor with metastases.  Dawn Drazek called me late today and asked [place] to schedule EGD. Direct EGD with me is fine. Would wait until Wednesday to call the patient as [facility] is planning to call her tomorrow to let her know the recommendation to proceed with EGD and their other recommendations.

## 2018-08-07 NOTE — Telephone Encounter (Signed)
Left message for patient to call back  

## 2018-08-08 DIAGNOSIS — K753 Granulomatous hepatitis, not elsewhere classified: Secondary | ICD-10-CM | POA: Diagnosis not present

## 2018-08-08 DIAGNOSIS — M79642 Pain in left hand: Secondary | ICD-10-CM | POA: Diagnosis not present

## 2018-08-08 NOTE — Telephone Encounter (Signed)
Patient notified and agrees to proceed with EGD.  She is scheduled for endo and pre-visit in the [facility] 08/17/18 and 08/27/18

## 2018-08-14 DIAGNOSIS — M79642 Pain in left hand: Secondary | ICD-10-CM | POA: Diagnosis not present

## 2018-08-16 DIAGNOSIS — K7110 Toxic liver disease with hepatic necrosis, without coma: Secondary | ICD-10-CM | POA: Diagnosis not present

## 2018-08-17 ENCOUNTER — Ambulatory Visit (AMBULATORY_SURGERY_CENTER): Payer: Self-pay | Admitting: *Deleted

## 2018-08-17 ENCOUNTER — Encounter: Payer: Self-pay | Admitting: Gastroenterology

## 2018-08-17 ENCOUNTER — Other Ambulatory Visit: Payer: Self-pay

## 2018-08-17 VITALS — Ht 63.0 in | Wt 201.4 lb

## 2018-08-17 DIAGNOSIS — K7110 Toxic liver disease with hepatic necrosis, without coma: Secondary | ICD-10-CM

## 2018-08-17 DIAGNOSIS — K753 Granulomatous hepatitis, not elsewhere classified: Secondary | ICD-10-CM

## 2018-08-17 NOTE — Progress Notes (Signed)
No egg or soy allergy known to patient  No issues with past sedation with any surgeries  or procedures, no intubation problems  No diet pills per patient No home 02 use per patient  No blood thinners per patient  No A fib or A flutter  EMMI video sent to pt's e mail

## 2018-08-22 ENCOUNTER — Ambulatory Visit (INDEPENDENT_AMBULATORY_CARE_PROVIDER_SITE_OTHER): Payer: Medicare HMO | Admitting: Infectious Disease

## 2018-08-22 ENCOUNTER — Other Ambulatory Visit (HOSPITAL_COMMUNITY)
Admission: RE | Admit: 2018-08-22 | Discharge: 2018-08-22 | Disposition: A | Discharge status: Home / Self Care | Payer: Medicare HMO | Source: Ambulatory Visit | Attending: Infectious Disease | Admitting: Infectious Disease

## 2018-08-22 ENCOUNTER — Encounter: Payer: Self-pay | Admitting: Infectious Disease

## 2018-08-22 VITALS — BP 125/80 | HR 81 | Temp 98.5°F | Ht 63.0 in | Wt 200.0 lb

## 2018-08-22 DIAGNOSIS — Z86010 Personal history of colonic polyps: Secondary | ICD-10-CM

## 2018-08-22 DIAGNOSIS — K219 Gastro-esophageal reflux disease without esophagitis: Secondary | ICD-10-CM | POA: Insufficient documentation

## 2018-08-22 DIAGNOSIS — K753 Granulomatous hepatitis, not elsewhere classified: Secondary | ICD-10-CM | POA: Insufficient documentation

## 2018-08-22 DIAGNOSIS — K7290 Hepatic failure, unspecified without coma: Secondary | ICD-10-CM

## 2018-08-22 DIAGNOSIS — L928 Other granulomatous disorders of the skin and subcutaneous tissue: Secondary | ICD-10-CM

## 2018-08-22 DIAGNOSIS — K580 Irritable bowel syndrome with diarrhea: Secondary | ICD-10-CM | POA: Diagnosis not present

## 2018-08-22 HISTORY — DX: Granulomatous hepatitis, not elsewhere classified: K75.3

## 2018-08-22 NOTE — Progress Notes (Signed)
Subjective:    Patient ID: [name], female    DOB: 02/04/1966, 53 y.o.   MRN: [id number]  Reason for consult: Necrotizing granulomatous lesions in the liver and likely spleen  Requesting physician:L Roosevelt Locks, NP  HPI   This is a 53 year old African-American woman who has been followed by Dr. Lucio Edward for presumed diagnosis of irritable bowel syndrome.  Her symptoms have typically consisted of cramping abdominal pain occurs for 2 to 3 days before having multiple loose bowel movements she also at times will have diaphoresis and colonic spasm.  She did have some hematochezia and had an unremarkable colonoscopy.  She did have a CT scan that was performed in 2013 that was read as showing calcifications due to "old granulomatous disease" it also had shown some small liver lesions that at the time were thought to be hemangiomas.  More recently she had a CT the abdomen pelvis that showed multiple low-density hyperenhancing lesions in the right and left lobe of the liver that were more pronounced and were concerning for possible hepatocellular carcinoma.  MRI also further elucidated these lesions and again raise the suspicion for malignancy.  She underwent a liver biopsy and this revealed no evidence of malignancy but necrotizing granulomatous inflammation.  Gram stain AFB and fungal smears were negative on the specimen that was submitted in formalin.  She had blood test performed with hepatology and these included a QuantiFERON gold that was negative a slightly elevated sed rate at 28 and ANA that was negative liver function test that were normal, Q fever antibodies that were negative actin smooth muscle antibodies that were negative ceruloplasmin which was negative mitochondrial M2 antibody was negative negative hepatitis panel negative syphilis test negative HIV test and a histoplasma antibody that was that was positive for the yeast phase antibody at a titer of 1-16 but negative for the my seal  fails antibody of less than 1-8.  She was referred to our clinic for further evaluation and treatment. Have a normal angiotensin-converting enzyme level.   [place] in [place] but denies exposures to farm animals and has never had pets such as cats or dogs.  She has never traveled to the [employer].  The area in [place] she [place] up when did have [place] in the area but she did not spend time around the legs or hiking and does not have obvious soil exposure.  She is never been on immune modulating agents.  She has never been to the [employer] and has not been exposed to tuberculosis that she knows of.  Past Medical History:  Diagnosis Date  . Anemia   . Arthritis   . GERD (gastroesophageal reflux disease)   . 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 Irritable bowel syndrome   . Overweight(278.02)   . Personal history of colonic polyps 09/19/2011   tubular adenoma  . Segmental colitis [facility])     Past Surgical History:  Procedure Laterality Date  . COLONOSCOPY    . FACET JOINT INJECTION  03/28/11   injections in neck  . hysterectomy  2000  . KNEE SURGERY Right     Right knee arthroscopic partial lateral meniscectomy (01/22/07),arthroscopic partial lateral meniscectomy 5/09  . POLYPECTOMY    . SPINE SURGERY  10/13/06   Posterior interbody fusion (PLIF) L5-S1 (07/04/07)  . WRIST FRACTURE SURGERY Left 2011   Due to injury at work in 2008.    Family History  Problem  Relation Age of Onset  . Asthma Mother   . Cancer Mother        gastric tumor  . Glaucoma Mother   . Hyperlipidemia Mother   . COPD Mother   . Stomach cancer Mother   . Esophageal cancer Father        mets to lung and liver  . Hypertension Maternal Aunt   . Colon cancer Neg Hx   . Colon polyps Neg Hx   . Rectal cancer Neg Hx       Social History   Socioeconomic History  . Marital status: Married    Spouse name: Not on file  . Number of children: 2  . Years of  education: Not on file  . Highest education level: Not on file  Occupational History  . Occupation: Disabled  Social Needs  . Financial resource strain: Not on file  . Food insecurity:    Worry: Not on file    Inability: Not on file  . Transportation needs:    Medical: Not on file    Non-medical: Not on file  Tobacco Use  . Smoking status: Never Smoker  . Smokeless tobacco: Never Used  Substance and Sexual Activity  . Alcohol use: No    Alcohol/week: 0.0 standard drinks  . Drug use: No  . Sexual activity: Not on file  Lifestyle  . Physical activity:    Days per week: Not on file    Minutes per session: Not on file  . Stress: Not on file  Relationships  . Social connections:    Talks on phone: Not on file    Gets together: Not on file    Attends religious service: Not on file    Active member of club or organization: Not on file    Attends meetings of clubs or organizations: Not on file    Relationship status: Not on file  Other Topics Concern  . Not on file  Social History Narrative   Regular exercise:  No   Caffeine Use:  Occasional sweet tea   She is legally separated   2 children ages 51 year old son and 77 year old daughter.  Both children.   Previously worked in [employer], research (medical), now disabled.   Completed 2 yrs of college.      No Known Allergies   Current Outpatient Medications:  .  dicyclomine (BENTYL) 20 MG tablet, TAKE 1 TABLET BY MOUTH THREE TIMES DAILY (Patient taking differently: Take 20 mg by mouth 3 (three) times daily. ), Disp: 90 tablet, Rfl: 11 .  hyoscyamine (LEVSIN SL) 0.125 MG SL tablet, DISSOLVE 1 TABLET IN MOUTH EVERY 4 HOURS AS NEEDED, Disp: 15 tablet, Rfl: 0 .  methocarbamol (ROBAXIN) 750 MG tablet, Take 750 mg by mouth 2 (two) times daily as needed for muscle spasms., Disp: , Rfl:  .  pantoprazole (PROTONIX) 40 MG tablet, TAKE 1 TABLET BY MOUTH TWICE DAILY, Disp: 60 tablet, Rfl: 11 .  traMADol (ULTRAM) 50 MG tablet, Take 50 mg by mouth 3 (three)  times daily as needed for moderate pain. , Disp: , Rfl:    Review of Systems  Constitutional: Positive for diaphoresis. Negative for activity change, appetite change, chills, fatigue, fever and unexpected weight change.  HENT: Negative for congestion, rhinorrhea, sinus pressure, sneezing, sore throat and trouble swallowing.   Eyes: Negative for photophobia and visual disturbance.  Respiratory: Negative for cough, chest tightness, shortness of breath, wheezing and stridor.   Cardiovascular: Negative for chest pain, palpitations  and leg swelling.  Gastrointestinal: Positive for abdominal pain, constipation and diarrhea. Negative for abdominal distention, anal bleeding, blood in stool, nausea and vomiting.  Genitourinary: Negative for difficulty urinating, dysuria, flank pain and hematuria.  Musculoskeletal: Negative for arthralgias, back pain, gait problem, joint swelling and myalgias.  Skin: Negative for color change, pallor, rash and wound.  Neurological: Negative for dizziness, tremors, weakness and light-headedness.  Hematological: Negative for adenopathy. Does not bruise/bleed easily.  Psychiatric/Behavioral: Negative for agitation, behavioral problems, confusion, decreased concentration, dysphoric mood and sleep disturbance.       Objective:   Physical Exam Constitutional:      General: She is not in acute distress.    Appearance: Normal appearance. She is well-developed. She is not ill-appearing or diaphoretic.  HENT:     Head: Normocephalic and atraumatic.     Right Ear: Hearing and external ear normal.     Left Ear: Hearing and external ear normal.     Nose: No nasal deformity or rhinorrhea.  Eyes:     General: No scleral icterus.    Conjunctiva/sclera: Conjunctivae normal.     Right eye: Right conjunctiva is not injected.     Left eye: Left conjunctiva is not injected.     Pupils: Pupils are equal, round, and reactive to light.  Neck:     Musculoskeletal: Normal range of  motion and neck supple.     Vascular: No JVD.  Cardiovascular:     Rate and Rhythm: Normal rate and regular rhythm.     Heart sounds: Normal heart sounds, S1 normal and S2 normal. No murmur. No friction rub.  Pulmonary:     Effort: Pulmonary effort is normal. No respiratory distress.  Abdominal:     General: Bowel sounds are normal. There is no distension.     Palpations: Abdomen is soft. There is no hepatomegaly, splenomegaly or mass.     Tenderness: There is no abdominal tenderness. There is no guarding or rebound.  Musculoskeletal: Normal range of motion.     Right shoulder: Normal.     Left shoulder: Normal.     Right hip: Normal.     Left hip: Normal.     Right knee: Normal.     Left knee: Normal.  Lymphadenopathy:     Head:     Right side of head: No submandibular, preauricular or posterior auricular adenopathy.     Left side of head: No submandibular, preauricular or posterior auricular adenopathy.     Cervical: No cervical adenopathy.     Right cervical: No superficial or deep cervical adenopathy.    Left cervical: No superficial or deep cervical adenopathy.  Skin:    General: Skin is warm and dry.     Coloration: Skin is not pale.     Findings: No abrasion, bruising, ecchymosis, erythema, lesion or rash.     Nails: There is no clubbing.   Neurological:     Mental Status: She is alert and oriented to person, place, and time.     Sensory: No sensory deficit.     Coordination: Coordination normal.     Gait: Gait normal.  Psychiatric:        Attention and Perception: She is attentive.        Speech: Speech normal.        Behavior: Behavior normal. Behavior is cooperative.        Thought Content: Thought content normal.        Judgment: Judgment normal.  Assessment & Plan:   Necrotizing granulomatous lesions in the liver and old granulomas in the spleen:  This certainly could be due to an infection with a dimorphic fungus such as histoplasma.  The  positive titer 1-16 only 1 dilution away from being negative is not however an overwhelming affirmation of this diagnosis.  Other dimorphic fungi such as cryptococcus Blastomyces and of course extrapulmonary tuberculosis remain in the frontal diagnosis as does sarcoidosis.  The calcifications though would be more suggestive of an infectious process.  At present we will send off serum testing for Blastomyces antigen and urine testing to [facility] labs for histoplasma antigen.  I will check a serum cryptococcal antigen.  Asked the pathology lab to send tissue to the [place] [facility] for PCR testing for broad range for AFB PCR, histoplasma PCR cryptococcus and Coccidioides PCR as well as toxoplasma PCR and and broad ranging bacterial PCR.  This work-up does not reveal diagnosis we very likely will need to have a repeat liver biopsy but this time have a specimen sent in a sterile container that is not been touched by formalin to the microbiology lab so that we can then send off specimens for AFB culture fungal culture and for PCR testing on a specimen that has not been compromised by formalin.  I spent greater than 80 minutes with the patient including greater than 50% of time in face to face counsel of the patient the differential diagnosis reviewing her history labs radiographic data and and and in coordination of care.

## 2018-08-24 LAB — CRYPTOCOCCAL AG, LTX SCR RFLX TITER
Cryptococcal Ag Screen: NOT DETECTED
MICRO NUMBER: 245151
SPECIMEN QUALITY:: ADEQUATE

## 2018-08-24 LAB — RPR: RPR Ser Ql: NONREACTIVE

## 2018-08-27 ENCOUNTER — Encounter: Payer: Self-pay | Admitting: Gastroenterology

## 2018-08-27 ENCOUNTER — Ambulatory Visit (AMBULATORY_SURGERY_CENTER): Payer: Medicare HMO | Admitting: Gastroenterology

## 2018-08-27 ENCOUNTER — Other Ambulatory Visit: Payer: Self-pay

## 2018-08-27 VITALS — BP 163/93 | HR 73 | Temp 98.6°F | Resp 18 | Ht 63.0 in | Wt 201.0 lb

## 2018-08-27 DIAGNOSIS — K7110 Toxic liver disease with hepatic necrosis, without coma: Secondary | ICD-10-CM

## 2018-08-27 DIAGNOSIS — K219 Gastro-esophageal reflux disease without esophagitis: Secondary | ICD-10-CM

## 2018-08-27 DIAGNOSIS — K753 Granulomatous hepatitis, not elsewhere classified: Secondary | ICD-10-CM

## 2018-08-27 DIAGNOSIS — Z139 Encounter for screening, unspecified: Secondary | ICD-10-CM

## 2018-08-27 LAB — HISTOPLASMA ANTIGEN (BLD, CSF, BRONCH WASH, OTHER)
Interpretation:: NEGATIVE
RESULT:: NOT DETECTED ng/mL

## 2018-08-27 LAB — MVISTA BLASTOMYCES QNT AG, URINE
Interpretation: NEGATIVE
Result (ng/ml): NOT DETECTED ng/mL

## 2018-08-27 MED ORDER — SODIUM CHLORIDE 0.9 % IV SOLN: 500.0000 mL | Freq: Once | INTRAVENOUS | Status: DC

## 2018-08-27 NOTE — Op Note (Signed)
[facility] Patient Name: [name] Procedure Date: 08/27/2018 2:14 PM MRN: [id number] Endoscopist: Ladene Artist , MD Age: 53 Referring MD:  Date of Birth: [birth year] Gender: Female Account #: [id number] Procedure:                Upper GI endoscopy Indications:              GERD. Screening procedure - R/O neoplasm Medicines:                Monitored Anesthesia Care Procedure:                Pre-Anesthesia Assessment:                           - Prior to the procedure, a History and Physical                            was performed, and patient medications and                            allergies were reviewed. The patient's tolerance of                            previous anesthesia was also reviewed. The risks                            and benefits of the procedure and the sedation                            options and risks were discussed with the patient.                            All questions were answered, and informed consent                            was obtained. Prior Anticoagulants: The patient has                            taken no previous anticoagulant or antiplatelet                            agents. ASA Grade Assessment: II - A patient with                            mild systemic disease. After reviewing the risks                            and benefits, the patient was deemed in                            satisfactory condition to undergo the procedure.                           After obtaining informed consent, the endoscope was  passed under direct vision. Throughout the                            procedure, the patient's blood pressure, pulse, and                            oxygen saturations were monitored continuously. The                            Endoscope was introduced through the mouth, and                            advanced to the second part of duodenum. The upper                            GI endoscopy was  accomplished without difficulty.                            The patient tolerated the procedure well. Scope In: Scope Out: Findings:                 The esophagus was normal.                           The stomach was normal.                           The examined duodenum was normal.                           The cardia and gastric fundus were normal on                            retroflexion. Complications:            No immediate complications. Estimated Blood Loss:     Estimated blood loss: none. Impression:               - Normal esophagus.                           - Normal stomach.                           - Normal examined duodenum.                           - No specimens collected. Recommendation:           - Patient has a contact number available for                            emergencies. The signs and symptoms of potential                            delayed complications were discussed with the  patient. Return to normal activities tomorrow.                            Written discharge instructions were provided to the                            patient.                           - Resume previous diet.                           - Continue present medications.                           - Antireflux measures.                           - Follow up with Dr. Tommy Medal and Roosevelt Locks, NP as                            planned. Ladene Artist, MD 08/27/2018 2:37:15 PM This report has been signed electronically.

## 2018-08-27 NOTE — Progress Notes (Signed)
Pt's states no medical or surgical changes since previsit or office visit. 

## 2018-08-27 NOTE — Progress Notes (Signed)
Report to PACU, RN, vss, BBS= Clear.  

## 2018-08-27 NOTE — Patient Instructions (Signed)
   Follow anti reflux measures ( ORANGE HANDOUT GIVEN TO YOU TODAY)  FOLLOW UP W/ DR VAN DAM & DAWN DRAZEK ,NP AS PLANNED   YOU HAD AN ENDOSCOPIC PROCEDURE TODAY AT [facility]:   Refer to the procedure report that was given to you for any specific questions about what was found during the examination.  If the procedure report does not answer your questions, please call your gastroenterologist to clarify.  If you requested that your care partner not be given the details of your procedure findings, then the procedure report has been included in a sealed envelope for you to review at your convenience later.  YOU SHOULD EXPECT: Some feelings of bloating in the abdomen. Passage of more gas than usual.  Walking can help get rid of the air that was put into your GI tract during the procedure and reduce the bloating. If you had a lower endoscopy (such as a colonoscopy or flexible sigmoidoscopy) you may notice spotting of blood in your stool or on the toilet paper. If you underwent a bowel prep for your procedure, you may not have a normal bowel movement for a few days.  Please Note:  You might notice some irritation and congestion in your nose or some drainage.  This is from the oxygen used during your procedure.  There is no need for concern and it should clear up in a day or so.  SYMPTOMS TO REPORT IMMEDIATELY:     Following upper endoscopy (EGD)  Vomiting of blood or coffee ground material  New chest pain or pain under the shoulder blades  Painful or persistently difficult swallowing  New shortness of breath  Fever of 100F or higher  Black, tarry-looking stools  For urgent or emergent issues, a gastroenterologist can be reached at any hour by calling [phone].   DIET:  We do recommend a small meal at first, but then you may proceed to your regular diet.  Drink plenty of fluids but you should avoid alcoholic beverages for 24 hours.  ACTIVITY:  You should plan to take  it easy for the rest of today and you should NOT DRIVE or use heavy machinery until tomorrow (because of the sedation medicines used during the test).    FOLLOW UP: Our staff will call the number listed on your records the next business day following your procedure to check on you and address any questions or concerns that you may have regarding the information given to you following your procedure. If we do not reach you, we will leave a message.  However, if you are feeling well and you are not experiencing any problems, there is no need to return our call.  We will assume that you have returned to your regular daily activities without incident.  If any biopsies were taken you will be contacted by phone or by letter within the next 1-3 weeks.  Please call us at [phone] if you have not heard about the biopsies in 3 weeks.    SIGNATURES/CONFIDENTIALITY: You and/or your care partner have signed paperwork which will be entered into your electronic medical record.  These signatures attest to the fact that that the information above on your After Visit Summary has been reviewed and is understood.  Full responsibility of the confidentiality of this discharge information lies with you and/or your care-partner.

## 2018-08-28 ENCOUNTER — Telehealth: Payer: Self-pay | Admitting: *Deleted

## 2018-08-28 NOTE — Telephone Encounter (Signed)
  Follow up Call-  Call back number 08/27/2018 04/09/2018  Post procedure Call Back phone  # [phone]  Permission to leave phone message Yes Yes  Some recent data might be hidden     Patient questions:  Do you have a fever, pain , or abdominal swelling? No. Pain Score  0 *  Have you tolerated food without any problems? Yes.    Have you been able to return to your normal activities? Yes.    Do you have any questions about your discharge instructions: Diet   No. Medications  No. Follow up visit  No.  Do you have questions or concerns about your Care? No.  Actions: * If pain score is 4 or above: No action needed, pain <4.

## 2018-08-29 ENCOUNTER — Other Ambulatory Visit: Payer: Self-pay | Admitting: Nurse Practitioner

## 2018-09-20 DIAGNOSIS — M25561 Pain in right knee: Secondary | ICD-10-CM | POA: Diagnosis not present

## 2018-09-20 DIAGNOSIS — M542 Cervicalgia: Secondary | ICD-10-CM | POA: Diagnosis not present

## 2018-09-21 ENCOUNTER — Ambulatory Visit (INDEPENDENT_AMBULATORY_CARE_PROVIDER_SITE_OTHER): Payer: Medicare HMO | Admitting: Infectious Disease

## 2018-09-21 ENCOUNTER — Other Ambulatory Visit: Payer: Self-pay

## 2018-09-21 ENCOUNTER — Encounter: Payer: Self-pay | Admitting: Infectious Disease

## 2018-09-21 DIAGNOSIS — Z86010 Personal history of colonic polyps: Secondary | ICD-10-CM | POA: Diagnosis not present

## 2018-09-21 DIAGNOSIS — Z860101 Personal history of adenomatous and serrated colon polyps: Secondary | ICD-10-CM

## 2018-09-21 DIAGNOSIS — K753 Granulomatous hepatitis, not elsewhere classified: Secondary | ICD-10-CM

## 2018-09-21 DIAGNOSIS — K582 Mixed irritable bowel syndrome: Secondary | ICD-10-CM

## 2018-09-21 NOTE — Progress Notes (Signed)
Virtual Visit via Telephone Note  I connected with [name] on 09/21/18 at  9:45 AM EDT by telephone and verified that I am speaking with the correct person using two identifiers.   I discussed the limitations, risks, security and privacy concerns of performing an evaluation and management service by telephone and the availability of in person appointments. I also discussed with the patient that there may be a patient responsible charge related to this service. The patient expressed understanding and agreed to proceed.   History of Present Illness:  53 year old African-American woman who has been followed by Dr. Lucio Edward for presumed diagnosis of irritable bowel syndrome.  Her symptoms have typically consisted of cramping abdominal pain occurs for 2 to 3 days before having multiple loose bowel movements she also at times will have diaphoresis and colonic spasm.  She did have some hematochezia and had an unremarkable colonoscopy.  She did have a CT scan that was performed in 2013 that was read as showing calcifications due to "old granulomatous disease" it also had shown some small liver lesions that at the time were thought to be hemangiomas.  More recently she had a CT the abdomen pelvis that showed multiple low-density hyperenhancing lesions in the right and left lobe of the liver that were more pronounced and were concerning for possible hepatocellular carcinoma.  MRI also further elucidated these lesions and again raise the suspicion for malignancy.  She underwent a liver biopsy and this revealed no evidence of malignancy but necrotizing granulomatous inflammation.  Gram stain AFB and fungal smears were negative on the specimen that was submitted in formalin.  She had blood test performed with hepatology and these included a QuantiFERON gold that was negative a slightly elevated sed rate at 28 and ANA that was negative liver function test that were normal, Q fever antibodies that were  negative actin smooth muscle antibodies that were negative ceruloplasmin which was negative mitochondrial M2 antibody was negative negative hepatitis panel negative syphilis test negative HIV test and a histoplasma antibody that was that was positive for the yeast phase antibody at a titer of 1-16 but negative for the my seal fails antibody of less than 1-8.  She was referred to our clinic for further evaluation and treatment. Have a normal angiotensin-converting enzyme level.  In clinic we tested her urine for histoplasma antigen and this was negative we sent serum for Blastomyces antigen and this was negative.  We did a serum cryptococcal antigen which was negative.  Rechecked an angiotensin-converting enzyme level and it was normal.  I had her tissue sent to [facility] for broad-based PCR testing.  Note this tissue had been in formalin which compromises our ability to detect DNA in the tissue.  The PCR is done in order to wash in [place] were negative for any bacteria by broad-based PCR for bacterial pathogens, it was negative for fungi and nontuberculous mycobacteria by PCR testing.  It was specifically negative for Mycobacterium tuberculosis by PCR testing.  Was also negative for the following organism specifically by PCR testing, Bartonella  Coccidioides in mentis, histoplasma, Blastomyces, cryptococcus, toxoplasma, T. Whipplei.  We converted her regular visit to an ED visit as I did not think that she needed to be physically seen in the clinic for [place] to be able to discuss her results and inform a new plan of action.  Since I last saw her she is developed some cramping on her right side underneath her rib cage.  Otherwise her other symptoms are fairly stable compared to when I saw her last with what sounds like kind of irritable bowel type symptoms.  Her liver function tests were normal when checked last.  She has also undergone an upper endoscopy by Dr. Fuller Plan which was  unremarkable going down into the duodenum.  She had a colonoscopy before which also was unremarkable.  Has some osteoarthritic pains but no swollen joints or anything to suggest an inflammatory arthritis being present.  Otherwise 12 point review of systems is negative.   She grew [place] in [place] area but denies exposures to farm animals and has never had pets such as cats or dogs.  She has never traveled to the [employer].  The area in [place] she grew up when did have numerous [place] in the area but she did not spend time around the legs or hiking and does not have obvious soil exposure.  She is never been on immune modulating agents.  She has never been to the [employer] and has not been exposed to tuberculosis that she knows of.  Past Medical History:  Diagnosis Date  . Anemia   . Arthritis   . GERD (gastroesophageal reflux disease)   . 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 Irritable bowel syndrome   . Overweight(278.02)   . Personal history of colonic polyps 09/19/2011   tubular adenoma  . Segmental colitis [facility])     Past Surgical History:  Procedure Laterality Date  . COLONOSCOPY    . FACET JOINT INJECTION  03/28/11   injections in neck  . hysterectomy  2000  . KNEE SURGERY Right     Right knee arthroscopic partial lateral meniscectomy (01/22/07),arthroscopic partial lateral meniscectomy 5/09  . POLYPECTOMY    . SPINE SURGERY  10/13/06   Posterior interbody fusion (PLIF) L5-S1 (07/04/07)  . WRIST FRACTURE SURGERY Left 2011   Due to injury at work in 2008.    Family History  Problem Relation Age of Onset  . Asthma Mother   . Cancer Mother        gastric tumor  . Glaucoma Mother   . Hyperlipidemia Mother   . COPD Mother   . Stomach cancer Mother   . Esophageal cancer Father        mets to lung and liver  . Hypertension Maternal Aunt   . Colon cancer Neg Hx   . Colon polyps Neg Hx   . Rectal cancer Neg Hx       Social History    Socioeconomic History  . Marital status: Married    Spouse name: Not on file  . Number of children: 2  . Years of education: Not on file  . Highest education level: Not on file  Occupational History  . Occupation: Disabled  Social Needs  . Financial resource strain: Not on file  . Food insecurity:    Worry: Not on file    Inability: Not on file  . Transportation needs:    Medical: Not on file    Non-medical: Not on file  Tobacco Use  . Smoking status: Never Smoker  . Smokeless tobacco: Never Used  Substance and Sexual Activity  . Alcohol use: No    Alcohol/week: 0.0 standard drinks  . Drug use: No  . Sexual activity: Not on file  Lifestyle  . Physical activity:    Days per week: Not on file    Minutes per session: Not on file  .  Stress: Not on file  Relationships  . Social connections:    Talks on phone: Not on file    Gets together: Not on file    Attends religious service: Not on file    Active member of club or organization: Not on file    Attends meetings of clubs or organizations: Not on file    Relationship status: Not on file  Other Topics Concern  . Not on file  Social History Narrative   Regular exercise:  No   Caffeine Use:  Occasional sweet tea   She is legally separated   2 children ages 67 year old son and 55 year old daughter.  Both children.   Previously worked in Scientist, research (medical), now disabled.   Completed 2 yrs of college.      No Known Allergies   Current Outpatient Medications:  .  dicyclomine (BENTYL) 20 MG tablet, TAKE 1 TABLET BY MOUTH THREE TIMES DAILY (Patient taking differently: Take 20 mg by mouth 3 (three) times daily. ), Disp: 90 tablet, Rfl: 11 .  hyoscyamine (LEVSIN SL) 0.125 MG SL tablet, DISSOLVE 1 TABLET IN MOUTH EVERY 4 HOURS AS NEEDED, Disp: 15 tablet, Rfl: 0 .  methocarbamol (ROBAXIN) 750 MG tablet, Take 750 mg by mouth 2 (two) times daily as needed for muscle spasms., Disp: , Rfl:  .  pantoprazole (PROTONIX) 40 MG tablet, TAKE 1  TABLET BY MOUTH TWICE DAILY, Disp: 60 tablet, Rfl: 11 .  traMADol (ULTRAM) 50 MG tablet, Take 50 mg by mouth 3 (three) times daily as needed for moderate pain. , Disp: , Rfl:      Observations/Objective:  Necrotizing granulomas in the liver and likely spleen:  IBS-like symptoms  Gastroesophageal reflux disease Assessment and Plan:  Necrotizing granulomas liver and likely spleen: Given negative work-up through PCR testing and serologies we need to get tissue again.  Recommend repeat liver biopsy with a dedicated sample from the granuloma  Collected in a sterile container WITHOUT formalin so it can be sent for  Fungal cultures and AFB cultures.  Would also recommend sending some of the material again without formalin but with proper media for transportation to the [facility] in [place] for broad-based PCR testing again.  1 could also consider doing a second biopsy to send again to pathology to reexamine this.  My priority though would be on the tissue to be sent for culture and PCR testing without formalin.  If all of her work-up were negative I would be part of of a careful an empiric trial of corticosteroids to treat possible sarcoidosis.   Follow Up Instructions:    I discussed the assessment and treatment plan with the patient. The patient was provided an opportunity to ask questions and all were answered. The patient agreed with the plan and demonstrated an understanding of the instructions.   The patient was advised to call back or seek an in-person evaluation if the symptoms worsen or if the condition fails to improve as anticipated.  I am scheduling her with me in several months time I am also send a note to her hepatologist and her gastroenterologist.  I doubt we will build to get an IR guided biopsy in the current scenario of the pandemic novel coronavirus just beginning to [place]  I provided 22minutes of non-face-to-face time during this  encounter.   Alcide Evener, MD

## 2018-09-27 DIAGNOSIS — J309 Allergic rhinitis, unspecified: Secondary | ICD-10-CM | POA: Diagnosis not present

## 2018-09-27 DIAGNOSIS — R05 Cough: Secondary | ICD-10-CM | POA: Diagnosis not present

## 2018-09-27 DIAGNOSIS — J069 Acute upper respiratory infection, unspecified: Secondary | ICD-10-CM | POA: Diagnosis not present

## 2018-10-03 DIAGNOSIS — J019 Acute sinusitis, unspecified: Secondary | ICD-10-CM | POA: Diagnosis not present

## 2018-10-03 DIAGNOSIS — R05 Cough: Secondary | ICD-10-CM | POA: Diagnosis not present

## 2018-10-03 DIAGNOSIS — Z6835 Body mass index (BMI) 35.0-35.9, adult: Secondary | ICD-10-CM | POA: Diagnosis not present

## 2018-10-08 ENCOUNTER — Other Ambulatory Visit: Payer: Self-pay | Admitting: Gastroenterology

## 2018-10-18 ENCOUNTER — Telehealth: Payer: Self-pay | Admitting: Gastroenterology

## 2018-10-18 DIAGNOSIS — K7110 Toxic liver disease with hepatic necrosis, without coma: Secondary | ICD-10-CM | POA: Diagnosis not present

## 2018-10-18 MED ORDER — HYOSCYAMINE SULFATE 0.125 MG SL SUBL: SUBLINGUAL_TABLET | SUBLINGUAL | 1 refills | Status: DC

## 2018-10-18 NOTE — Telephone Encounter (Signed)
Patient would like a refill for her hyoscyamine (LEVSIN SL) 0.125 MG SL tablet

## 2018-10-18 NOTE — Telephone Encounter (Signed)
Refill of Levsin sent to patient's pharmacy. Patient notified.

## 2018-10-19 ENCOUNTER — Ambulatory Visit
Admission: RE | Admit: 2018-10-19 | Discharge: 2018-10-19 | Disposition: A | Discharge status: Home / Self Care | Payer: Medicare HMO | Source: Ambulatory Visit | Attending: Family Medicine | Admitting: Family Medicine

## 2018-10-19 ENCOUNTER — Other Ambulatory Visit: Payer: Self-pay | Admitting: Family Medicine

## 2018-10-19 DIAGNOSIS — R05 Cough: Secondary | ICD-10-CM | POA: Diagnosis not present

## 2018-10-19 DIAGNOSIS — R059 Cough, unspecified: Secondary | ICD-10-CM

## 2018-10-24 DIAGNOSIS — Z136 Encounter for screening for cardiovascular disorders: Secondary | ICD-10-CM | POA: Diagnosis not present

## 2018-10-24 DIAGNOSIS — R05 Cough: Secondary | ICD-10-CM | POA: Diagnosis not present

## 2018-10-26 ENCOUNTER — Telehealth: Payer: Self-pay | Admitting: Gastroenterology

## 2018-10-26 NOTE — Telephone Encounter (Signed)
Patient put on for Doximity visit on 10/31/18 2:30.  Patient recently seen by her PCP at [facility] and they recommended that she contact Dr. Fuller Plan to discuss

## 2018-10-30 ENCOUNTER — Encounter: Payer: Self-pay | Admitting: General Surgery

## 2018-10-31 ENCOUNTER — Other Ambulatory Visit: Payer: Self-pay

## 2018-10-31 ENCOUNTER — Ambulatory Visit (INDEPENDENT_AMBULATORY_CARE_PROVIDER_SITE_OTHER): Payer: Medicare HMO | Admitting: Gastroenterology

## 2018-10-31 ENCOUNTER — Encounter: Payer: Self-pay | Admitting: Gastroenterology

## 2018-10-31 VITALS — Ht 63.0 in | Wt 190.0 lb

## 2018-10-31 DIAGNOSIS — K219 Gastro-esophageal reflux disease without esophagitis: Secondary | ICD-10-CM | POA: Diagnosis not present

## 2018-10-31 DIAGNOSIS — R059 Cough, unspecified: Secondary | ICD-10-CM

## 2018-10-31 DIAGNOSIS — K589 Irritable bowel syndrome without diarrhea: Secondary | ICD-10-CM | POA: Diagnosis not present

## 2018-10-31 DIAGNOSIS — R05 Cough: Secondary | ICD-10-CM

## 2018-10-31 MED ORDER — PANTOPRAZOLE SODIUM 40 MG PO TBEC: 40.0000 mg | DELAYED_RELEASE_TABLET | Freq: Two times a day (BID) | ORAL | 11 refills | Status: DC

## 2018-10-31 MED ORDER — FAMOTIDINE 40 MG PO TABS: 40.0000 mg | ORAL_TABLET | Freq: Every day | ORAL | 11 refills | Status: DC

## 2018-10-31 NOTE — Progress Notes (Signed)
    History of Present Illness: This is a 53 year old female with GERD who now has a persistent cough. She relates several weeks of a cough initially productive of mucous however it is now dry. She was evaluated by her PCP and treated with Tessalon, certirizine, mucinex, Augmentin for sinusitis, allergic rhinitis and her cough did not resolve. EGD in 08/2018 was normal. Pt occasionally notes heartburn that improves with TUMS.   Current Medications, Allergies, Past Medical History, Past Surgical History, Family History and Social History were reviewed in [employer] record.   Physical Exam: Telemedicine - not performed   Assessment and Recommendations:  1. GERD however it is not clear that her cough is related to GERD with LPR.  Increase pantoprazole to 40 mg po bid. Add famotidine 40 mg hs. Closely follow all antireflux measures including continued use of wedge pillow during recumbency. REV in 3 weeks.   2. Cough. As above, not clear that this is GERD related. If not responding to intensified antireflux treatment will proceed with Pulmonary referral.   3. IBS. Continue Levsin 0.125 mg po qh4 prn.   4. Necrotizing hepatic granulomas, etiology unclear. Follow up with ID and Hepatology as planned.    These services were provided via telemedicine, audio only per patient preference.  The patient was at home and the provider was in the office, alone.  We discussed the limitations of evaluation and management by telemedicine and the availability of in person appointments.  Patient consented for this telemedicine visit and is aware of possible charges for this service.  The other person participating in the telemedicine service was Marlon Pel, CMA who reviewed medications, allergies, past history and completed AVS.  Time spent on call: 8 minutes   cc: Christa See, NP

## 2018-10-31 NOTE — Patient Instructions (Addendum)
Increase your pantoprazole 40 mg twice daily and add famotidine 40 mg at bedtime. These prescriptions have been sent to your pharmacy.   Patient advised to avoid spicy, acidic, citrus, chocolate, mints, fruit and fruit juices.  Limit the intake of caffeine, alcohol and Soda.  Don't exercise too soon after eating.  Don't lie down within 3-4 hours of eating.  Elevate the head of your bed.  Continue Levsin 0.125 mg by mouth every 4 hours as needed.   Doximity follow up visit with Dr. Fuller Plan is on 11/22/18 at 9:30am.   Thank you for choosing me and [facility].  Pricilla Riffle. Dagoberto Ligas., MD., Mar[facility] Regal

## 2018-11-08 ENCOUNTER — Other Ambulatory Visit: Payer: Self-pay | Admitting: Gastroenterology

## 2018-11-15 ENCOUNTER — Other Ambulatory Visit: Payer: Self-pay | Admitting: Nurse Practitioner

## 2018-11-15 DIAGNOSIS — K7110 Toxic liver disease with hepatic necrosis, without coma: Secondary | ICD-10-CM

## 2018-11-22 ENCOUNTER — Ambulatory Visit (INDEPENDENT_AMBULATORY_CARE_PROVIDER_SITE_OTHER): Payer: Medicare HMO | Admitting: Gastroenterology

## 2018-11-22 ENCOUNTER — Other Ambulatory Visit: Payer: Self-pay

## 2018-11-22 ENCOUNTER — Encounter: Payer: Self-pay | Admitting: Gastroenterology

## 2018-11-22 VITALS — Ht 63.0 in | Wt 190.0 lb

## 2018-11-22 DIAGNOSIS — K219 Gastro-esophageal reflux disease without esophagitis: Secondary | ICD-10-CM | POA: Diagnosis not present

## 2018-11-22 DIAGNOSIS — R05 Cough: Secondary | ICD-10-CM | POA: Diagnosis not present

## 2018-11-22 DIAGNOSIS — R059 Cough, unspecified: Secondary | ICD-10-CM

## 2018-11-22 NOTE — Progress Notes (Signed)
    History of Present Illness: This is a 53 year old female returning for chronic cough and GERD.  She was previously evaluated on May 6 for the same problems.  It was not clear that her cough was related to GERD with LPR.  Pantoprazole was increased to twice daily and famotidine was added at bedtime along with instructions to elevate the head of her bed or a routinely use wedge pillow in bed.  She states she has had occasional episodes of heartburn that she is treated with Rolaids and Tums which have been effective.  Breakthrough heartburn has not been related to her dry cough.  She notes no association of her cough with recumbency or meals.  She states her cough tends to occur randomly during the day and will persist for period of time and then should be asymptomatic for several hours.  This pattern has not improved on intensified antireflux therapy.  Current Medications, Allergies, Past Medical History, Past Surgical History, Family History and Social History were reviewed in [employer] record.   Physical Exam: Telemedicine - not performed    Assessment and Recommendations:  1. GERD with LPR.  Persistent reflux symptoms however reflux is not clearly established as the etiology of her cough.  I am concerned about other causes of chronic cough as her cough has not responeded to intensive antireflux therapy. She has an appointment with pulmonary, Dr. Melvyn Novas, on June 26.  For now continue pantoprazole 40 mg twice daily before breakfast and dinner for at least 3 months. Continue famotidine 40 mg daily at bedtime for at least 3 months.  Tums or Rolaids as needed for breakthrough heartburn symptoms.  Continue head of bed elevation or the use of a wedge pillow for recumbency.  Continue to closely follow all standard antireflux measures. REV in 2 months.   2. Chronic cough.  As above.  3. Necrotizing hepatic granulomas, etiology unclear. Follow up with ID and Hepatology as planned.     These services were provided via telemedicine, audio and video attempted however patient had technical difficulties so visit was changed to audio only.  The patient was at home and the provider was in the office, alone.  We discussed the limitations of evaluation and management by telemedicine and the availability of in person appointments.  Patient consented for this telemedicine visit and is aware of possible charges for this service.  The other person participating in the telemedicine service was a [place] who reviewed medications, allergies, past history and completed AVS.  Time spent on call: 10 minutes

## 2018-11-22 NOTE — Patient Instructions (Signed)
Continue Pantoprazole 40 mg twice a day, Famotidine 40 mg daily at bedtime for at least 3 months. Can continue Tums or rolaids as needed for breakthrough symptoms.   Patient advised to avoid spicy, acidic, citrus, chocolate, mints, fruit and fruit juices.  Limit the intake of caffeine, alcohol and Soda.  Don't exercise too soon after eating.  Don't lie down within 3-4 hours of eating.  Elevate the head of your bed or use a wedge pillow.  Call back to make a return office visit in 2 months.

## 2018-11-27 ENCOUNTER — Other Ambulatory Visit: Payer: Self-pay

## 2018-11-27 ENCOUNTER — Encounter: Payer: Self-pay | Admitting: Emergency Medicine

## 2018-11-27 ENCOUNTER — Ambulatory Visit (INDEPENDENT_AMBULATORY_CARE_PROVIDER_SITE_OTHER): Payer: Medicare HMO | Admitting: Emergency Medicine

## 2018-11-27 DIAGNOSIS — R059 Cough, unspecified: Secondary | ICD-10-CM

## 2018-11-27 DIAGNOSIS — R05 Cough: Secondary | ICD-10-CM

## 2018-11-27 DIAGNOSIS — R053 Chronic cough: Secondary | ICD-10-CM

## 2018-11-27 NOTE — Progress Notes (Signed)
Subjective:    Patient ID: [name], female    DOB: 07-19-1965, 53 y.o.   MRN: [id number]  HPI 53 year old overweight never smoker with a history of irritable bowel syndrome, GERD, history of granulomatous liver disease that was biopsied and showed necrotizing granulomatous findings with negative AFB, fungal smears and cultures (but done in formalin).  ACE level normal.  She is been seen in gastroenterology and also infectious diseases clinic for this. Another liver biopsy is planned to facilitate culture data, planning for abdominal MRI saturday  She is referred today to evaluate cough.  She tells me that this started about 1 - 2 months ago. Didn't seem to be precipitated by URI, etc. Was initially productive of some light green / white mucous, now dry. Was trialed on cetirizine + Augmentin (possible sinusitis) + mucinex, tessalon perles.  She feels reflux, has chest discomfort substernal. An EGD 08/2018 was normal.  Her pantoprazole was increased 10/31/2018 20 to 40 mg twice daily and famotidine was added by Dr Fuller Plan. Denies any current nasal congestion or HA. She does not wheeze. Cough can wake her from sleep, can be worse at night.   She has not had a dedicated CT chest.  Chest x-ray from 4/24 reviewed and shows no infiltrates or abnormality that would explain cough.   She does not snore, does have daytime sleepiness.    Review of Systems  Constitutional: Negative for fever and unexpected weight change.  HENT: Negative for congestion, dental problem, ear pain, nosebleeds, postnasal drip, rhinorrhea, sinus pressure, sneezing, sore throat and trouble swallowing.   Eyes: Negative for redness and itching.  Respiratory: Positive for cough. Negative for chest tightness, shortness of breath and wheezing.   Cardiovascular: Negative for palpitations and leg swelling.  Gastrointestinal: Negative for nausea and vomiting.  Genitourinary: Negative for dysuria.  Musculoskeletal: Negative for joint  swelling.  Skin: Negative for rash.  Neurological: Negative for headaches.  Hematological: Does not bruise/bleed easily.  Psychiatric/Behavioral: Negative for dysphoric mood. The patient is not nervous/anxious.     Past Medical History:  Diagnosis Date  . Anemia   . Arthritis   . GERD (gastroesophageal reflux disease)   . 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 Irritable bowel syndrome   . Overweight(278.02)   . Personal history of colonic polyps 09/19/2011   tubular adenoma  . Segmental colitis ([place])      Family History  Problem Relation Age of Onset  . Asthma Mother   . Cancer Mother        gastric tumor  . Glaucoma Mother   . Hyperlipidemia Mother   . COPD Mother   . Stomach cancer Mother   . Esophageal cancer Father        mets to lung and liver  . Hypertension Maternal Aunt   . Colon cancer Neg Hx   . Colon polyps Neg Hx   . Rectal cancer Neg Hx     No hx sarcoidosis in family.   Social History   Socioeconomic History  . Marital status: Divorced    Spouse name: Not on file  . Number of children: 2  . Years of education: Not on file  . Highest education level: Not on file  Occupational History  . Occupation: Disabled  Social Needs  . Financial resource strain: Not on file  . Food insecurity:    Worry: Not on file    Inability: Not on file  . Transportation needs:    Medical: Not on file  Non-medical: Not on file  Tobacco Use  . Smoking status: Never Smoker  . Smokeless tobacco: Never Used  Substance and Sexual Activity  . Alcohol use: No    Alcohol/week: 0.0 standard drinks  . Drug use: No  . Sexual activity: Not on file  Lifestyle  . Physical activity:    Days per week: Not on file    Minutes per session: Not on file  . Stress: Not on file  Relationships  . Social connections:    Talks on phone: Not on file    Gets together: Not on file    Attends religious service: Not on file    Active member of club or organization: Not on file    Attends  meetings of clubs or organizations: Not on file    Relationship status: Not on file  . Intimate partner violence:    Fear of current or ex partner: Not on file    Emotionally abused: Not on file    Physically abused: Not on file    Forced sexual activity: Not on file  Other Topics Concern  . Not on file  Social History Narrative   Regular exercise:  No   Caffeine Use:  Occasional sweet tea   She is legally separated   2 children ages 34 year old son and 34 year old daughter.  Both children.   Previously worked in Scientist, research (medical), now disabled.   Completed 2 yrs of college.       No Known Allergies   Outpatient Medications Prior to Visit  Medication Sig Dispense Refill  . Ca Carbonate-Mag Hydroxide (ROLAIDS PO) Take by mouth as needed.    . dicyclomine (BENTYL) 20 MG tablet TAKE 1 TABLET BY MOUTH THREE TIMES DAILY (Patient taking differently: Take 20 mg by mouth 3 (three) times daily. ) 90 tablet 11  . famotidine (PEPCID) 40 MG tablet Take 1 tablet (40 mg total) by mouth at bedtime. 30 tablet 11  . hyoscyamine (LEVSIN SL) 0.125 MG SL tablet DISSOLVE 1 TABLET IN MOUTH EVERY 4 HOURS AS NEEDED 30 tablet 1  . methocarbamol (ROBAXIN) 750 MG tablet Take 750 mg by mouth 2 (two) times daily as needed for muscle spasms.    . pantoprazole (PROTONIX) 40 MG tablet Take 1 tablet by mouth twice daily 60 tablet 0  . traMADol (ULTRAM) 50 MG tablet Take 50 mg by mouth 3 (three) times daily as needed for moderate pain.      No facility-administered medications prior to visit.         Objective:   Physical Exam Vitals:   11/27/18 0858  BP: 128/84  Pulse: 87  SpO2: 94%  Weight: 198 lb (89.8 kg)  Height: 5\' 3"  (1.6 m)   Gen: Pleasant, overwt woman, in no distress,  normal affect  ENT: No lesions,  mouth clear,  oropharynx clear, crowded post pharynx, no postnasal drip  Neck: No JVD, no stridor  Lungs: No use of accessory muscles, no crackles or wheezing on normal respiration, no wheeze on forced  expiration  Cardiovascular: RRR, heart sounds normal, no murmur or gallops, no peripheral edema  Musculoskeletal: No deformities, no cyanosis or clubbing  Neuro: alert, awake, non focal  Skin: Warm, no lesions or rash      Assessment & Plan:  Chronic cough Over 1 month duration, unclear trigger.  She does remember having a URI or inciting event.  Suspect based on the description of her symptoms that this is being driven by esophageal  reflux.  This would be the most common cause.  She did not benefit from cetirizine, empiric treatment for sinusitis.  Her GERD regimen was recently increased and I believe we should give this a chance to show whether she can improve.  Also discussed cough suppression, voice rest with her.  Given her history of hepatic granulomatous disease and the ongoing work-up certainly must consider other causes.  She is at risk for granulomatous lung disease.  An ACE level has been normal in the past, no clear evidence for cutaneous or pulmonary sarcoidosis but possible.  Certainly consider fungal disease (hepatic work-up is still ongoing).  Plan for PFT, CT chest, repeat ACE level.  If her pulmonary function testing is consistent with obstruction than would consider serum precipitans to fungal exposures.   Baltazar Apo, MD, PhD 11/27/2018, 9:40 AM [facility] [phone] or if no answer [phone]

## 2018-11-27 NOTE — Patient Instructions (Signed)
Please continue your pantoprazole twice a day and famotidine as recently prescribed by Dr. Fuller Plan Try to practice a diet that will avoid esophageal reflux. Try to rest your voice, avoid throat clearing if at all possible Use Hycodan 5 cc up to every 6 hours if needed to suppress your cough. We will perform pulmonary function testing We will perform a CT scan of your chest to evaluate for any granulomatous disease. We will repeat blood work today Follow with Dr Lamonte Sakai or APP in 4-5 weeks to discuss your symptoms and review your testing

## 2018-11-27 NOTE — Assessment & Plan Note (Signed)
Over 1 month duration, unclear trigger.  She does remember having a URI or inciting event.  Suspect based on the description of her symptoms that this is being driven by esophageal reflux.  This would be the most common cause.  She did not benefit from cetirizine, empiric treatment for sinusitis.  Her GERD regimen was recently increased and I believe we should give this a chance to show whether she can improve.  Also discussed cough suppression, voice rest with her.  Given her history of hepatic granulomatous disease and the ongoing work-up certainly must consider other causes.  She is at risk for granulomatous lung disease.  An ACE level has been normal in the past, no clear evidence for cutaneous or pulmonary sarcoidosis but possible.  Certainly consider fungal disease (hepatic work-up is still ongoing).  Plan for PFT, CT chest, repeat ACE level.  If her pulmonary function testing is consistent with obstruction than would consider serum precipitans to fungal exposures.

## 2018-11-28 LAB — ANGIOTENSIN CONVERTING ENZYME: Angiotensin-Converting Enzyme: 34 U/L (ref 9–67)

## 2018-11-29 ENCOUNTER — Other Ambulatory Visit: Payer: Self-pay

## 2018-11-29 ENCOUNTER — Telehealth: Payer: Self-pay

## 2018-11-29 MED ORDER — HYDROCODONE-HOMATROPINE 5-1.5 MG/5ML PO SYRP: 5.0000 mL | ORAL_SOLUTION | Freq: Four times a day (QID) | ORAL | 0 refills | Status: DC | PRN

## 2018-11-29 NOTE — Telephone Encounter (Signed)
Assessment & Plan Note by Collene Gobble, MD at 11/27/2018 9:36 AM  Author: Collene Gobble, MD Author Type: Physician Filed: 11/27/2018 9:40 AM  Note Status: Written Cosign: Cosign Not Required Encounter Date: 11/27/2018  Problem: Chronic cough  Editor: Collene Gobble, MD (Physician)    Over 1 month duration, unclear trigger.  She does remember having a URI or inciting event.  Suspect based on the description of her symptoms that this is being driven by esophageal reflux.  This would be the most common cause.  She did not benefit from cetirizine, empiric treatment for sinusitis.  Her GERD regimen was recently increased and I believe we should give this a chance to show whether she can improve.  Also discussed cough suppression, voice rest with her.  Given her history of hepatic granulomatous disease and the ongoing work-up certainly must consider other causes.  She is at risk for granulomatous lung disease.  An ACE level has been normal in the past, no clear evidence for cutaneous or pulmonary sarcoidosis but possible.  Certainly consider fungal disease (hepatic work-up is still ongoing).  Plan for PFT, CT chest, repeat ACE level.  If her pulmonary function testing is consistent with obstruction than would consider serum precipitans to fungal exposures.    Patient Instructions by Collene Gobble, MD at 11/27/2018 9:00 AM  Author: Collene Gobble, MD Author Type: Physician Filed: 11/27/2018 9:35 AM  Note Status: Signed Cosign: Cosign Not Required Encounter Date: 11/27/2018  Editor: Collene Gobble, MD (Physician)    Please continue your pantoprazole twice a day and famotidine as recently prescribed by Dr. Fuller Plan Try to practice a diet that will avoid esophageal reflux. Try to rest your voice, avoid throat clearing if at all possible Use Hycodan 5 cc up to every 6 hours if needed to suppress your cough. We will perform pulmonary function testing We will perform a CT scan of your chest to evaluate for  any granulomatous disease. We will repeat blood work today Follow with Dr Lamonte Sakai or APP in 4-5 weeks to discuss your symptoms and review your testing      Called and spoke with pt asking her if RB had said that he would call in Hycodan for her when she had her OV with him 6/2 and pt stated that [place] said he would call it in. No med has been called in to the pharmacy for pt but in RB's instructions from the [facility], it is stated on there for pt to use Hycodan 5cc up to every 6 hours if needed to suppress her cough.  Kenney Houseman, based on this, please advise if you will be okay sending Rx for Hycodan to pt's pharmacy of [facility] on [address]. Thanks!

## 2018-11-29 NOTE — Telephone Encounter (Signed)
Please send as med request. Thanks.  

## 2018-11-29 NOTE — Telephone Encounter (Signed)
Refill request for Hycodan

## 2018-11-29 NOTE — Telephone Encounter (Signed)
Refill request sent to [facility].

## 2018-12-01 ENCOUNTER — Other Ambulatory Visit: Payer: Self-pay

## 2018-12-01 ENCOUNTER — Ambulatory Visit
Admission: RE | Admit: 2018-12-01 | Discharge: 2018-12-01 | Disposition: A | Discharge status: Home / Self Care | Payer: Medicare HMO | Source: Ambulatory Visit | Attending: Nurse Practitioner | Admitting: Nurse Practitioner

## 2018-12-01 DIAGNOSIS — K7689 Other specified diseases of liver: Secondary | ICD-10-CM | POA: Diagnosis not present

## 2018-12-01 DIAGNOSIS — K7110 Toxic liver disease with hepatic necrosis, without coma: Secondary | ICD-10-CM

## 2018-12-01 MED ORDER — GADOXETATE DISODIUM 0.25 MMOL/ML IV SOLN
9.0000 mL | Freq: Once | INTRAVENOUS | Status: AC | PRN
  Administered 2018-12-01: 9 mL via INTRAVENOUS

## 2018-12-03 NOTE — Telephone Encounter (Signed)
Hycodan was refilled for pt 6/4 by [facility]. Nothing further needed.

## 2018-12-06 ENCOUNTER — Other Ambulatory Visit: Payer: Self-pay | Admitting: Gastroenterology

## 2018-12-12 ENCOUNTER — Ambulatory Visit (INDEPENDENT_AMBULATORY_CARE_PROVIDER_SITE_OTHER)
Admission: RE | Admit: 2018-12-12 | Discharge: 2018-12-12 | Disposition: A | Discharge status: Home / Self Care | Payer: Medicare HMO | Source: Ambulatory Visit | Attending: Emergency Medicine | Admitting: Emergency Medicine

## 2018-12-12 ENCOUNTER — Other Ambulatory Visit: Payer: Self-pay

## 2018-12-12 DIAGNOSIS — R059 Cough, unspecified: Secondary | ICD-10-CM

## 2018-12-12 DIAGNOSIS — R05 Cough: Secondary | ICD-10-CM

## 2018-12-12 DIAGNOSIS — R918 Other nonspecific abnormal finding of lung field: Secondary | ICD-10-CM | POA: Diagnosis not present

## 2018-12-17 DIAGNOSIS — M542 Cervicalgia: Secondary | ICD-10-CM | POA: Diagnosis not present

## 2018-12-19 ENCOUNTER — Telehealth: Payer: Self-pay | Admitting: Emergency Medicine

## 2018-12-19 DIAGNOSIS — K7110 Toxic liver disease with hepatic necrosis, without coma: Secondary | ICD-10-CM | POA: Diagnosis not present

## 2018-12-19 NOTE — Telephone Encounter (Signed)
Called and spoke with pt letting her know the information from [facility] on the CT results. Stated to her that Eustaquio Maize is also wanting to have RB look at the scan and give his input on the results as well and I told her after he reviews it, we will let her know what he says. Pt expressed understanding. Routing to Dr. Lamonte Sakai.

## 2018-12-19 NOTE — Telephone Encounter (Signed)
CT showed subpleural nodularity in left lower lobe otherwise clear. Findings non-specific, sarcoid not exclude. Recommend Dr. Sudie Bailey in-put as he is here tomorrow. Thanks

## 2018-12-19 NOTE — Telephone Encounter (Signed)
Pt had CT performed 6/17 and is calling requesting to know the results. Beth, please advise on this for pt. Thank you!

## 2018-12-20 DIAGNOSIS — M5032 Other cervical disc degeneration, mid-cervical region, unspecified level: Secondary | ICD-10-CM | POA: Diagnosis not present

## 2018-12-20 DIAGNOSIS — M542 Cervicalgia: Secondary | ICD-10-CM | POA: Diagnosis not present

## 2018-12-20 NOTE — Telephone Encounter (Signed)
ATC Patient.  LMTCB when available.  

## 2018-12-20 NOTE — Telephone Encounter (Signed)
Called and spoke with Patient.  CT results and recommendations from Dr Lamonte Sakai given.  Understanding stated.  Patient is scheduled 01/04/19, with Benjamine Mola, NP.  Nothing further at this time.

## 2018-12-20 NOTE — Telephone Encounter (Signed)
Please let her know that I reviewed her CT scan of the chest.  There are a few small subpleural nodules that look most consistent with enlarged lymph nodes.  This would make sense taken in the context of her granulomatous disease is been noted in her liver.  I suspect that this reflects sarcoidosis or at least the same process that is being evaluated in the liver.  There is nothing on the chest CT that needs to be treated at this time.  We will discuss in person, determine the timing with which we will follow the small nodules to make sure they are not changing or leading to problems.

## 2018-12-20 NOTE — Telephone Encounter (Signed)
Pt is calling back [phone]

## 2018-12-21 ENCOUNTER — Institutional Professional Consult (permissible substitution): Payer: Medicare HMO | Admitting: Internal Medicine

## 2018-12-24 ENCOUNTER — Other Ambulatory Visit (HOSPITAL_COMMUNITY): Payer: Self-pay | Admitting: Nurse Practitioner

## 2018-12-24 DIAGNOSIS — K753 Granulomatous hepatitis, not elsewhere classified: Secondary | ICD-10-CM

## 2018-12-24 DIAGNOSIS — K7110 Toxic liver disease with hepatic necrosis, without coma: Secondary | ICD-10-CM

## 2018-12-26 ENCOUNTER — Telehealth: Payer: Self-pay | Admitting: Infectious Disease

## 2018-12-26 NOTE — Telephone Encounter (Signed)
When [name] is getting her liver biopsy so that then I can be alerted once it is been done and make sure that the specimen sent off to [facility] if it is being processed by a [facility] versus communicating with the lab that is receiving her specimens.  She should also have an appointment with [place] as well.

## 2018-12-26 NOTE — Telephone Encounter (Signed)
Spoke with Ritu in Ultrasound.  She confirmed your order notes/sample without formalin to be sent to [facility].  Patient is scheduled for biopsy 7/9, is following up with you 7/14. RN gave Ritu your pager number. Landis Gandy, RN

## 2018-12-27 ENCOUNTER — Other Ambulatory Visit: Payer: Self-pay | Admitting: Emergency Medicine

## 2018-12-27 NOTE — Telephone Encounter (Signed)
I sent a copy of the [facility] form to both [facility] and radiology for clarification during collection.

## 2018-12-27 NOTE — Telephone Encounter (Signed)
Maybe I should send them a copy of the form and what I would want sent off for?

## 2018-12-30 NOTE — Telephone Encounter (Signed)
Perfect

## 2019-01-01 ENCOUNTER — Other Ambulatory Visit (HOSPITAL_COMMUNITY)
Admission: RE | Admit: 2019-01-01 | Discharge: 2019-01-01 | Disposition: A | Discharge status: Home / Self Care | Payer: Medicare HMO | Source: Ambulatory Visit | Attending: Emergency Medicine | Admitting: Emergency Medicine

## 2019-01-01 DIAGNOSIS — Z1159 Encounter for screening for other viral diseases: Secondary | ICD-10-CM | POA: Diagnosis not present

## 2019-01-01 DIAGNOSIS — Z01812 Encounter for preprocedural laboratory examination: Secondary | ICD-10-CM | POA: Insufficient documentation

## 2019-01-01 LAB — SARS CORONAVIRUS 2 (TAT 6-24 HRS): SARS Coronavirus 2: NEGATIVE

## 2019-01-02 ENCOUNTER — Other Ambulatory Visit: Payer: Self-pay | Admitting: Student

## 2019-01-02 ENCOUNTER — Other Ambulatory Visit: Payer: Self-pay | Admitting: Radiology

## 2019-01-03 ENCOUNTER — Encounter (HOSPITAL_COMMUNITY): Payer: Self-pay

## 2019-01-03 ENCOUNTER — Ambulatory Visit (HOSPITAL_COMMUNITY)
Admission: RE | Admit: 2019-01-03 | Discharge: 2019-01-03 | Disposition: A | Discharge status: Home / Self Care | Payer: Medicare HMO | Source: Ambulatory Visit | Attending: Nurse Practitioner | Admitting: Nurse Practitioner

## 2019-01-03 ENCOUNTER — Other Ambulatory Visit: Payer: Self-pay

## 2019-01-03 DIAGNOSIS — Z5309 Procedure and treatment not carried out because of other contraindication: Secondary | ICD-10-CM | POA: Insufficient documentation

## 2019-01-03 DIAGNOSIS — Z79899 Other long term (current) drug therapy: Secondary | ICD-10-CM | POA: Insufficient documentation

## 2019-01-03 DIAGNOSIS — K7689 Other specified diseases of liver: Secondary | ICD-10-CM | POA: Diagnosis not present

## 2019-01-03 DIAGNOSIS — K769 Liver disease, unspecified: Secondary | ICD-10-CM | POA: Diagnosis not present

## 2019-01-03 DIAGNOSIS — K589 Irritable bowel syndrome without diarrhea: Secondary | ICD-10-CM | POA: Insufficient documentation

## 2019-01-03 DIAGNOSIS — Z800 Family history of malignant neoplasm of digestive organs: Secondary | ICD-10-CM | POA: Insufficient documentation

## 2019-01-03 DIAGNOSIS — K7110 Toxic liver disease with hepatic necrosis, without coma: Secondary | ICD-10-CM

## 2019-01-03 DIAGNOSIS — K219 Gastro-esophageal reflux disease without esophagitis: Secondary | ICD-10-CM | POA: Insufficient documentation

## 2019-01-03 DIAGNOSIS — Z86010 Personal history of colonic polyps: Secondary | ICD-10-CM | POA: Diagnosis not present

## 2019-01-03 DIAGNOSIS — Z825 Family history of asthma and other chronic lower respiratory diseases: Secondary | ICD-10-CM | POA: Diagnosis not present

## 2019-01-03 DIAGNOSIS — K753 Granulomatous hepatitis, not elsewhere classified: Secondary | ICD-10-CM

## 2019-01-03 LAB — CBC
HCT: 35.2 % — ABNORMAL LOW (ref 36.0–46.0)
Hemoglobin: 11.5 g/dL — ABNORMAL LOW (ref 12.0–15.0)
MCH: 28.8 pg (ref 26.0–34.0)
MCHC: 32.7 g/dL (ref 30.0–36.0)
MCV: 88.2 fL (ref 80.0–100.0)
Platelets: 426 10*3/uL — ABNORMAL HIGH (ref 150–400)
RBC: 3.99 MIL/uL (ref 3.87–5.11)
RDW: 13.4 % (ref 11.5–15.5)
WBC: 7.4 10*3/uL (ref 4.0–10.5)
nRBC: 0 % (ref 0.0–0.2)

## 2019-01-03 LAB — PROTIME-INR
INR: 1 (ref 0.8–1.2)
Prothrombin Time: 13.3 seconds (ref 11.4–15.2)

## 2019-01-03 MED ORDER — FENTANYL CITRATE (PF) 100 MCG/2ML IJ SOLN
INTRAMUSCULAR | Status: AC
  Filled 2019-01-03: qty 2

## 2019-01-03 MED ORDER — GELATIN ABSORBABLE 12-7 MM EX MISC
CUTANEOUS | Status: AC
  Filled 2019-01-03: qty 1

## 2019-01-03 MED ORDER — SODIUM CHLORIDE 0.9 % IV SOLN
INTRAVENOUS | Status: AC | PRN
  Administered 2019-01-03: 10 mL/h via INTRAVENOUS

## 2019-01-03 MED ORDER — LIDOCAINE HCL (PF) 1 % IJ SOLN
INTRAMUSCULAR | Status: AC
  Filled 2019-01-03: qty 30

## 2019-01-03 MED ORDER — MIDAZOLAM HCL 2 MG/2ML IJ SOLN
INTRAMUSCULAR | Status: AC | PRN
  Administered 2019-01-03: 0.5 mg via INTRAVENOUS
  Administered 2019-01-03: 1 mg via INTRAVENOUS
  Administered 2019-01-03: 0.5 mg via INTRAVENOUS

## 2019-01-03 MED ORDER — FENTANYL CITRATE (PF) 100 MCG/2ML IJ SOLN
INTRAMUSCULAR | Status: AC | PRN
  Administered 2019-01-03: 25 ug via INTRAVENOUS
  Administered 2019-01-03: 50 ug via INTRAVENOUS
  Administered 2019-01-03: 25 ug via INTRAVENOUS

## 2019-01-03 MED ORDER — MIDAZOLAM HCL 2 MG/2ML IJ SOLN
INTRAMUSCULAR | Status: AC
  Filled 2019-01-03: qty 2

## 2019-01-03 MED ORDER — SODIUM CHLORIDE 0.9 % IV SOLN: INTRAVENOUS | Status: DC

## 2019-01-03 NOTE — Procedures (Signed)
Interventional Radiology Procedure:   Indications: History of liver lesions and necrotizing granulomas. Plan for re-biopsy of a liver  Procedure: Attempted US guided liver lesion biopsy.  Findings: Subtle lesion along inferior right hepatic lobe but could not confidently see after patient was sedated and respirations changed.  Biopsy not performed.    Complications: None     EBL: less than 5 ml  Plan: Discharge to home, will need to discuss with referring providers.     [name] R. Anselm Pancoast, MD  Pager: [phone]

## 2019-01-03 NOTE — Discharge Instructions (Signed)

## 2019-01-03 NOTE — H&P (Addendum)
Chief Complaint: Patient was seen in consultation today for liver core biopsy at the request of Drazek,Dawn  Referring Physician(s): Drazek,Dawn  Supervising Physician: Markus Daft  Patient Status: [facility]  History of Present Illness: [name] is a 53 y.o. 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 Anemia   . Arthritis   . GERD (gastroesophageal reflux disease)   . 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 Irritable bowel syndrome   . Overweight(278.02)   . Personal history of colonic polyps 09/19/2011   tubular adenoma  . Segmental colitis [facility])     Past Surgical History:  Procedure Laterality Date  . COLONOSCOPY    . FACET JOINT INJECTION  03/28/11   injections in neck  . hysterectomy  2000  . KNEE SURGERY Right     Right knee arthroscopic partial lateral meniscectomy (01/22/07),arthroscopic partial lateral meniscectomy 5/09  . POLYPECTOMY    . SPINE SURGERY  10/13/06   Posterior interbody fusion (PLIF) L5-S1 (07/04/07)  . WRIST FRACTURE SURGERY Left 2011   Due to injury at work in 2008.    Allergies: Patient has no known allergies.  Medications: Prior to Admission medications   Medication Sig Start Date End Date Taking? Authorizing Provider  dicyclomine (BENTYL) 20 MG tablet TAKE 1 TABLET BY MOUTH THREE TIMES DAILY Patient taking differently: Take 20 mg by mouth 3 (three) times daily.  01/18/18  Yes Ladene Artist, MD  famotidine (PEPCID) 40 MG tablet Take 1 tablet (40 mg total) by mouth at bedtime. 10/31/18  Yes Ladene Artist, MD  hyoscyamine (LEVSIN SL) 0.125 MG SL tablet DISSOLVE 1 TABLET IN MOUTH EVERY 4 HOURS AS NEEDED Patient taking differently: Take 0.125 mg by mouth every 4 (four) hours as needed for cramping.  10/18/18  Yes Ladene Artist, MD  methocarbamol (ROBAXIN) 750 MG  tablet Take 750 mg by mouth 2 (two) times daily as needed for muscle spasms.   Yes [provider]  pantoprazole (PROTONIX) 40 MG tablet Take 1 tablet by mouth twice daily Patient taking differently: Take 40 mg by mouth 2 (two) times daily.  12/06/18  Yes Ladene Artist, MD  traMADol (ULTRAM) 50 MG tablet Take 50 mg by mouth 3 (three) times daily as needed for moderate pain.    Yes [provider]  omeprazole (PRILOSEC) 20 MG capsule Take 20 mg by mouth daily.  09/08/11  [provider]     Family History  Problem Relation Age of Onset  . Asthma Mother   . Cancer Mother        gastric tumor  . Glaucoma Mother   . Hyperlipidemia Mother   . COPD Mother   . Stomach cancer Mother   . Esophageal cancer Father        mets to lung and liver  . Hypertension Maternal Aunt   . Colon cancer Neg Hx   . Colon polyps Neg Hx   . Rectal cancer Neg Hx     Social History   Socioeconomic History  . Marital status: Divorced    Spouse name: Not on file  . Number of children: 2  . Years of education: Not on file  . Highest education level: Not on file  Occupational History  . Occupation: Disabled  Social Needs  . Emergency planning/management officer  strain: Not on file  . Food insecurity    Worry: Not on file    Inability: Not on file  . Transportation needs    Medical: Not on file    Non-medical: Not on file  Tobacco Use  . Smoking status: Never Smoker  . Smokeless tobacco: Never Used  Substance and Sexual Activity  . Alcohol use: No    Alcohol/week: 0.0 standard drinks  . Drug use: No  . Sexual activity: Yes  Lifestyle  . Physical activity    Days per week: Not on file    Minutes per session: Not on file  . Stress: Not on file  Relationships  . Social Herbalist on phone: Not on file    Gets together: Not on file    Attends religious service: Not on file    Active member of club or organization: Not on file    Attends meetings of clubs or organizations: Not  on file    Relationship status: Not on file  Other Topics Concern  . Not on file  Social History Narrative   Regular exercise:  No   Caffeine Use:  Occasional sweet tea   She is legally separated   2 children ages 8 year old son and 61 year old daughter.  Both children.   Previously worked in Scientist, research (medical), now disabled.   Completed 2 yrs of college.      Review of Systems: A 12 point ROS discussed and pertinent positives are indicated in the HPI above.  All other systems are negative.  Review of Systems  Constitutional: Negative for activity change and fatigue.  Respiratory: Negative for cough and shortness of breath.   Cardiovascular: Negative for chest pain.  Gastrointestinal: Negative for abdominal pain.  Neurological: Negative for weakness.  Psychiatric/Behavioral: Negative for behavioral problems and confusion.    Vital Signs: BP (!) 141/89   Pulse 82   Temp 97.9 F (36.6 C) (Oral)   Resp 16   Ht 5\' 3"  (1.6 m)   Wt 190 lb (86.2 kg)   LMP 02/25/2009   SpO2 99%   BMI 33.66 kg/m   Physical Exam Vitals signs reviewed.  Cardiovascular:     Rate and Rhythm: Normal rate and regular rhythm.     Heart sounds: Normal heart sounds.  Pulmonary:     Effort: Pulmonary effort is normal.     Breath sounds: Normal breath sounds.  Abdominal:     Tenderness: There is no abdominal tenderness.  Musculoskeletal: Normal range of motion.  Skin:    General: Skin is warm and dry.  Neurological:     Mental Status: She is alert and oriented to person, place, and time.  Psychiatric:        Mood and Affect: Mood normal.        Behavior: Behavior normal.        Thought Content: Thought content normal.        Judgment: Judgment normal.     Imaging: Ct Chest Wo Contrast  Result Date: 12/12/2018 CLINICAL DATA:  Persistent dry cough for 1 month. EXAM: CT CHEST WITHOUT CONTRAST TECHNIQUE: Multidetector CT imaging of the chest was performed following the standard protocol without IV  contrast. COMPARISON:  None. FINDINGS: Cardiovascular: Heart is at the upper limits of normal in size. No pericardial effusion. Mediastinum/Nodes: Partially calcified mediastinal lymph nodes. No pathologically enlarged mediastinal or axillary lymph nodes. Hilar regions are difficult to definitively evaluate without IV contrast. Esophagus is grossly  unremarkable. Lungs/Pleura: Subpleural nodularity in the left lower lobe and along the left major fissure. Lungs are otherwise clear. No pleural fluid. Airway is unremarkable. Upper Abdomen: Benign calcifications in the liver and spleen. Visualized portions of the liver, gallbladder, adrenal glands, kidneys, spleen, pancreas, stomach and bowel are otherwise unremarkable. No upper abdominal adenopathy. Musculoskeletal: Degenerative changes in the spine. No worrisome lytic or sclerotic lesions. IMPRESSION: Subpleural nodularity along the left major fissure and left lower lobe is nonspecific. Sarcoid is not excluded. Electronically Signed   By: Lorin Picket M.D.   On: 12/12/2018 10:50    Labs:  CBC: Recent Labs    04/24/18 1423 05/30/18 0630  WBC 7.6 10.4  HGB 11.5* 11.2*  HCT 34.5* 36.4  PLT 407.0* 343    COAGS: Recent Labs    05/30/18 0630  INR 1.07  APTT 31    BMP: Recent Labs    04/24/18 1423  NA 141  K 3.7  CL 103  CO2 31  GLUCOSE 106*  BUN 7  CALCIUM 9.3  CREATININE 0.76    LIVER FUNCTION TESTS: Recent Labs    04/24/18 1423  BILITOT 0.3  AST 20  ALT 13  ALKPHOS 84  PROT 7.5  ALBUMIN 3.8    TUMOR MARKERS: No results for input(s): AFPTM, CEA, CA199, CHROMGRNA in the last 8760 hours.  Assessment and Plan:  Necrotizing granuloma- liver  Not consistent with sarcoid For core biopsy Risks and benefits of liver core biopsy was discussed with the patient and/or patient's family including, but not limited to bleeding, infection, damage to adjacent structures or low yield requiring additional tests.  All of the  questions were answered and there is agreement to proceed. Consent signed and in chart.   Thank you for this interesting consult.  I greatly enjoyed meeting [facility] and look forward to participating in their care.  A copy of this report was sent to the requesting provider on this date.  Electronically Signed: Lavonia Drafts, PA-C 01/03/2019, 6:51 AM   I spent a total of  30 Minutes   in face to face in clinical consultation, greater than 50% of which was counseling/coordinating care for liver core bx

## 2019-01-04 ENCOUNTER — Ambulatory Visit: Payer: Medicare HMO | Admitting: Primary Care

## 2019-01-04 ENCOUNTER — Telehealth: Payer: Self-pay

## 2019-01-04 NOTE — Telephone Encounter (Signed)
Roosevelt Locks, NP with [facility] calling to discuss patient with Dr Tommy Medal.  Patient was in her office on Tuesday for a liver biopsy. They were not able to obtain a sample of tissue.   Would like to discuss next steps. Patient is scheduled to see Dr Tommy Medal next week.  Please call [phone] to discuss mutual patient.   Laverle Patter, RN

## 2019-01-08 ENCOUNTER — Ambulatory Visit (INDEPENDENT_AMBULATORY_CARE_PROVIDER_SITE_OTHER): Payer: Medicare HMO | Admitting: Infectious Disease

## 2019-01-08 ENCOUNTER — Other Ambulatory Visit: Payer: Self-pay

## 2019-01-08 ENCOUNTER — Other Ambulatory Visit: Payer: Self-pay | Admitting: Infectious Disease

## 2019-01-08 VITALS — BP 160/108 | HR 76 | Temp 98.0°F | Ht 64.0 in | Wt 198.0 lb

## 2019-01-08 DIAGNOSIS — K753 Granulomatous hepatitis, not elsewhere classified: Secondary | ICD-10-CM

## 2019-01-08 DIAGNOSIS — K529 Noninfective gastroenteritis and colitis, unspecified: Secondary | ICD-10-CM | POA: Diagnosis not present

## 2019-01-08 DIAGNOSIS — R10816 Epigastric abdominal tenderness: Secondary | ICD-10-CM | POA: Diagnosis not present

## 2019-01-08 NOTE — Addendum Note (Signed)
Addended by: Dolan Amen D on: 01/08/2019 05:04 PM   Modules accepted: Orders

## 2019-01-08 NOTE — Progress Notes (Signed)
Subjective:    Patient ID: [name], female    DOB: Feb 17, 1966, 53 y.o.   MRN: [id number]  HPI 53 year old African-American woman who has been followed by Dr. Lucio Edward for presumed diagnosis of irritable bowel syndrome. Her symptoms have typically consisted of cramping abdominal pain occurs for 2 to 3 days before having multiple loose bowel movements she also at times will have diaphoresis and colonic spasm. She did have some hematochezia and had an unremarkable colonoscopy. She did have a CT scan that was performed in 2013 that was read as showing calcifications due to "old granulomatous disease" it also had shown some small liver lesions that at the time were thought to be hemangiomas. More recently she had a CT the abdomen pelvis that showed multiple low-density hyperenhancing lesions in the right and left lobe of the liver that were more pronounced and were concerning for possible hepatocellular carcinoma. MRI also further elucidated these lesions and again raise the suspicion for malignancy. She underwent a liver biopsy and this revealed no evidence of malignancy but necrotizing granulomatous inflammation. Gram stain AFB and fungal smears were negative on the specimen that was submitted in formalin. She had blood test performed with hepatology and these included a QuantiFERON gold that was negative a slightly elevated sed rate at 28 and ANA that was negative liver function test that were normal, Q fever antibodies that were negative actin smooth muscle antibodies that were negative ceruloplasmin which was negative mitochondrial M2 antibody was negative negative hepatitis panel negative syphilis test negative HIV test and a histoplasma antibody that was that was positive for the yeast phase antibody at a titer of 1-16 but negative for the my seal fails antibody of less than 1-8. She was referred to our clinic for further evaluation and treatment. Have a normal angiotensin-converting  enzyme level.  In clinic we tested her urine for histoplasma antigen and this was negative we sent serum for Blastomyces antigen and this was negative.  We did a serum cryptococcal antigen which was negative.  Rechecked an angiotensin-converting enzyme level and it was normal.  I had her tissue sent to [facility] for broad-based PCR testing.  Note this tissue had been in formalin which compromises our ability to detect DNA in the tissue.  The PCR is done in order to wash in [place] were negative for any bacteria by broad-based PCR for bacterial pathogens, it was negative for fungi and nontuberculous mycobacteria by PCR testing.  It was specifically negative for Mycobacterium tuberculosis by PCR testing.  Was also negative for the following organism specifically by PCR testing, Bartonella  Coccidioides in mentis, histoplasma, Blastomyces, cryptococcus, toxoplasma, T. Whipplei.  Since I last saw [name] attempts were made under ultrasound guidance to perform repeat liver biopsy with the intention of sending this tissue for the [place] [facility] for PCR testing and also potentially for culture for AFB and fungi.  In talking to her today she still continues to have some symptoms of abdominal pain and diarrhea that is alleviated by been till.  She does also have night sweats which she had initially attributed to the bouts of GI complaints but that she is now wondering whether or not but they might be due to menopausal symptoms.  On talking to her about her travel history she did mention that she used to travel to [facility] for work.  I therefore would like to go ahead and send some antibodies to for Coccidioides  Past Medical History:  Diagnosis Date  . Anemia   . Arthritis   . GERD (gastroesophageal reflux disease)   . 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 Irritable bowel syndrome   . Overweight(278.02)   . Personal history of colonic polyps 09/19/2011    tubular adenoma  . Segmental colitis [facility])     Past Surgical History:  Procedure Laterality Date  . COLONOSCOPY    . FACET JOINT INJECTION  03/28/11   injections in neck  . hysterectomy  2000  . KNEE SURGERY Right     Right knee arthroscopic partial lateral meniscectomy (01/22/07),arthroscopic partial lateral meniscectomy 5/09  . POLYPECTOMY    . SPINE SURGERY  10/13/06   Posterior interbody fusion (PLIF) L5-S1 (07/04/07)  . WRIST FRACTURE SURGERY Left 2011   Due to injury at work in 2008.    Family History  Problem Relation Age of Onset  . Asthma Mother   . Cancer Mother        gastric tumor  . Glaucoma Mother   . Hyperlipidemia Mother   . COPD Mother   . Stomach cancer Mother   . Esophageal cancer Father        mets to lung and liver  . Hypertension Maternal Aunt   . Colon cancer Neg Hx   . Colon polyps Neg Hx   . Rectal cancer Neg Hx       Social History   Socioeconomic History  . Marital status: Divorced    Spouse name: Not on file  . Number of children: 2  . Years of education: Not on file  . Highest education level: Not on file  Occupational History  . Occupation: Disabled  Social Needs  . Financial resource strain: Not on file  . Food insecurity    Worry: Not on file    Inability: Not on file  . Transportation needs    Medical: Not on file    Non-medical: Not on file  Tobacco Use  . Smoking status: Never Smoker  . Smokeless tobacco: Never Used  Substance and Sexual Activity  . Alcohol use: No    Alcohol/week: 0.0 standard drinks  . Drug use: No  . Sexual activity: Yes  Lifestyle  . Physical activity    Days per week: Not on file    Minutes per session: Not on file  . Stress: Not on file  Relationships  . Social Herbalist on phone: Not on file    Gets together: Not on file    Attends religious service: Not on file    Active member of club or organization: Not on file    Attends meetings of clubs or organizations: Not on file     Relationship status: Not on file  Other Topics Concern  . Not on file  Social History Narrative   Regular exercise:  No   Caffeine Use:  Occasional sweet tea   She is legally separated   2 children ages 93 year old son and 38 year old daughter.  Both children.   Previously worked in Scientist, research (medical), now disabled.   Completed 2 yrs of college.      No Known Allergies   Current Outpatient Medications:  .  dicyclomine (BENTYL) 20 MG tablet, TAKE 1 TABLET BY MOUTH THREE TIMES DAILY (Patient taking differently: Take 20 mg by mouth 3 (three) times daily. ), Disp: 90 tablet, Rfl: 11 .  famotidine (PEPCID) 40 MG tablet, Take 1 tablet (40 mg total) by  mouth at bedtime., Disp: 30 tablet, Rfl: 11 .  hyoscyamine (LEVSIN SL) 0.125 MG SL tablet, DISSOLVE 1 TABLET IN MOUTH EVERY 4 HOURS AS NEEDED (Patient taking differently: Take 0.125 mg by mouth every 4 (four) hours as needed for cramping. ), Disp: 30 tablet, Rfl: 1 .  methocarbamol (ROBAXIN) 750 MG tablet, Take 750 mg by mouth 2 (two) times daily as needed for muscle spasms., Disp: , Rfl:  .  pantoprazole (PROTONIX) 40 MG tablet, Take 1 tablet by mouth twice daily (Patient taking differently: Take 40 mg by mouth 2 (two) times daily. ), Disp: 60 tablet, Rfl: 2 .  traMADol (ULTRAM) 50 MG tablet, Take 50 mg by mouth 3 (three) times daily as needed for moderate pain. , Disp: , Rfl:   Past Medical History:  Diagnosis Date  . Anemia   . Arthritis   . GERD (gastroesophageal reflux disease)   . 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 Irritable bowel syndrome   . Overweight(278.02)   . Personal history of colonic polyps 09/19/2011   tubular adenoma  . Segmental colitis [facility])     Past Surgical History:  Procedure Laterality Date  . COLONOSCOPY    . FACET JOINT INJECTION  03/28/11   injections in neck  . hysterectomy  2000  . KNEE SURGERY Right     Right knee arthroscopic partial lateral meniscectomy (01/22/07),arthroscopic partial lateral meniscectomy 5/09  .  POLYPECTOMY    . SPINE SURGERY  10/13/06   Posterior interbody fusion (PLIF) L5-S1 (07/04/07)  . WRIST FRACTURE SURGERY Left 2011   Due to injury at work in 2008.    Family History  Problem Relation Age of Onset  . Asthma Mother   . Cancer Mother        gastric tumor  . Glaucoma Mother   . Hyperlipidemia Mother   . COPD Mother   . Stomach cancer Mother   . Esophageal cancer Father        mets to lung and liver  . Hypertension Maternal Aunt   . Colon cancer Neg Hx   . Colon polyps Neg Hx   . Rectal cancer Neg Hx       Social History   Socioeconomic History  . Marital status: Divorced    Spouse name: Not on file  . Number of children: 2  . Years of education: Not on file  . Highest education level: Not on file  Occupational History  . Occupation: Disabled  Social Needs  . Financial resource strain: Not on file  . Food insecurity    Worry: Not on file    Inability: Not on file  . Transportation needs    Medical: Not on file    Non-medical: Not on file  Tobacco Use  . Smoking status: Never Smoker  . Smokeless tobacco: Never Used  Substance and Sexual Activity  . Alcohol use: No    Alcohol/week: 0.0 standard drinks  . Drug use: No  . Sexual activity: Yes  Lifestyle  . Physical activity    Days per week: Not on file    Minutes per session: Not on file  . Stress: Not on file  Relationships  . Social Herbalist on phone: Not on file    Gets together: Not on file    Attends religious service: Not on file    Active member of club or organization: Not on file    Attends meetings of clubs or organizations: Not on file  Relationship status: Not on file  Other Topics Concern  . Not on file  Social History Narrative   Regular exercise:  No   Caffeine Use:  Occasional sweet tea   She is legally separated   2 children ages 12 year old son and 79 year old daughter.  Both children.   Previously worked in Scientist, research (medical), now disabled.   Completed 2 yrs of college.       No Known Allergies   Current Outpatient Medications:  .  dicyclomine (BENTYL) 20 MG tablet, TAKE 1 TABLET BY MOUTH THREE TIMES DAILY (Patient taking differently: Take 20 mg by mouth 3 (three) times daily. ), Disp: 90 tablet, Rfl: 11 .  famotidine (PEPCID) 40 MG tablet, Take 1 tablet (40 mg total) by mouth at bedtime., Disp: 30 tablet, Rfl: 11 .  hyoscyamine (LEVSIN SL) 0.125 MG SL tablet, DISSOLVE 1 TABLET IN MOUTH EVERY 4 HOURS AS NEEDED (Patient taking differently: Take 0.125 mg by mouth every 4 (four) hours as needed for cramping. ), Disp: 30 tablet, Rfl: 1 .  methocarbamol (ROBAXIN) 750 MG tablet, Take 750 mg by mouth 2 (two) times daily as needed for muscle spasms., Disp: , Rfl:  .  pantoprazole (PROTONIX) 40 MG tablet, Take 1 tablet by mouth twice daily (Patient taking differently: Take 40 mg by mouth 2 (two) times daily. ), Disp: 60 tablet, Rfl: 2 .  traMADol (ULTRAM) 50 MG tablet, Take 50 mg by mouth 3 (three) times daily as needed for moderate pain. , Disp: , Rfl:     Review of Systems  Constitutional: Positive for diaphoresis. Negative for activity change, appetite change, chills, fatigue, fever and unexpected weight change.  HENT: Negative for congestion, rhinorrhea, sinus pressure, sneezing, sore throat and trouble swallowing.   Eyes: Negative for photophobia and visual disturbance.  Respiratory: Positive for cough. Negative for chest tightness, shortness of breath, wheezing and stridor.   Cardiovascular: Negative for chest pain, palpitations and leg swelling.  Gastrointestinal: Positive for abdominal pain and diarrhea. Negative for abdominal distention, anal bleeding, blood in stool, constipation, nausea and vomiting.  Genitourinary: Negative for difficulty urinating, dysuria, flank pain and hematuria.  Musculoskeletal: Negative for arthralgias, back pain, gait problem, joint swelling and myalgias.  Skin: Negative for color change, pallor, rash and wound.  Neurological:  Negative for dizziness, tremors, weakness and light-headedness.  Hematological: Negative for adenopathy. Does not bruise/bleed easily.  Psychiatric/Behavioral: Negative for agitation, behavioral problems, confusion, decreased concentration, dysphoric mood and sleep disturbance.       Objective:   Physical Exam Constitutional:      General: She is not in acute distress.    Appearance: She is well-developed. She is not diaphoretic.  HENT:     Head: Normocephalic and atraumatic.     Mouth/Throat:     Pharynx: No oropharyngeal exudate.  Eyes:     General: No scleral icterus.    Conjunctiva/sclera: Conjunctivae normal.  Neck:     Musculoskeletal: Normal range of motion and neck supple.  Cardiovascular:     Rate and Rhythm: Normal rate and regular rhythm.     Heart sounds: No murmur. No friction rub. No gallop.   Pulmonary:     Effort: Pulmonary effort is normal. No respiratory distress.     Breath sounds: Normal breath sounds. No wheezing.  Abdominal:     General: Bowel sounds are normal. There is no distension.     Palpations: There is no mass.     Tenderness: There is no abdominal  tenderness.     Hernia: No hernia is present.  Musculoskeletal:        General: No tenderness.  Skin:    General: Skin is warm and dry.     Coloration: Skin is not pale.     Findings: No erythema or rash.  Neurological:     General: No focal deficit present.     Mental Status: She is alert and oriented to person, place, and time. Mental status is at baseline.     Motor: No abnormal muscle tone.     Coordination: Coordination normal.  Psychiatric:        Mood and Affect: Mood normal.        Behavior: Behavior normal.        Thought Content: Thought content normal.        Judgment: Judgment normal.           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  Certainly this could be histoplasma though she does not have much of a great exposure history in terms of exposure to soil or living in a highly endemic area it is certainly not impossible that she has disseminated histoplasmosis.  I find the travel history to to [facility] interesting and wonder if Coccidioides could be a significant dimorphic fungus that could be in play here.  I will send off antibodies by complement fixation and immune deficiency fusion for Coccidioides, and also repeat antibodies for histoplasma.  I will send a Coccidioides antigen from urine  Think however that she is going to need another liver biopsy attempt so they can get tissue to send for PCR testing and potentially also for fungal culture and AFB culture  One thing that we may also end up considering is a therapeutic trial of for example itraconazole.  I spent greater than 25 minutes with the patient including greater than 50% of time in face to face counsel of the patient during the differential for her necrotizing granulomatous hepatitis, changes in the spleen as well as nodules in the lung and in coordination of her care with hepatology and radiology

## 2019-01-09 DIAGNOSIS — K529 Noninfective gastroenteritis and colitis, unspecified: Secondary | ICD-10-CM | POA: Diagnosis not present

## 2019-01-09 DIAGNOSIS — K753 Granulomatous hepatitis, not elsewhere classified: Secondary | ICD-10-CM | POA: Diagnosis not present

## 2019-01-09 DIAGNOSIS — R10816 Epigastric abdominal tenderness: Secondary | ICD-10-CM | POA: Diagnosis not present

## 2019-01-09 NOTE — Addendum Note (Signed)
Addended by: Dolan Amen D on: 01/09/2019 11:33 AM   Modules accepted: Orders

## 2019-01-11 ENCOUNTER — Other Ambulatory Visit: Payer: Self-pay | Admitting: Gastroenterology

## 2019-01-12 LAB — HISTOPLASMA ANTIGEN (BLD, CSF, BRONCH WASH, OTHER)
Interpretation:: NEGATIVE
RESULT:: NOT DETECTED ng/mL

## 2019-01-12 LAB — MVISTA(R)COCCIDIDOIDES ANTIGEN,QUANTITATIVE,EIA

## 2019-01-14 LAB — MVISTA(R)COCCIDIDOIDES ANTIGEN,QUANTITATIVE,EIA
Interpretation:: NEGATIVE
Result:: NOT DETECTED ng/mL

## 2019-01-14 LAB — HISTOPLASMA ANTIBODY COMPLEMENT, FIXATION SERUM
Mycelial Phase Antibody: <1:8 {titer}
Yeast Phase Antibody: 1:8 {titer} — ABNORMAL HIGH

## 2019-01-16 LAB — COCCIDIOIDES AB(IGG,IGM)IMMUNODIFFUSION W/RFX TO COMPLEMENT FIXATION
AB to F Antigen(IgG),ID: NEGATIVE
AB to TP Antigen(IgM),ID: NEGATIVE

## 2019-01-17 ENCOUNTER — Other Ambulatory Visit: Payer: Self-pay | Admitting: Radiology

## 2019-01-17 DIAGNOSIS — M542 Cervicalgia: Secondary | ICD-10-CM | POA: Diagnosis not present

## 2019-01-17 DIAGNOSIS — M5032 Other cervical disc degeneration, mid-cervical region, unspecified level: Secondary | ICD-10-CM | POA: Diagnosis not present

## 2019-01-18 ENCOUNTER — Other Ambulatory Visit: Payer: Self-pay

## 2019-01-18 ENCOUNTER — Ambulatory Visit (HOSPITAL_COMMUNITY)
Admission: RE | Admit: 2019-01-18 | Discharge: 2019-01-18 | Disposition: A | Discharge status: Home / Self Care | Payer: Medicare HMO | Source: Ambulatory Visit | Attending: Infectious Disease | Admitting: Infectious Disease

## 2019-01-18 ENCOUNTER — Encounter (HOSPITAL_COMMUNITY): Payer: Self-pay

## 2019-01-18 DIAGNOSIS — Z79899 Other long term (current) drug therapy: Secondary | ICD-10-CM | POA: Insufficient documentation

## 2019-01-18 DIAGNOSIS — E663 Overweight: Secondary | ICD-10-CM | POA: Insufficient documentation

## 2019-01-18 DIAGNOSIS — K219 Gastro-esophageal reflux disease without esophagitis: Secondary | ICD-10-CM | POA: Insufficient documentation

## 2019-01-18 DIAGNOSIS — K753 Granulomatous hepatitis, not elsewhere classified: Secondary | ICD-10-CM | POA: Diagnosis not present

## 2019-01-18 DIAGNOSIS — Z800 Family history of malignant neoplasm of digestive organs: Secondary | ICD-10-CM | POA: Insufficient documentation

## 2019-01-18 DIAGNOSIS — K7689 Other specified diseases of liver: Secondary | ICD-10-CM | POA: Diagnosis not present

## 2019-01-18 DIAGNOSIS — Z90710 Acquired absence of both cervix and uterus: Secondary | ICD-10-CM | POA: Insufficient documentation

## 2019-01-18 LAB — CBC WITH DIFFERENTIAL/PLATELET
Abs Immature Granulocytes: 0.05 10*3/uL (ref 0.00–0.07)
Basophils Absolute: 0 10*3/uL (ref 0.0–0.1)
Basophils Relative: 0 %
Eosinophils Absolute: 0 10*3/uL (ref 0.0–0.5)
Eosinophils Relative: 0 %
HCT: 35.4 % — ABNORMAL LOW (ref 36.0–46.0)
Hemoglobin: 11.9 g/dL — ABNORMAL LOW (ref 12.0–15.0)
Immature Granulocytes: 0 %
Lymphocytes Relative: 12 %
Lymphs Abs: 1.6 10*3/uL (ref 0.7–4.0)
MCH: 29.9 pg (ref 26.0–34.0)
MCHC: 33.6 g/dL (ref 30.0–36.0)
MCV: 88.9 fL (ref 80.0–100.0)
Monocytes Absolute: 0.9 10*3/uL (ref 0.1–1.0)
Monocytes Relative: 7 %
Neutro Abs: 10.5 10*3/uL — ABNORMAL HIGH (ref 1.7–7.7)
Neutrophils Relative %: 81 %
Platelets: 438 10*3/uL — ABNORMAL HIGH (ref 150–400)
RBC: 3.98 MIL/uL (ref 3.87–5.11)
RDW: 13.2 % (ref 11.5–15.5)
WBC: 13 10*3/uL — ABNORMAL HIGH (ref 4.0–10.5)
nRBC: 0 % (ref 0.0–0.2)

## 2019-01-18 LAB — COMPREHENSIVE METABOLIC PANEL
ALT: 13 U/L (ref 0–44)
AST: 20 U/L (ref 15–41)
Albumin: 3.5 g/dL (ref 3.5–5.0)
Alkaline Phosphatase: 95 U/L (ref 38–126)
Anion gap: 11 (ref 5–15)
BUN: 11 mg/dL (ref 6–20)
CO2: 27 mmol/L (ref 22–32)
Calcium: 9.4 mg/dL (ref 8.9–10.3)
Chloride: 103 mmol/L (ref 98–111)
Creatinine, Ser: 0.78 mg/dL (ref 0.44–1.00)
GFR calc Af Amer: >60 mL/min (ref >60)
GFR calc non Af Amer: >60 mL/min (ref >60)
Glucose, Bld: 100 mg/dL — ABNORMAL HIGH (ref 70–99)
Potassium: 3.3 mmol/L — ABNORMAL LOW (ref 3.5–5.1)
Sodium: 141 mmol/L (ref 135–145)
Total Bilirubin: 0.3 mg/dL (ref 0.3–1.2)
Total Protein: 7.8 g/dL (ref 6.5–8.1)

## 2019-01-18 LAB — PROTIME-INR
INR: 1.1 (ref 0.8–1.2)
Prothrombin Time: 14.2 seconds (ref 11.4–15.2)

## 2019-01-18 MED ORDER — SODIUM CHLORIDE 0.9 % IV SOLN
INTRAVENOUS | Status: DC
  Administered 2019-01-18: 10:00:00 via INTRAVENOUS

## 2019-01-18 MED ORDER — MIDAZOLAM HCL 2 MG/2ML IJ SOLN
INTRAMUSCULAR | Status: AC
  Filled 2019-01-18: qty 4

## 2019-01-18 MED ORDER — MIDAZOLAM HCL 2 MG/2ML IJ SOLN
INTRAMUSCULAR | Status: AC | PRN
  Administered 2019-01-18: 1 mg via INTRAVENOUS
  Administered 2019-01-18: 0.5 mg via INTRAVENOUS
  Administered 2019-01-18: 1 mg via INTRAVENOUS
  Administered 2019-01-18: 0.5 mg via INTRAVENOUS

## 2019-01-18 MED ORDER — FENTANYL CITRATE (PF) 100 MCG/2ML IJ SOLN
INTRAMUSCULAR | Status: AC | PRN
  Administered 2019-01-18 (×2): 50 ug via INTRAVENOUS
  Administered 2019-01-18 (×2): 25 ug via INTRAVENOUS

## 2019-01-18 MED ORDER — FENTANYL CITRATE (PF) 100 MCG/2ML IJ SOLN
INTRAMUSCULAR | Status: AC
  Filled 2019-01-18: qty 4

## 2019-01-18 NOTE — Procedures (Signed)
Interventional Radiology Procedure Note  Procedure: CT guided liver biopsy. .  Complications: None Recommendations:  - Ok to shower tomorrow - Do not submerge for 7 days - Routine care  - Send fresh specimen per Dr. Tommy Medal of ID: ...  she is going to need another liver biopsy attempt so they can get tissue to send for PCR testing and potentially also for fungal culture and AFB culture.     Signed,  Dulcy Fanny. Earleen [place], DO

## 2019-01-18 NOTE — Discharge Instructions (Signed)
Liver Biopsy, Care After °These instructions give you information about how to care for yourself after your procedure. Your health care provider may also give you more specific instructions. If you have problems or questions, contact your health care provider. °What can I expect after the procedure? °After your procedure, it is common to have: °· Pain and soreness in the area where the biopsy was done. °· Bruising around the area where the biopsy was done. °· Sleepiness and fatigue for 1-2 days. °Follow these instructions at home: °Medicines °· Take over-the-counter and prescription medicines only as told by your health care provider. °· If you were prescribed an antibiotic medicine, take it as told by your health care provider. Do not stop taking the antibiotic even if you start to feel better. °· Do not take medicines such as aspirin and ibuprofen unless your health care provider tells you to take them. These medicines thin your blood and can increase the risk of bleeding. °· If you are taking prescription pain medicine, take actions to prevent or treat constipation. Your health care provider may recommend that you: °? Drink enough fluid to keep your urine pale yellow. °? Eat foods that are high in fiber, such as fresh fruits and vegetables, whole grains, and beans. °? Limit foods that are high in fat and processed sugars, such as fried or sweet foods. °? Take an over-the-counter or prescription medicine for constipation. °Incision care °· Follow instructions from your health care provider about how to take care of your incision. Make sure you: °? Wash your hands with soap and water before you change your bandage (dressing). If soap and water are not available, use hand sanitizer. °? Change your dressing as told by your health care provider. °? Leave stitches (sutures), skin glue, or adhesive strips in place. These skin closures may need to stay in place for 2 weeks or longer. If adhesive strip edges start to  loosen and curl up, you may trim the loose edges. Do not remove adhesive strips completely unless your health care provider tells you to do that. °· Check your incision area every day for signs of infection. Check for: °? Redness, swelling, or pain. °? Fluid or blood. °? Warmth. °? Pus or a bad smell. °· Do not take baths, swim, or use a hot tub until your health care provider says it is okay to do so. °Activity ° °· Rest at home for 1-2 days, or as directed by your health care provider. °? Avoid sitting for a long time without moving. Get up to take short walks every 1-2 hours. This is important to improve blood flow and breathing. Ask for help if you feel weak or unsteady. °· Return to your normal activities as told by your health care provider. Ask your health care provider what activities are safe for you. °· Do not drive or use heavy machinery while taking prescription pain medicine. °· Do not lift anything that is heavier than 10 lb (4.5 kg), or the limit that your health care provider tells you, until he or she says that it is safe. °· Do not play contact sports for 2 weeks after the procedure. °General instructions ° °· Do not drink alcohol in the first week after the procedure. °· Have someone stay with you for at least 24 hours after the procedure. °· It is your responsibility to obtain your test results. Ask your health care provider, or the department that is doing the test: °? When will my   results be ready? °? How will I get my results? °? What are my treatment options? °? What other tests do I need? °? What are my next steps? °· Keep all follow-up visits as told by your health care provider. This is important. °Contact a health care provider if: °· You have increased bleeding from an incision, resulting in more than a small spot of blood. °· You have redness, swelling, or increasing pain in any incisions. °· You notice a discharge or a bad smell coming from any of your incisions. °· You have a fever or  chills. °Get help right away if: °· You develop swelling, bloating, or pain in your abdomen. °· You become dizzy or faint. °· You develop a rash. °· You have nausea or you vomit. °· You faint, or you have shortness of breath or difficulty breathing. °· You develop chest pain. °· You have problems with your speech or vision. °· You have trouble with your balance or moving your arms or legs. °Summary °· After the liver biopsy, it is common to have pain, soreness, and bruising in the area, as well as sleepiness and fatigue. °· Take over-the-counter and prescription medicines only as told by your health care provider. °· Follow instructions from your health care provider about how to care for your incision. Check the incision area daily for signs of infection. °This information is not intended to replace advice given to you by your health care provider. Make sure you discuss any questions you have with your health care provider. °Document Released: 12/31/2004 Document Revised: 08/06/2018 Document Reviewed: 06/23/2017 °Elsevier Patient Education © 2020 [employer] ° °

## 2019-01-18 NOTE — H&P (Signed)
Chief Complaint: Patient was seen in consultation today for liver lesion core biopsy at the request of Lucianne Lei Dam,Cornelius N  Referring Physician(s): Lucianne Lei Dam,Cornelius N  Supervising Physician: Corrie Mckusick  Patient Status: [facility] - Out-pt  History of Present Illness: [name] is a 53 y.o. female   First liver bx 05/30/18: Liver, needle/core biopsy - NECROTIZING GRANULOMATOUS INFLAMMATION. - NO EVIDENCE OF MALIGNANCY.  Second biopsy: requesting additional tests; 01/03/19: FINDINGS: One or two subtle hypoechoic lesions in right hepatic lobe near the right kidney. These areas were identified prior to prepping the patient and giving moderate sedation. After the patient was prepped and given moderate sedation, these areas could not be confidently identified. Therefore, liver biopsy was aborted. IMPRESSION: Aborted ultrasound-guided liver biopsy because lesions could not be adequately visualized with ultrasound.  Third request for additional sample-- to be done in CT for better visualization: Dr Tommy Medal note 01/10/19:  Per Dr. Anselm Pancoast do in [place].. Failed US Liver BX on 01/03/19.Marland KitchenMarland KitchenPatient should have liver biopsy biopsy specimen submitted in sterile SALINE and NOT FORMALIN to the MICROBIOLOGY labs so that we send speciment to [facility] for PCR testing and also possibly do AFB, fungal cultures in house. DO NOT SEND TO PATH. I would also want to make sure that proper papers accompnay her specimen and micro lab has heads up. If you let me know the date it will happen that will help  Now scheduled for additional samples of liver lesion- in pt with granulomatous hepatitis  Past Medical History:  Diagnosis Date  . Anemia   . Arthritis   . GERD (gastroesophageal reflux disease)   . 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 Irritable bowel syndrome   . Overweight(278.02)   . Personal history of colonic polyps 09/19/2011   tubular adenoma  . Segmental colitis [facility])     Past Surgical History:   Procedure Laterality Date  . COLONOSCOPY    . FACET JOINT INJECTION  03/28/11   injections in neck  . hysterectomy  2000  . KNEE SURGERY Right     Right knee arthroscopic partial lateral meniscectomy (01/22/07),arthroscopic partial lateral meniscectomy 5/09  . POLYPECTOMY    . SPINE SURGERY  10/13/06   Posterior interbody fusion (PLIF) L5-S1 (07/04/07)  . WRIST FRACTURE SURGERY Left 2011   Due to injury at work in 2008.    Allergies: Patient has no known allergies.  Medications: Prior to Admission medications   Medication Sig Start Date End Date Taking? Authorizing Provider  dicyclomine (BENTYL) 20 MG tablet TAKE 1 TABLET BY MOUTH THREE TIMES DAILY Patient taking differently: Take 20 mg by mouth 3 (three) times daily.  01/18/18  Yes Ladene Artist, MD  famotidine (PEPCID) 40 MG tablet Take 1 tablet (40 mg total) by mouth at bedtime. 10/31/18  Yes Ladene Artist, MD  hyoscyamine (LEVSIN SL) 0.125 MG SL tablet DISSOLVE 1 TABLET IN MOUTH EVERY 4 HOURS AS NEEDED Patient taking differently: Take 0.125 mg by mouth every 4 (four) hours as needed for cramping. DISSOLVE 1 TABLET IN MOUTH EVERY 4 HOURS AS NEEDED 01/11/19  Yes Ladene Artist, MD  methocarbamol (ROBAXIN) 750 MG tablet Take 750 mg by mouth 2 (two) times daily as needed for muscle spasms.   Yes [provider]  pantoprazole (PROTONIX) 40 MG tablet Take 1 tablet by mouth twice daily Patient taking differently: Take 40 mg by mouth 2 (two) times daily.  12/06/18  Yes Ladene Artist, MD  traMADol Veatrice Bourbon) 50  MG tablet Take 50 mg by mouth 3 (three) times daily as needed for moderate pain.    Yes [provider]  omeprazole (PRILOSEC) 20 MG capsule Take 20 mg by mouth daily.  09/08/11  [provider]     Family History  Problem Relation Age of Onset  . Asthma Mother   . Cancer Mother        gastric tumor  . Glaucoma Mother   . Hyperlipidemia Mother   . COPD Mother   . Stomach cancer Mother   .  Esophageal cancer Father        mets to lung and liver  . Hypertension Maternal Aunt   . Colon cancer Neg Hx   . Colon polyps Neg Hx   . Rectal cancer Neg Hx     Social History   Socioeconomic History  . Marital status: Divorced    Spouse name: Not on file  . Number of children: 2  . Years of education: Not on file  . Highest education level: Not on file  Occupational History  . Occupation: Disabled  Social Needs  . Financial resource strain: Not on file  . Food insecurity    Worry: Not on file    Inability: Not on file  . Transportation needs    Medical: Not on file    Non-medical: Not on file  Tobacco Use  . Smoking status: Never Smoker  . Smokeless tobacco: Never Used  Substance and Sexual Activity  . Alcohol use: No    Alcohol/week: 0.0 standard drinks  . Drug use: No  . Sexual activity: Yes  Lifestyle  . Physical activity    Days per week: Not on file    Minutes per session: Not on file  . Stress: Not on file  Relationships  . Social Herbalist on phone: Not on file    Gets together: Not on file    Attends religious service: Not on file    Active member of club or organization: Not on file    Attends meetings of clubs or organizations: Not on file    Relationship status: Not on file  Other Topics Concern  . Not on file  Social History Narrative   Regular exercise:  No   Caffeine Use:  Occasional sweet tea   She is legally separated   2 children ages 31 year old son and 76 year old daughter.  Both children.   Previously worked in Scient[employer], research (medical), now disabled.   Completed 2 yrs of college.       Review of Systems: A 12 point ROS discussed and pertinent positives are indicated in the HPI above.  All other systems are negative.  Review of Systems  Constitutional: Negative for activity change, fatigue and fever.  Respiratory: Negative for cough and shortness of breath.   Cardiovascular: Negative for chest pain.  Gastrointestinal: Negative for  abdominal pain.  Musculoskeletal: Negative for back pain.  Neurological: Negative for weakness.  Psychiatric/Behavioral: Negative for behavioral problems and confusion.    Vital Signs: BP (!) 152/80   Pulse 76   Temp 98.3 F (36.8 C) (Oral)   LMP 02/25/2009   SpO2 100%   Physical Exam Vitals signs reviewed.  Cardiovascular:     Rate and Rhythm: Normal rate and regular rhythm.     Heart sounds: Normal heart sounds.  Pulmonary:     Effort: Pulmonary effort is normal.     Breath sounds: Normal breath sounds.  Abdominal:  Tenderness: There is no abdominal tenderness.  Musculoskeletal: Normal range of motion.  Skin:    General: Skin is warm and dry.  Neurological:     Mental Status: She is alert and oriented to person, place, and time.  Psychiatric:        Mood and Affect: Mood normal.        Behavior: Behavior normal.        Thought Content: Thought content normal.        Judgment: Judgment normal.     Imaging: US Biopsy (liver)  Result Date: 01/03/2019 INDICATION: 53 year old with history of necrotizing granulomas. Patient had a prior biopsy on 05/30/2018 and request for another biopsy to sent for additional tests. EXAM: ATTEMPTED ULTRASOUND-GUIDED LIVER LESION BIOPSY. BIOPSY NOT PERFORMED. MEDICATIONS: None. ANESTHESIA/SEDATION: Fentanyl 100 mcg IV; Versed 2.0 mg IV Moderate Sedation Time:  20 minutes The patient was continuously monitored during the procedure by the interventional radiology nurse under my direct supervision. PROCEDURE: The procedure was explained to the patient. The risks and benefits of the procedure were discussed and the patient's questions were addressed. Informed consent was obtained from the patient. Liver was evaluated with ultrasound. Subtle hypoechoic lesions in the right hepatic lobe near the right kidney. These areas appear to correspond with the recent MRI findings. However, these lesions were very difficult to see particularly due to the  patient's respirations. Right abdomen was prepped and draped in sterile fashion. Skin was anesthetized with 1% lidocaine. After the patient received moderate sedation, the subtle lesions could not be confidently identified with ultrasound. 17 gauge coaxial needle was directed into subcutaneous tissues but did not enter the liver. Bandage placed over the puncture site. COMPLICATIONS: None immediate. FINDINGS: One or two subtle hypoechoic lesions in right hepatic lobe near the right kidney. These areas were identified prior to prepping the patient and giving moderate sedation. After the patient was prepped and given moderate sedation, these areas could not be confidently identified. Therefore, liver biopsy was aborted. IMPRESSION: Aborted ultrasound-guided liver biopsy because lesions could not be adequately visualized with ultrasound. Electronically Signed   By: Markus Daft M.D.   On: 01/03/2019 14:47    Labs:  CBC: Recent Labs    04/24/18 1423 05/30/18 0630 01/03/19 0612  WBC 7.6 10.4 7.4  HGB 11.5* 11.2* 11.5*  HCT 34.5* 36.4 35.2*  PLT 407.0* 343 426*    COAGS: Recent Labs    05/30/18 0630 01/03/19 0612  INR 1.07 1.0  APTT 31  --     BMP: Recent Labs    04/24/18 1423  NA 141  K 3.7  CL 103  CO2 31  GLUCOSE 106*  BUN 7  CALCIUM 9.3  CREATININE 0.76    LIVER FUNCTION TESTS: Recent Labs    04/24/18 1423  BILITOT 0.3  AST 20  ALT 13  ALKPHOS 84  PROT 7.5  ALBUMIN 3.8    TUMOR MARKERS: No results for input(s): AFPTM, CEA, CA199, CHROMGRNA in the last 8760 hours.  Assessment and Plan:  Granulomatous hepatitis Original biopsy 05/2018--- Needed additional samples-- attempted bx 01/03/19-- Dr Anselm Pancoast unable to visualize lesions well enough in [place] to safely perform bx Now scheduled in CT for biopsy Risks and benefits of liver biopsy was discussed with the patient and/or patient's family including, but not limited to bleeding, infection, damage to adjacent structures or  low yield requiring additional tests.  All of the questions were answered and there is agreement to proceed. Consent signed and in chart.  Thank you for this interesting consult.  I greatly enjoyed meeting [name] and look forward to participating in their care.  A copy of this report was sent to the requesting provider on this date.  Electronically Signed: Lavonia Drafts, PA-C 01/18/2019, 10:21 AM   I spent a total of    25 Minutes in face to face in clinical consultation, greater than 50% of which was counseling/coordinating care for liver biopsy

## 2019-01-19 LAB — ACID FAST SMEAR (AFB, MYCOBACTERIA): Acid Fast Smear: NEGATIVE

## 2019-01-29 ENCOUNTER — Telehealth: Payer: Self-pay | Admitting: Emergency Medicine

## 2019-01-29 NOTE — Telephone Encounter (Signed)
Returned call to patient.  Patient requesting refill on hydromet cough syrup.  States she still has dry, hacky cough and syrup helps.  No new symptoms "just same old cough' she had when at [place].  Informed patient we would route message to Dr. Lamonte Sakai for review and return call to her this week.  Patient acknowledged understanding.    Last refill of hycodan 11/29/18 T. Nils Pyle, NP  [place] 11/27/18 with Dr. Lamonte Sakai Over 1 month duration, unclear trigger.  She does remember having a URI or inciting event.  Suspect based on the description of her symptoms that this is being driven by esophageal reflux.  This would be the most common cause.  She did not benefit from cetirizine, empiric treatment for sinusitis.  Her GERD regimen was recently increased and I believe we should give this a chance to show whether she can improve.  Also discussed cough suppression, voice rest with her.  Given her history of hepatic granulomatous disease and the ongoing work-up certainly must consider other causes.  She is at risk for granulomatous lung disease.  An ACE level has been normal in the past, no clear evidence for cutaneous or pulmonary sarcoidosis but possible.  Certainly consider fungal disease (hepatic work-up is still ongoing).  Plan for PFT, CT chest, repeat ACE level.  If her pulmonary function testing is consistent with obstruction than would consider serum precipitans to fungal exposures.  Routed to Dr. Lamonte Sakai for review.  Patient scheduled for f/up and PFT 02/21/19.

## 2019-02-04 NOTE — Telephone Encounter (Signed)
Dr. Lamonte Sakai, please advise if you are okay sending refill of hydromet to pharmacy for pt. Thanks!

## 2019-02-05 MED ORDER — HYDROCODONE-HOMATROPINE 5-1.5 MG/5ML PO SYRP: 5.0000 mL | ORAL_SOLUTION | Freq: Four times a day (QID) | ORAL | 0 refills | Status: DC | PRN

## 2019-02-05 NOTE — Telephone Encounter (Signed)
Spoke with pt. She is aware of RB's response. Pt is scheduled for PFT and OV on 02/21/2019. Nothing further was needed at this time.

## 2019-02-05 NOTE — Telephone Encounter (Signed)
I called in a refill. She has an OV this month. Needs PFT's if they have not already been scheduled.

## 2019-02-06 ENCOUNTER — Other Ambulatory Visit: Payer: Self-pay | Admitting: Emergency Medicine

## 2019-02-06 DIAGNOSIS — M5032 Other cervical disc degeneration, mid-cervical region, unspecified level: Secondary | ICD-10-CM | POA: Diagnosis not present

## 2019-02-06 DIAGNOSIS — M542 Cervicalgia: Secondary | ICD-10-CM | POA: Diagnosis not present

## 2019-02-06 DIAGNOSIS — M791 Myalgia, unspecified site: Secondary | ICD-10-CM | POA: Diagnosis not present

## 2019-02-18 ENCOUNTER — Other Ambulatory Visit (HOSPITAL_COMMUNITY): Admission: RE | Admit: 2019-02-18 | Payer: Medicare HMO | Source: Ambulatory Visit

## 2019-02-19 ENCOUNTER — Other Ambulatory Visit: Payer: Self-pay

## 2019-02-19 ENCOUNTER — Encounter: Payer: Self-pay | Admitting: Infectious Disease

## 2019-02-19 ENCOUNTER — Ambulatory Visit (INDEPENDENT_AMBULATORY_CARE_PROVIDER_SITE_OTHER): Payer: Medicare HMO | Admitting: Infectious Disease

## 2019-02-19 ENCOUNTER — Other Ambulatory Visit (HOSPITAL_COMMUNITY)
Admission: RE | Admit: 2019-02-19 | Discharge: 2019-02-19 | Disposition: A | Discharge status: Home / Self Care | Payer: Medicare HMO | Source: Ambulatory Visit | Attending: Emergency Medicine | Admitting: Emergency Medicine

## 2019-02-19 DIAGNOSIS — K753 Granulomatous hepatitis, not elsewhere classified: Secondary | ICD-10-CM

## 2019-02-19 DIAGNOSIS — Z01812 Encounter for preprocedural laboratory examination: Secondary | ICD-10-CM | POA: Diagnosis not present

## 2019-02-19 DIAGNOSIS — K529 Noninfective gastroenteritis and colitis, unspecified: Secondary | ICD-10-CM | POA: Diagnosis not present

## 2019-02-19 DIAGNOSIS — Z20828 Contact with and (suspected) exposure to other viral communicable diseases: Secondary | ICD-10-CM | POA: Diagnosis not present

## 2019-02-19 DIAGNOSIS — R053 Chronic cough: Secondary | ICD-10-CM

## 2019-02-19 DIAGNOSIS — R05 Cough: Secondary | ICD-10-CM | POA: Diagnosis not present

## 2019-02-19 LAB — FUNGAL ORGANISM REFLEX

## 2019-02-19 LAB — FUNGUS CULTURE WITH STAIN

## 2019-02-19 LAB — FUNGUS CULTURE RESULT

## 2019-02-19 LAB — SARS CORONAVIRUS 2 (TAT 6-24 HRS): SARS Coronavirus 2: NEGATIVE

## 2019-02-19 NOTE — Progress Notes (Signed)
Virtual Visit via Video Note  I connected with [name] on 02/19/19 at  9:45 AM EDT by a video enabled telemedicine application and verified that I am speaking with the correct person using two identifiers.  Location: Patient: Home Provider: My Home   I discussed the limitations of evaluation and management by telemedicine and the availability of in person appointments. The patient expressed understanding and agreed to proceed.  History of Present Illness: 53 year old African-American woman who has been followed by Dr. Lucio Edward for presumed diagnosis of irritable bowel syndrome. Her symptoms have typically consisted of cramping abdominal pain occurs for 2 to 3 days before having multiple loose bowel movements she also at times will have diaphoresis and colonic spasm. She did have some hematochezia and had an unremarkable colonoscopy. She did have a CT scan that was performed in 2013 that was read as showing calcifications due to "old granulomatous disease" it also had shown some small liver lesions that at the time were thought to be hemangiomas. More recently she had a CT the abdomen pelvis that showed multiple low-density hyperenhancing lesions in the right and left lobe of the liver that were more pronounced and were concerning for possible hepatocellular carcinoma. MRI also further elucidated these lesions and again raise the suspicion for malignancy. She underwent a liver biopsy and this revealed no evidence of malignancy but necrotizing granulomatous inflammation. Gram stain AFB and fungal smears were negative on the specimen that was submitted in formalin. She had blood test performed with hepatology and these included a QuantiFERON gold that was negative a slightly elevated sed rate at 28 and ANA that was negative liver function test that were normal, Q fever antibodies that were negative actin smooth muscle antibodies that were negative ceruloplasmin which was negative  mitochondrial M2 antibody was negative negative hepatitis panel negative syphilis test negative HIV test and a histoplasma antibody that was that was positive for the yeast phase antibody at a titer of 1-16 but negative for the my seal fails antibody of less than 1-8. She was referred to our clinic for further evaluation and treatment. Have a normal angiotensin-converting enzyme level.  In clinic we tested her urine for histoplasma antigen and this was negative we sent serum for Blastomyces antigen and this was negative. We did a serum cryptococcal antigen which was negative. Rechecked an angiotensin-converting enzyme level and it was normal.  I had her tissue sent to [facility] for broad-based PCR testing. Note this tissue had been in formalin which compromises our ability to detect DNA in the tissue.  The PCR is done in order to wash in [place] were negative for any bacteria by broad-based PCR for bacterial pathogens, it was negative for fungi and nontuberculous mycobacteria by PCR testing. It was specifically negative for Mycobacterium tuberculosis by PCR testing.  Was also negative for the following organism specifically by PCR testing, Bartonella Coccidioides in mentis, histoplasma,Blastomyces, cryptococcus, toxoplasma,T. Whipplei.  She was having some symptoms of abdominal pain and diarrhea that is alleviated by been till.  She did also have night sweats which she had initially attributed to the bouts of GI complaints but that she is now wondering whether or not but they might be due to menopausal symptoms.  In the interim we have sent antibodies for Coccidioides which were negative.  I have repeated her histoplasma antibodies which were with a yeast antibody endomysial antibody of 1-8.  Since then she underwent repeat liver biopsy with specimen sent to  the microbiology lab for cultures and stains as well as [place] in [place] for PCR  testing.  Note this time the specimen was not sent in formalin.  Unfortunately I received a phone call from the lab at [facility].  Apparently the specimen was sent correctly was prepared correctly and processed correctly but when put into the machine for PCR testing apparently the specimen became destroyed and they were not able to run out of the test on it.  The data from our microbiology lab showed that she had an negative AFB smear and a negative fungal smear and no growth of organism on fungal culture was obtained after 4 weeks of intubation.  AFB cultures are still incubating 4 weeks and.  She now has developed a cough and has been seen Dr. Lamonte Sakai with with our pulmonary and scheduled for pulmonary function test this week.  I am getting more more to feel that this is more likely sarcoidosis rather than an infection with a dimorphic fungus.  No microorganisms have been seen on any of the fungal or AFB stains both from cultures and from the pathology stains.  All of the labs we have done with the exception of one antibody test to histoplasma which was only barely positive have been unrevealing PCR testing from formalin and activated specimen did not yield an organism either.  I think a therapeutic trial of corticosteroid might be the next best step to do in particular if she has pulmonary symptoms which might respond to this.  I am also open to a therapeutic trial with something like itraconazole to treat for histoplasmosis but I feel like that is pursuing the wrong likely culprit here.  I am also happy to review this case with Dr. perfect at [facility] who is at [employer]   Observations/Objective:  [name] continues to have some GI symptoms night sweats and now having cough that is gotten been going on for several months.  She is frustrated that all of the tests have come up negative so far.  I do think that this is consistent with it likely being sarcoid it  is unfortunate that the last specimen is sent to [place] was distorted machine as a PCR assay for all the microorganisms we wanted to look for would have been the most thorough thing we could possibly do.    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  If GI and pulmonary feel differently and feels still a fair amount of anxiety for an infection with something like histoplasmosis I am willing to try a therapeutic trial of itraconazole. I can also review case with Dr. Cassandria Santee at [facility].  Chronic cough: I think this could also fit with sarcoidosis she is going to see Dr. Wallene Dales had pulmonary function test performed on Thursday.  Follow Up Instructions:    I discussed the assessment and treatment plan with the patient. The patient was provided an opportunity to ask questions and all were answered. The patient agreed with the plan and demonstrated an understanding of the instructions.   The patient was advised to call back or seek an in-person evaluation if the symptoms worsen or if the condition fails to improve as anticipated.  I provided 22 minutes of non-face-to-face time during this encounter.   Alcide Evener, MD

## 2019-02-21 ENCOUNTER — Encounter: Payer: Self-pay | Admitting: Emergency Medicine

## 2019-02-21 ENCOUNTER — Ambulatory Visit: Payer: Medicare HMO | Admitting: Emergency Medicine

## 2019-02-21 ENCOUNTER — Ambulatory Visit (INDEPENDENT_AMBULATORY_CARE_PROVIDER_SITE_OTHER): Payer: Medicare HMO | Admitting: Emergency Medicine

## 2019-02-21 ENCOUNTER — Other Ambulatory Visit: Payer: Self-pay

## 2019-02-21 DIAGNOSIS — K753 Granulomatous hepatitis, not elsewhere classified: Secondary | ICD-10-CM

## 2019-02-21 DIAGNOSIS — R059 Cough, unspecified: Secondary | ICD-10-CM

## 2019-02-21 DIAGNOSIS — R05 Cough: Secondary | ICD-10-CM

## 2019-02-21 DIAGNOSIS — R053 Chronic cough: Secondary | ICD-10-CM

## 2019-02-21 DIAGNOSIS — K769 Liver disease, unspecified: Secondary | ICD-10-CM | POA: Diagnosis not present

## 2019-02-21 DIAGNOSIS — K7110 Toxic liver disease with hepatic necrosis, without coma: Secondary | ICD-10-CM | POA: Diagnosis not present

## 2019-02-21 LAB — PULMONARY FUNCTION TEST
DL/VA % pred: 97 %
DL/VA: 4.24 ml/min/mmHg/L
DLCO unc % pred: 85 %
DLCO unc: 17.35 ml/min/mmHg
FEF 25-75 Post: 2.98 L/sec
FEF 25-75 Pre: 3.18 L/sec
FEF2575-%Change-Post: -6 %
FEF2575-%Pred-Post: 127 %
FEF2575-%Pred-Pre: 135 %
FEV1-%Change-Post: -2 %
FEV1-%Pred-Post: 115 %
FEV1-%Pred-Pre: 118 %
FEV1-Post: 2.55 L
FEV1-Pre: 2.63 L
FEV1FVC-%Change-Post: 2 %
FEV1FVC-%Pred-Pre: 107 %
FEV6-%Change-Post: -4 %
FEV6-%Pred-Post: 106 %
FEV6-%Pred-Pre: 111 %
FEV6-Post: 2.86 L
FEV6-Pre: 3 L
FEV6FVC-%Change-Post: 0 %
FEV6FVC-%Pred-Post: 102 %
FEV6FVC-%Pred-Pre: 102 %
FVC-%Change-Post: -5 %
FVC-%Pred-Post: 103 %
FVC-%Pred-Pre: 109 %
FVC-Post: 2.86 L
FVC-Pre: 3.01 L
Post FEV1/FVC ratio: 89 %
Post FEV6/FVC ratio: 100 %
Pre FEV1/FVC ratio: 87 %
Pre FEV6/FVC Ratio: 100 %
RV % pred: 81 %
RV: 1.44 L
TLC % pred: 89 %
TLC: 4.37 L

## 2019-02-21 MED ORDER — PREDNISONE 10 MG PO TABS: ORAL_TABLET | ORAL | 0 refills | Status: DC

## 2019-02-21 NOTE — Assessment & Plan Note (Signed)
Normal PFT today.  Still most consistent with upper airway irritation syndrome.  She did get some relief with PPI but incomplete.  I would continue this.  She may get resolution on steroids no matter what the cause.  She denies significant rhinitis as a contributor.

## 2019-02-21 NOTE — Progress Notes (Signed)
PFT done today. 

## 2019-02-21 NOTE — Patient Instructions (Addendum)
Please take prednisone 30 mg daily for the next 3 weeks.  At that time decrease to 20 mg daily for 5 days, then to 10 mg daily for 5 days, then stop. We will repeat your CT scan of the chest and the abdomen in 1 month. Try your best to avoid throat clearing. Continue your Protonix 40 mg twice a day as you have been taking it. Follow with Dr Lamonte Sakai in 1 month after your CT scans are performed to review the results together.

## 2019-02-21 NOTE — Assessment & Plan Note (Signed)
The evaluation for possible fungal exposure, infection has been in-depth with no clear evidence for fungal process to date.  This certainly could be sarcoidosis.  Based on absence of skin involvement, pulmonary involvement, normal ACE level, suspect that if sarcoid is present then it is quiescent, inactive at this time.  It may be reasonable to treat her for possible active disease based on the presence of her hepatic lesions although unclear whether they have been changing significantly (there was 6 years between her interval CT scans).  She does have cough although I suspect that this is more upper airway in nature especially given her normal PFT.  After discussing thoroughly we decided to treat and follow clinically, imaging.  I will give her prednisone 30 mg for 3 weeks, then taper.  Plan for a repeat CT scan of the chest and abdomen at that time to see if there has been any interval change.

## 2019-02-21 NOTE — Addendum Note (Signed)
Addended by: Parke Poisson E on: 02/21/2019 01:01 PM   Modules accepted: Orders

## 2019-02-21 NOTE — Progress Notes (Signed)
Subjective:    Patient ID: [name], female    DOB: [birth year], 53 y.o.   MRN: [id number]  HPI 53 year old overweight never smoker with a history of irritable bowel syndrome, GERD, history of granulomatous liver disease that was biopsied and showed necrotizing granulomatous findings with negative AFB, fungal smears and cultures (but done in formalin).  ACE level normal.  She is been seen in gastroenterology and also infectious diseases clinic for this. Another liver biopsy is planned to facilitate culture data, planning for abdominal MRI saturday  She is referred today to evaluate cough.  She tells me that this started about 1 - 2 months ago. Didn't seem to be precipitated by URI, etc. Was initially productive of some light green / white mucous, now dry. Was trialed on cetirizine + Augmentin (possible sinusitis) + mucinex, tessalon perles.  She feels reflux, has chest discomfort substernal. An EGD 08/2018 was normal.  Her pantoprazole was increased 10/31/2018 20 to 40 mg twice daily and famotidine was added by Dr Fuller Plan. Denies any current nasal congestion or HA. She does not wheeze. Cough can wake her from sleep, can be worse at night.   She has not had a dedicated CT chest.  Chest x-ray from 4/24 reviewed and shows no infiltrates or abnormality that would explain cough.   She does not snore, does have daytime sleepiness.   ROV 02/21/2019 --interesting case of 53 year old woman, never smoker with IBS and history of granulomatous liver disease by biopsy with necrotizing granulomatous findings (culture negative but in formalin).  Normal ACE level, normal antibody/autoimmune evaluation.  Repeat liver biopsy was performed 7/24 but again PCR testing was unsuccessful as per Dr. Arlyss Queen note from 8/25.  AFB and fungal smears negative, culture still pending.  I have seen her for cough in the setting of GERD.  Given the suspicion regarding possible sarcoidosis versus other granulomatous process we checked  pulmonary function testing today.  I have reviewed.  This shows normal airflows without a bronchodilator response, normal lung volumes and diffusion capacity.  CT chest from 12/12/2018 showed some partially calcified mediastinal lymphadenopathy, not significantly enlarged, with some left lower lobe subpleural peripheral nodularity of unclear cause.   Remains on PPI bid. Reports that her cough is    Review of Systems  Constitutional: Negative for fever and unexpected weight change.  HENT: Negative for congestion, dental problem, ear pain, nosebleeds, postnasal drip, rhinorrhea, sinus pressure, sneezing, sore throat and trouble swallowing.   Eyes: Negative for redness and itching.  Respiratory: Positive for cough. Negative for chest tightness, shortness of breath and wheezing.   Cardiovascular: Negative for palpitations and leg swelling.  Gastrointestinal: Negative for nausea and vomiting.  Genitourinary: Negative for dysuria.  Musculoskeletal: Negative for joint swelling.  Skin: Negative for rash.  Neurological: Negative for headaches.  Hematological: Does not bruise/bleed easily.  Psychiatric/Behavioral: Negative for dysphoric mood. The patient is not nervous/anxious.     Past Medical History:  Diagnosis Date  . Anemia   . Arthritis   . GERD (gastroesophageal reflux disease)   . 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 Irritable bowel syndrome   . Overweight(278.02)   . Personal history of colonic polyps 09/19/2011   tubular adenoma  . Segmental colitis ([place])      Family History  Problem Relation Age of Onset  . Asthma Mother   . Cancer Mother        gastric tumor  . Glaucoma Mother   . Hyperlipidemia Mother   . COPD  Mother   . Stomach cancer Mother   . Esophageal cancer Father        mets to lung and liver  . Hypertension Maternal Aunt   . Colon cancer Neg Hx   . Colon polyps Neg Hx   . Rectal cancer Neg Hx     No hx sarcoidosis in family.   Social History   Socioeconomic  History  . Marital status: Divorced    Spouse name: Not on file  . Number of children: 2  . Years of education: Not on file  . Highest education level: Not on file  Occupational History  . Occupation: Disabled  Social Needs  . Financial resource strain: Not on file  . Food insecurity    Worry: Not on file    Inability: Not on file  . Transportation needs    Medical: Not on file    Non-medical: Not on file  Tobacco Use  . Smoking status: Never Smoker  . Smokeless tobacco: Never Used  Substance and Sexual Activity  . Alcohol use: No    Alcohol/week: 0.0 standard drinks  . Drug use: No  . Sexual activity: Yes  Lifestyle  . Physical activity    Days per week: Not on file    Minutes per session: Not on file  . Stress: Not on file  Relationships  . Social Herbalist on phone: Not on file    Gets together: Not on file    Attends religious service: Not on file    Active member of club or organization: Not on file    Attends meetings of clubs or organizations: Not on file    Relationship status: Not on file  . Intimate partner violence    Fear of current or ex partner: Not on file    Emotionally abused: Not on file    Physically abused: Not on file    Forced sexual activity: Not on file  Other Topics Concern  . Not on file  Social History Narrative   Regular exercise:  No   Caffeine Use:  Occasional sweet tea   She is legally separated   2 children ages 28 year old son and 54 year old daughter.  Both children.   Previously worked in Scient[employer], research (medical), now disabled.   Completed 2 yrs of college.       No Known Allergies   Outpatient Medications Prior to Visit  Medication Sig Dispense Refill  . dicyclomine (BENTYL) 20 MG tablet TAKE 1 TABLET BY MOUTH THREE TIMES DAILY (Patient taking differently: Take 20 mg by mouth 3 (three) times daily. ) 90 tablet 11  . famotidine (PEPCID) 40 MG tablet Take 1 tablet (40 mg total) by mouth at bedtime. 30 tablet 11  .  HYDROcodone-homatropine (HYCODAN) 5-1.5 MG/5ML syrup Take 5 mLs by mouth every 6 (six) hours as needed for cough. 240 mL 0  . hyoscyamine (LEVSIN SL) 0.125 MG SL tablet DISSOLVE 1 TABLET IN MOUTH EVERY 4 HOURS AS NEEDED (Patient taking differently: Take 0.125 mg by mouth every 4 (four) hours as needed for cramping. DISSOLVE 1 TABLET IN MOUTH EVERY 4 HOURS AS NEEDED) 30 tablet 0  . methocarbamol (ROBAXIN) 750 MG tablet Take 750 mg by mouth 2 (two) times daily as needed for muscle spasms.    . pantoprazole (PROTONIX) 40 MG tablet Take 1 tablet by mouth twice daily (Patient taking differently: Take 40 mg by mouth 2 (two) times daily. ) 60 tablet 2  .  traMADol (ULTRAM) 50 MG tablet Take 50 mg by mouth 3 (three) times daily as needed for moderate pain.      No facility-administered medications prior to visit.         Objective:   Physical Exam Vitals:   02/21/19 1207  BP: 120/80  Pulse: 80  SpO2: 99%  Weight: 196 lb (88.9 kg)  Height: 5\' 3"  (1.6 m)   Gen: Pleasant, overwt woman, in no distress,  normal affect  ENT: No lesions,  mouth clear,  oropharynx clear, crowded post pharynx, no postnasal drip  Neck: No JVD, no stridor  Lungs: No use of accessory muscles, no crackles or wheezing on normal respiration, no wheeze on forced expiration  Cardiovascular: RRR, heart sounds normal, no murmur or gallops, no peripheral edema  Musculoskeletal: No deformities, no cyanosis or clubbing  Neuro: alert, awake, non focal  Skin: Warm, no lesions or rash      Assessment & Plan:  Granuloma of liver The evaluation for possible fungal exposure, infection has been in-depth with no clear evidence for fungal process to date.  This certainly could be sarcoidosis.  Based on absence of skin involvement, pulmonary involvement, normal ACE level, suspect that if sarcoid is present then it is quiescent, inactive at this time.  It may be reasonable to treat her for possible active disease based on the  presence of her hepatic lesions although unclear whether they have been changing significantly (there was 6 years between her interval CT scans).  She does have cough although I suspect that this is more upper airway in nature especially given her normal PFT.  After discussing thoroughly we decided to treat and follow clinically, imaging.  I will give her prednisone 30 mg for 3 weeks, then taper.  Plan for a repeat CT scan of the chest and abdomen at that time to see if there has been any interval change.  Chronic cough Normal PFT today.  Still most consistent with upper airway irritation syndrome.  She did get some relief with PPI but incomplete.  I would continue this.  She may get resolution on steroids no matter what the cause.  She denies significant rhinitis as a contributor.   Baltazar Apo, MD, PhD 02/21/2019, 12:37 PM [facility] [phone] or if no answer [phone]

## 2019-02-24 ENCOUNTER — Other Ambulatory Visit: Payer: Self-pay | Admitting: Gastroenterology

## 2019-02-28 ENCOUNTER — Other Ambulatory Visit: Payer: Self-pay | Admitting: Gastroenterology

## 2019-03-02 LAB — ACID FAST CULTURE WITH REFLEXED SENSITIVITIES (MYCOBACTERIA): Acid Fast Culture: NEGATIVE

## 2019-03-07 ENCOUNTER — Ambulatory Visit (INDEPENDENT_AMBULATORY_CARE_PROVIDER_SITE_OTHER): Payer: Medicare HMO | Admitting: Gastroenterology

## 2019-03-07 ENCOUNTER — Ambulatory Visit (HOSPITAL_COMMUNITY)
Admission: RE | Admit: 2019-03-07 | Discharge: 2019-03-07 | Disposition: A | Discharge status: Home / Self Care | Payer: Medicare HMO | Source: Ambulatory Visit | Attending: Gastroenterology | Admitting: Gastroenterology

## 2019-03-07 ENCOUNTER — Encounter: Payer: Self-pay | Admitting: *Deleted

## 2019-03-07 ENCOUNTER — Other Ambulatory Visit (INDEPENDENT_AMBULATORY_CARE_PROVIDER_SITE_OTHER): Payer: Medicare HMO

## 2019-03-07 ENCOUNTER — Other Ambulatory Visit: Payer: Medicare HMO

## 2019-03-07 ENCOUNTER — Telehealth: Payer: Self-pay | Admitting: Gastroenterology

## 2019-03-07 ENCOUNTER — Other Ambulatory Visit: Payer: Self-pay

## 2019-03-07 VITALS — BP 124/76 | HR 98 | Temp 98.7°F | Ht 63.0 in | Wt 194.0 lb

## 2019-03-07 DIAGNOSIS — R1011 Right upper quadrant pain: Secondary | ICD-10-CM

## 2019-03-07 LAB — COMPREHENSIVE METABOLIC PANEL
ALT: 9 U/L (ref 0–35)
AST: 12 U/L (ref 0–37)
Albumin: 3.9 g/dL (ref 3.5–5.2)
Alkaline Phosphatase: 94 U/L (ref 39–117)
BUN: 9 mg/dL (ref 6–23)
CO2: 34 mEq/L — ABNORMAL HIGH (ref 19–32)
Calcium: 9.3 mg/dL (ref 8.4–10.5)
Chloride: 101 mEq/L (ref 96–112)
Creatinine, Ser: 0.79 mg/dL (ref 0.40–1.20)
GFR: 92.68 mL/min (ref >60.00)
Glucose, Bld: 100 mg/dL — ABNORMAL HIGH (ref 70–99)
Potassium: 3.5 mEq/L (ref 3.5–5.1)
Sodium: 140 mEq/L (ref 135–145)
Total Bilirubin: 0.4 mg/dL (ref 0.2–1.2)
Total Protein: 7.7 g/dL (ref 6.0–8.3)

## 2019-03-07 LAB — CBC WITH DIFFERENTIAL/PLATELET
Basophils Absolute: 0.1 10*3/uL (ref 0.0–0.1)
Basophils Relative: 0.6 % (ref 0.0–3.0)
Eosinophils Absolute: 0.2 10*3/uL (ref 0.0–0.7)
Eosinophils Relative: 2.1 % (ref 0.0–5.0)
HCT: 36 % (ref 36.0–46.0)
Hemoglobin: 12 g/dL (ref 12.0–15.0)
Lymphocytes Relative: 21.2 % (ref 12.0–46.0)
Lymphs Abs: 2.3 10*3/uL (ref 0.7–4.0)
MCHC: 33.4 g/dL (ref 30.0–36.0)
MCV: 86.6 fl (ref 78.0–100.0)
Monocytes Absolute: 1 10*3/uL (ref 0.1–1.0)
Monocytes Relative: 9 % (ref 3.0–12.0)
Neutro Abs: 7.1 10*3/uL (ref 1.4–7.7)
Neutrophils Relative %: 67.1 % (ref 43.0–77.0)
Platelets: 407 10*3/uL — ABNORMAL HIGH (ref 150.0–400.0)
RBC: 4.16 Mil/uL (ref 3.87–5.11)
RDW: 13.9 % (ref 11.5–15.5)
WBC: 10.6 10*3/uL — ABNORMAL HIGH (ref 4.0–10.5)

## 2019-03-07 NOTE — Progress Notes (Signed)
03/07/2019 [name] [id number] 1965-11-08   HISTORY OF PRESENT ILLNESS: This is a 53 year old female who is a patient of Dr. Lynne Leader.  She presents to our office today as an acute add-on patient to my schedule this morning for complaints of sudden onset right upper quadrant abdominal pain.  She says that this began last night while she was lying in bed.  Kept her from sleeping last night.  Describes it as a throbbing type pain.  She tried using the heating pad and that did seem to ease it up to some degree.  Has been taking her Bentyl regularly and he even tried some Levsin without much relief.  She says that it reaches an 7 or 8 out of 10 on the pain scale at its worse intensity.  Never had pain like this in the past.   Past Medical History:  Diagnosis Date  . Anemia   . Arthritis   . GERD (gastroesophageal reflux disease)   . 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 Irritable bowel syndrome   . Overweight(278.02)   . Personal history of colonic polyps 09/19/2011   tubular adenoma  . Segmental colitis ([place])   . Tubular adenoma of colon    Past Surgical History:  Procedure Laterality Date  . COLONOSCOPY    . FACET JOINT INJECTION  03/28/11   injections in neck  . hysterectomy  2000  . KNEE SURGERY Right     Right knee arthroscopic partial lateral meniscectomy (01/22/07),arthroscopic partial lateral meniscectomy 5/09  . POLYPECTOMY    . SPINE SURGERY  10/13/06   Posterior interbody fusion (PLIF) L5-S1 (07/04/07)  . WRIST FRACTURE SURGERY Left 2011   Due to injury at work in 2008.    reports that she has never smoked. She has never used smokeless tobacco. She reports that she does not drink alcohol or use drugs. family history includes Asthma in her mother; COPD in her mother; Cancer in her mother; Esophageal cancer in her father; Glaucoma in her mother; Hyperlipidemia in her mother; Hypertension in her maternal aunt; Stomach cancer in her mother. No Known Allergies    Outpatient  Encounter Medications as of 03/07/2019  Medication Sig  . dicyclomine (BENTYL) 20 MG tablet TAKE 1 TABLET BY MOUTH THREE TIMES DAILY  . famotidine (PEPCID) 40 MG tablet Take 1 tablet (40 mg total) by mouth at bedtime.  Marland Kitchen HYDROcodone-homatropine (HYCODAN) 5-1.5 MG/5ML syrup Take 5 mLs by mouth every 6 (six) hours as needed for cough.  . hyoscyamine (LEVSIN SL) 0.125 MG SL tablet DISSOLVE 1 TABLET IN MOUTH EVERY 4 HOURS AS NEEDED (Patient taking differently: Take 0.125 mg by mouth every 4 (four) hours as needed for cramping. DISSOLVE 1 TABLET IN MOUTH EVERY 4 HOURS AS NEEDED)  . methocarbamol (ROBAXIN) 750 MG tablet Take 750 mg by mouth 2 (two) times daily as needed for muscle spasms.  . pantoprazole (PROTONIX) 40 MG tablet Take 1 tablet by mouth twice daily  . predniSONE (DELTASONE) 10 MG tablet 30mg  daily for the next 3 weeks.  At that time decrease to 20mg  daily for 5 days, then to 10mg  daily for 5 days, then stop.  . traMADol (ULTRAM) 50 MG tablet Take 50 mg by mouth 3 (three) times daily as needed for moderate pain.   . [DISCONTINUED] omeprazole (PRILOSEC) 20 MG capsule Take 20 mg by mouth daily.   No facility-administered encounter medications on file as of 03/07/2019.      REVIEW OF SYSTEMS  :  All other systems reviewed and negative except where noted in the History of Present Illness.   PHYSICAL EXAM: BP 124/76   Pulse 98   Temp 98.7 F (37.1 C)   Ht 5\' 3"  (1.6 m)   Wt 194 lb (88 kg)   LMP 02/25/2009   BMI 34.37 kg/m  General: Well developed black female in no acute distress Head: Normocephalic and atraumatic Eyes:  Sclerae anicteric, conjunctiva pink. Ears: Normal auditory acuity Lungs: Clear throughout to auscultation; no increased WOB. Heart: Regular rate and rhythm; no M/R/G. Abdomen: Soft, non-distended.  BS present.  RUQ TTP. Musculoskeletal: Symmetrical with no gross deformities  Skin: No lesions on visible extremities Extremities: No edema  Neurological: Alert  oriented x 4, grossly non-focal Psychological:  Alert and cooperative. Normal mood and affect  ASSESSMENT AND PLAN: *RUQ abdominal pain:  Acute onset.  Started last night.  Will check labs including LFT's and CBC.  Will see if we can get an abdominal ultrasound today.  Rule out gallbladder pathology.   CC:  Rankins, Bill Salinas, MD

## 2019-03-07 NOTE — Patient Instructions (Signed)
You have been scheduled for a limited abdominal ultrasound at [facility] (1st floor of hospital) on TODAY at 3:00 pm. Please arrive 15 minutes prior to your appointment for registration. Make certain not to have anything to eat or drink 6 hours prior to your appointment. Should you need to reschedule your appointment, please contact radiology at [phone]. This test typically takes about 30 minutes to perform.  Your provider has requested that you go to the basement level for lab work before leaving today. Press "B" on the elevator. The lab is located at the first door on the left as you exit the elevator.  If you are age 2 or older, your body mass index should be between 23-30. Your Body mass index is 34.37 kg/m. If this is out of the aforementioned range listed, please consider follow up with your Primary Care Provider.  If you are age 51 or younger, your body mass index should be between 19-25. Your Body mass index is 34.37 kg/m. If this is out of the aformentioned range listed, please consider follow up with your Primary Care Provider.

## 2019-03-07 NOTE — Telephone Encounter (Signed)
Patient reports sudden onset of RLQ pain and cramping.  She has been taking the dicyclomine TID and it is not helping at all.  She reports that her pain is worse when she gets up to walk. She will come in and see Alonza Bogus, PA at 10:00

## 2019-03-08 NOTE — Progress Notes (Signed)
Reviewed and agree with management plan.  Zharia Conrow T. Fritz Cauthon, MD FACG [facility]  

## 2019-03-12 ENCOUNTER — Other Ambulatory Visit: Payer: Self-pay

## 2019-03-12 ENCOUNTER — Ambulatory Visit (HOSPITAL_COMMUNITY)
Admission: RE | Admit: 2019-03-12 | Discharge: 2019-03-12 | Disposition: A | Discharge status: Home / Self Care | Payer: Medicare HMO | Source: Ambulatory Visit | Attending: Emergency Medicine | Admitting: Emergency Medicine

## 2019-03-12 ENCOUNTER — Encounter (HOSPITAL_COMMUNITY): Payer: Self-pay

## 2019-03-12 DIAGNOSIS — R059 Cough, unspecified: Secondary | ICD-10-CM

## 2019-03-12 DIAGNOSIS — K769 Liver disease, unspecified: Secondary | ICD-10-CM | POA: Insufficient documentation

## 2019-03-12 DIAGNOSIS — R05 Cough: Secondary | ICD-10-CM | POA: Diagnosis not present

## 2019-03-12 DIAGNOSIS — K7689 Other specified diseases of liver: Secondary | ICD-10-CM | POA: Diagnosis not present

## 2019-03-12 MED ORDER — GADOXETATE DISODIUM 0.25 MMOL/ML IV SOLN
10.0000 mL | Freq: Once | INTRAVENOUS | Status: AC | PRN
  Administered 2019-03-12: 8 mL via INTRAVENOUS

## 2019-03-19 ENCOUNTER — Encounter: Payer: Self-pay | Admitting: Emergency Medicine

## 2019-03-19 ENCOUNTER — Other Ambulatory Visit: Payer: Self-pay | Admitting: Gastroenterology

## 2019-03-19 ENCOUNTER — Other Ambulatory Visit: Payer: Self-pay

## 2019-03-19 ENCOUNTER — Ambulatory Visit: Payer: Medicare HMO | Admitting: Emergency Medicine

## 2019-03-19 DIAGNOSIS — K753 Granulomatous hepatitis, not elsewhere classified: Secondary | ICD-10-CM

## 2019-03-19 DIAGNOSIS — D869 Sarcoidosis, unspecified: Secondary | ICD-10-CM

## 2019-03-19 NOTE — Assessment & Plan Note (Signed)
At this point given the waxing and waning findings on MRI abdomen, negative AFB and fungal, necrotizing granulomas on pathology, suspect that this was sarcoidosis.  I treat her empirically for 3 weeks with prednisone.  Her cough may have improved some her imaging is unaffected.  I suspect that she is in remission.  We will plan to repeat your CT scan of the chest and your MRI of the abdomen in September 2021. Please let us know if you have any changes in your cough, breathing, any new rash.  If so then we should see you in office and determine whether to repeat any of your scans sooner. Follow with Dr Lamonte Sakai in 6 months or sooner if you have any problems

## 2019-03-19 NOTE — Progress Notes (Signed)
Subjective:    Patient ID: [name], female    DOB: 04-19-1966, 53 y.o.   MRN: [id number]  HPI 53 year old overweight never smoker with a history of irritable bowel syndrome, GERD, history of granulomatous liver disease that was biopsied and showed necrotizing granulomatous findings with negative AFB, fungal smears and cultures (but done in formalin).  ACE level normal.  She is been seen in gastroenterology and also infectious diseases clinic for this. Another liver biopsy is planned to facilitate culture data, planning for abdominal MRI saturday  She is referred today to evaluate cough.  She tells me that this started about 1 - 2 months ago. Didn't seem to be precipitated by URI, etc. Was initially productive of some light green / white mucous, now dry. Was trialed on cetirizine + Augmentin (possible sinusitis) + mucinex, tessalon perles.  She feels reflux, has chest discomfort substernal. An EGD 08/2018 was normal.  Her pantoprazole was increased 10/31/2018 20 to 40 mg twice daily and famotidine was added by Dr Fuller Plan. Denies any current nasal congestion or HA. She does not wheeze. Cough can wake her from sleep, can be worse at night.   She has not had a dedicated CT chest.  Chest x-ray from 4/24 reviewed and shows no infiltrates or abnormality that would explain cough.   She does not snore, does have daytime sleepiness.   ROV 02/21/2019 --interesting case of 53 year old woman, never smoker with IBS and history of granulomatous liver disease by biopsy with necrotizing granulomatous findings (culture negative but in formalin).  Normal ACE level, normal antibody/autoimmune evaluation.  Repeat liver biopsy was performed 7/24 but again PCR testing was unsuccessful as per Dr. Arlyss Queen note from 8/25.  AFB and fungal smears negative, culture still pending.  I have seen her for cough in the setting of GERD.  Given the suspicion regarding possible sarcoidosis versus other granulomatous process we checked  pulmonary function testing today.  I have reviewed.  This shows normal airflows without a bronchodilator response, normal lung volumes and diffusion capacity.  CT chest from 12/12/2018 showed some partially calcified mediastinal lymphadenopathy, not significantly enlarged, with some left lower lobe subpleural peripheral nodularity of unclear cause.   Remains on PPI bid.   ROV 03/19/2019 --this a follow-up visit for 53 year old woman with history of IBS, granulomatous disease initially diagnosed on liver biopsy with necrotizing granulomas.  She has no obstruction on pulmonary function testing.  We have considered the likelihood that this is sarcoidosis and based on that I treated her with prednisone 30 mg for 3 weeks.  She subsequently underwent CT scan of the chest and MRI abdomen that I reviewed.  Again she had some subpleural left lower lobe nodularity in the left major fissure, unchanged.  The MRI abdomen shows persistent multiple T2 hyperintense hypervascular liver lesions, some larger and some smaller.  The spleen also has multiple lesions suggestive of granulomatous disease.  Repeat liver biopsy 7/24 AFB smear and culture negative, PCR not done.  Fungal negative. She has tolerated the pred but w some side effects, sweats, Gi upset.    Review of Systems  Constitutional: Negative for fever and unexpected weight change.  HENT: Negative for congestion, dental problem, ear pain, nosebleeds, postnasal drip, rhinorrhea, sinus pressure, sneezing, sore throat and trouble swallowing.   Eyes: Negative for redness and itching.  Respiratory: Positive for cough. Negative for chest tightness, shortness of breath and wheezing.   Cardiovascular: Negative for palpitations and leg swelling.  Gastrointestinal: Negative for nausea and  vomiting.  Genitourinary: Negative for dysuria.  Musculoskeletal: Negative for joint swelling.  Skin: Negative for rash.  Neurological: Negative for headaches.  Hematological: Does  not bruise/bleed easily.  Psychiatric/Behavioral: Negative for dysphoric mood. The patient is not nervous/anxious.     Past Medical History:  Diagnosis Date  . Anemia   . Arthritis   . GERD (gastroesophageal reflux disease)   . 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 Irritable bowel syndrome   . Overweight(278.02)   . Personal history of colonic polyps 09/19/2011   tubular adenoma  . Segmental colitis ([place])   . Tubular adenoma of colon      Family History  Problem Relation Age of Onset  . Asthma Mother   . Cancer Mother        gastric tumor  . Glaucoma Mother   . Hyperlipidemia Mother   . COPD Mother   . Stomach cancer Mother   . Esophageal cancer Father        mets to lung and liver  . Hypertension Maternal Aunt   . Colon cancer Neg Hx   . Colon polyps Neg Hx   . Rectal cancer Neg Hx     No hx sarcoidosis in family.   Social History   Socioeconomic History  . Marital status: Divorced    Spouse name: Not on file  . Number of children: 2  . Years of education: Not on file  . Highest education level: Not on file  Occupational History  . Occupation: Disabled  Social Needs  . Financial resource strain: Not on file  . Food insecurity    Worry: Not on file    Inability: Not on file  . Transportation needs    Medical: Not on file    Non-medical: Not on file  Tobacco Use  . Smoking status: Never Smoker  . Smokeless tobacco: Never Used  Substance and Sexual Activity  . Alcohol use: No    Alcohol/week: 0.0 standard drinks  . Drug use: No  . Sexual activity: Yes  Lifestyle  . Physical activity    Days per week: Not on file    Minutes per session: Not on file  . Stress: Not on file  Relationships  . Social Herbalist on phone: Not on file    Gets together: Not on file    Attends religious service: Not on file    Active member of club or organization: Not on file    Attends meetings of clubs or organizations: Not on file    Relationship status: Not on  file  . Intimate partner violence    Fear of current or ex partner: Not on file    Emotionally abused: Not on file    Physically abused: Not on file    Forced sexual activity: Not on file  Other Topics Concern  . Not on file  Social History Narrative   Regular exercise:  No   Caffeine Use:  Occasional sweet tea   She is legally separated   2 children ages 19 year old son and 46 year old daughter.  Both children.   Previously worked in [employer], research (medical), now disabled.   Completed 2 yrs of college.       No Known Allergies   Outpatient Medications Prior to Visit  Medication Sig Dispense Refill  . dicyclomine (BENTYL) 20 MG tablet TAKE 1 TABLET BY MOUTH THREE TIMES DAILY 90 tablet 8  . famotidine (PEPCID) 40 MG tablet Take 1 tablet (40  mg total) by mouth at bedtime. 30 tablet 11  . HYDROcodone-homatropine (HYCODAN) 5-1.5 MG/5ML syrup Take 5 mLs by mouth every 6 (six) hours as needed for cough. 240 mL 0  . hyoscyamine (LEVSIN SL) 0.125 MG SL tablet DISSOLVE 1 TABLET IN MOUTH EVERY 4 HOURS AS NEEDED 30 tablet 5  . methocarbamol (ROBAXIN) 750 MG tablet Take 750 mg by mouth 2 (two) times daily as needed for muscle spasms.    . pantoprazole (PROTONIX) 40 MG tablet Take 1 tablet by mouth twice daily 60 tablet 0  . predniSONE (DELTASONE) 10 MG tablet 30mg  daily for the next 3 weeks.  At that time decrease to 20mg  daily for 5 days, then to 10mg  daily for 5 days, then stop. 78 tablet 0  . traMADol (ULTRAM) 50 MG tablet Take 50 mg by mouth 3 (three) times daily as needed for moderate pain.      No facility-administered medications prior to visit.         Objective:   Physical Exam Vitals:   03/19/19 0938  BP: 134/82  Pulse: 84  SpO2: 94%  Weight: 194 lb (88 kg)  Height: 5\' 3"  (1.6 m)   Gen: Pleasant, overwt woman, in no distress,  normal affect, some throat clearing  ENT: No lesions,  mouth clear,  oropharynx clear, crowded post pharynx, no postnasal drip  Neck: No JVD, no stridor  Lungs:  No use of accessory muscles, no crackles or wheezing on normal respiration, no wheeze on forced expiration  Cardiovascular: RRR, heart sounds normal, no murmur or gallops, no peripheral edema  Musculoskeletal: No deformities, no cyanosis or clubbing  Neuro: alert, awake, non focal  Skin: Warm, no lesions or rash      Assessment & Plan:  Granuloma of liver At this point given the waxing and waning findings on MRI abdomen, negative AFB and fungal, necrotizing granulomas on pathology, suspect that this was sarcoidosis.  I treat her empirically for 3 weeks with prednisone.  Her cough may have improved some her imaging is unaffected.  I suspect that she is in remission.  We will plan to repeat your CT scan of the chest and your MRI of the abdomen in September 2021. Please let us know if you have any changes in your cough, breathing, any new rash.  If so then we should see you in office and determine whether to repeat any of your scans sooner. Follow with Dr Lamonte Sakai in 6 months or sooner if you have any problems   Baltazar Apo, MD, PhD 03/19/2019, 10:15 AM [facility] [phone] or if no answer [phone]

## 2019-03-19 NOTE — Patient Instructions (Signed)
We will plan to repeat your CT scan of the chest and your MRI of the abdomen in September 2021. Please let us know if you have any changes in your cough, breathing, any new rash.  If so then we should see you in office and determine whether to repeat any of your scans sooner. Follow with Dr Lamonte Sakai in 6 months or sooner if you have any problems

## 2019-03-20 ENCOUNTER — Encounter: Payer: Self-pay | Admitting: Infectious Disease

## 2019-03-20 ENCOUNTER — Ambulatory Visit (INDEPENDENT_AMBULATORY_CARE_PROVIDER_SITE_OTHER): Payer: Medicare HMO | Admitting: Infectious Disease

## 2019-03-20 VITALS — BP 127/74 | HR 85 | Temp 98.5°F

## 2019-03-20 DIAGNOSIS — K753 Granulomatous hepatitis, not elsewhere classified: Secondary | ICD-10-CM | POA: Diagnosis not present

## 2019-03-20 DIAGNOSIS — R05 Cough: Secondary | ICD-10-CM

## 2019-03-20 DIAGNOSIS — D869 Sarcoidosis, unspecified: Secondary | ICD-10-CM

## 2019-03-20 DIAGNOSIS — Z23 Encounter for immunization: Secondary | ICD-10-CM

## 2019-03-20 DIAGNOSIS — R1011 Right upper quadrant pain: Secondary | ICD-10-CM | POA: Diagnosis not present

## 2019-03-20 DIAGNOSIS — R053 Chronic cough: Secondary | ICD-10-CM

## 2019-03-20 HISTORY — DX: Sarcoidosis, unspecified: D86.9

## 2019-03-20 MED ORDER — PREDNISONE 20 MG PO TABS: 30.0000 mg | ORAL_TABLET | Freq: Every day | ORAL | 1 refills | Status: DC

## 2019-03-20 NOTE — Progress Notes (Signed)
Subjective:    Patient ID: [name], female    DOB: 09-Dec-1965, 53 y.o.   MRN: [id number]  HPI 53 year old African-American woman who has been followed by Dr. Lucio Edward for presumed diagnosis of irritable bowel syndrome. Her symptoms have typically consisted of cramping abdominal pain occurs for 2 to 3 days before having multiple loose bowel movements she also at times will have diaphoresis and colonic spasm. She did have some hematochezia and had an unremarkable colonoscopy. She did have a CT scan that was performed in 2013 that was read as showing calcifications due to "old granulomatous disease" it also had shown some small liver lesions that at the time were thought to be hemangiomas. More recently she had a CT the abdomen pelvis that showed multiple low-density hyperenhancing lesions in the right and left lobe of the liver that were more pronounced and were concerning for possible hepatocellular carcinoma. MRI also further elucidated these lesions and again raise the suspicion for malignancy. She underwent a liver biopsy and this revealed no evidence of malignancy but necrotizing granulomatous inflammation. Gram stain AFB and fungal smears were negative on the specimen that was submitted in formalin. She had blood test performed with hepatology and these included a QuantiFERON gold that was negative a slightly elevated sed rate at 28 and ANA that was negative liver function test that were normal, Q fever antibodies that were negative actin smooth muscle antibodies that were negative ceruloplasmin which was negative mitochondrial M2 antibody was negative negative hepatitis panel negative syphilis test negative HIV test and a histoplasma antibody that was that was positive for the yeast phase antibody at a titer of 1-16 but negative for the my seal fails antibody of less than 1-8. She was referred to our clinic for further evaluation and treatment. Have a normal angiotensin-converting  enzyme level.  In clinic we tested her urine for histoplasma antigen and this was negative we sent serum for Blastomyces antigen and this was negative. We did a serum cryptococcal antigen which was negative. Rechecked an angiotensin-converting enzyme level and it was normal.  I had her tissue sent to [facility] for broad-based PCR testing. Note this tissue had been in formalin which compromises our ability to detect DNA in the tissue.  The PCR is done in order to wash in [place] were negative for any bacteria by broad-based PCR for bacterial pathogens, it was negative for fungi and nontuberculous mycobacteria by PCR testing. It was specifically negative for Mycobacterium tuberculosis by PCR testing.  Was also negative for the following organism specifically by PCR testing, Bartonella Coccidioides in mentis, histoplasma,Blastomyces, cryptococcus, toxoplasma,T. Whipplei.  She was having some symptoms of abdominal pain and diarrhea that is alleviated by been till.  She did also have night sweats which she had initially attributed to the bouts of GI complaints but that she is now wondering whether or not but they might be due to menopausal symptoms.  In the interim we have sent antibodies for Coccidioides which were negative.  I have repeated her histoplasma antibodies which were with a yeast antibody endomysial antibody of 1-8.  Since then she underwent repeat liver biopsy with specimen sent to the microbiology lab for cultures and stains as well as [facility] in [place] for PCR testing.  Note this time the specimen was not sent in formalin.  Unfortunately I received a phone call from the lab at [facility].  Apparently the specimen was sent correctly was prepared  correctly and processed correctly but when put into the machine for PCR testing apparently the specimen became destroyed and they were not able to run out of the  test on it.  The data from our microbiology lab showed that she had an negative AFB smear and a negative fungal smear and no growth of organism on fungal culture was obtained after 4 weeks of intubation.  AFB cultures are still incubating 4 weeks and.  She had developed a cough.  I was  getting more more to feel that this is more likely sarcoidosis rather than an infection with a dimorphic fungus.  No microorganisms have been seen on any of the fungal or AFB stains both from cultures and from the pathology stains.  All of the labs we have done with the exception of one antibody test to histoplasma which was only barely positive have been unrevealing PCR testing from formalin and activated specimen did not yield an organism either.  She saw Dr. Cy Blamer who felt that the patient likely did have sarcoidosis\  He started on a prednisone taper between August and September  Repeat MRI showed:  1. Redemonstration of multiple T2 hyperintense, primarily hypervascular liver lesions. Although some lesions are similar and decreased, others are new and increased. Benign etiologies remain favored, given time course of change. Favor multiple adenomas, possibly in the setting of sarcoidosis (given appearance of the spleen). 2. Multiple T2 hypointense splenic lesions, favoring sarcoidosis or remote granulomatous infection.  CT of the chest showed stability of findings.    Dr. Lamonte Sakai felt that the patient was in remission when he saw her yesterday  Note in the interim since I last saw her she had worsening of her right upper quadrant pain and was seen by our GI who I believe obtained abdominal ultrasound I am having difficulty finding it in the system.  Her pain was still severe despite ultrasound being benign and she is advised to go to the emergency room due to the severity of her pain.  Instead she applied warm compresses and the pain eventually improved.  MRI was done after that.  I had some  concern that her waxing and waning abdominal pain might be related to the steroids potentially improving her necrotizing pathology in the liver and that coming off of them may be could be showing a worsening of these lesions though MRI done recently again shows that some lesions have decreased in some new ones have reappeared  Initially was going prescribe her prednisone not knowing that she just had seen Dr. Lamonte Sakai yesterday.  I am still uncertain as to whether or not she should be on a longer course of prednisone given her hepatobiliary pathology.    Past Medical History:  Diagnosis Date  . Anemia   . Arthritis   . GERD (gastroesophageal reflux disease)   . 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 Irritable bowel syndrome   . Overweight(278.02)   . Personal history of colonic polyps 09/19/2011   tubular adenoma  . Segmental colitis ([place])   . Tubular adenoma of colon     Past Surgical History:  Procedure Laterality Date  . COLONOSCOPY    . FACET JOINT INJECTION  03/28/11   injections in neck  . hysterectomy  2000  . KNEE SURGERY Right     Right knee arthroscopic partial lateral meniscectomy (01/22/07),arthroscopic partial lateral meniscectomy 5/09  . POLYPECTOMY    . SPINE SURGERY  10/13/06   Posterior interbody fusion (PLIF) L5-S1 (07/04/07)  .  WRIST FRACTURE SURGERY Left 2011   Due to injury at work in 2008.    Family History  Problem Relation Age of Onset  . Asthma Mother   . Cancer Mother        gastric tumor  . Glaucoma Mother   . Hyperlipidemia Mother   . COPD Mother   . Stomach cancer Mother   . Esophageal cancer Father        mets to lung and liver  . Hypertension Maternal Aunt   . Colon cancer Neg Hx   . Colon polyps Neg Hx   . Rectal cancer Neg Hx       Social History   Socioeconomic History  . Marital status: Divorced    Spouse name: Not on file  . Number of children: 2  . Years of education: Not on file  . Highest education level: Not on file  Occupational  History  . Occupation: Disabled  Social Needs  . Financial resource strain: Not on file  . Food insecurity    Worry: Not on file    Inability: Not on file  . Transportation needs    Medical: Not on file    Non-medical: Not on file  Tobacco Use  . Smoking status: Never Smoker  . Smokeless tobacco: Never Used  Substance and Sexual Activity  . Alcohol use: No    Alcohol/week: 0.0 standard drinks  . Drug use: No  . Sexual activity: Yes  Lifestyle  . Physical activity    Days per week: Not on file    Minutes per session: Not on file  . Stress: Not on file  Relationships  . Social Herbalist on phone: Not on file    Gets together: Not on file    Attends religious service: Not on file    Active member of club or organization: Not on file    Attends meetings of clubs or organizations: Not on file    Relationship status: Not on file  Other Topics Concern  . Not on file  Social History Narrative   Regular exercise:  No   Caffeine Use:  Occasional sweet tea   She is legally separated   2 children ages 18 year old son and 68 year old daughter.  Both children.   Previously worked in Scientist, research (medical), now disabled.   Completed 2 yrs of college.      No Known Allergies   Current Outpatient Medications:  .  dicyclomine (BENTYL) 20 MG tablet, TAKE 1 TABLET BY MOUTH THREE TIMES DAILY, Disp: 90 tablet, Rfl: 8 .  famotidine (PEPCID) 40 MG tablet, Take 1 tablet (40 mg total) by mouth at bedtime., Disp: 30 tablet, Rfl: 11 .  HYDROcodone-homatropine (HYCODAN) 5-1.5 MG/5ML syrup, Take 5 mLs by mouth every 6 (six) hours as needed for cough., Disp: 240 mL, Rfl: 0 .  hyoscyamine (LEVSIN SL) 0.125 MG SL tablet, DISSOLVE 1 TABLET IN MOUTH EVERY 4 HOURS AS NEEDED, Disp: 30 tablet, Rfl: 5 .  methocarbamol (ROBAXIN) 750 MG tablet, Take 750 mg by mouth 2 (two) times daily as needed for muscle spasms., Disp: , Rfl:  .  pantoprazole (PROTONIX) 40 MG tablet, Take 1 tablet by mouth twice daily, Disp:  60 tablet, Rfl: 0 .  predniSONE (DELTASONE) 10 MG tablet, 30mg  daily for the next 3 weeks.  At that time decrease to 20mg  daily for 5 days, then to 10mg  daily for 5 days, then stop., Disp: 78 tablet, Rfl: 0 .  traMADol (ULTRAM) 50 MG tablet, Take 50 mg by mouth 3 (three) times daily as needed for moderate pain. , Disp: , Rfl:   Past Medical History:  Diagnosis Date  . Anemia   . Arthritis   . GERD (gastroesophageal reflux disease)   . 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 Irritable bowel syndrome   . Overweight(278.02)   . Personal history of colonic polyps 09/19/2011   tubular adenoma  . Segmental colitis ([place])   . Tubular adenoma of colon     Past Surgical History:  Procedure Laterality Date  . COLONOSCOPY    . FACET JOINT INJECTION  03/28/11   injections in neck  . hysterectomy  2000  . KNEE SURGERY Right     Right knee arthroscopic partial lateral meniscectomy (01/22/07),arthroscopic partial lateral meniscectomy 5/09  . POLYPECTOMY    . SPINE SURGERY  10/13/06   Posterior interbody fusion (PLIF) L5-S1 (07/04/07)  . WRIST FRACTURE SURGERY Left 2011   Due to injury at work in 2008.    Family History  Problem Relation Age of Onset  . Asthma Mother   . Cancer Mother        gastric tumor  . Glaucoma Mother   . Hyperlipidemia Mother   . COPD Mother   . Stomach cancer Mother   . Esophageal cancer Father        mets to lung and liver  . Hypertension Maternal Aunt   . Colon cancer Neg Hx   . Colon polyps Neg Hx   . Rectal cancer Neg Hx       Social History   Socioeconomic History  . Marital status: Divorced    Spouse name: Not on file  . Number of children: 2  . Years of education: Not on file  . Highest education level: Not on file  Occupational History  . Occupation: Disabled  Social Needs  . Financial resource strain: Not on file  . Food insecurity    Worry: Not on file    Inability: Not on file  . Transportation needs    Medical: Not on file    Non-medical: Not  on file  Tobacco Use  . Smoking status: Never Smoker  . Smokeless tobacco: Never Used  Substance and Sexual Activity  . Alcohol use: No    Alcohol/week: 0.0 standard drinks  . Drug use: No  . Sexual activity: Yes  Lifestyle  . Physical activity    Days per week: Not on file    Minutes per session: Not on file  . Stress: Not on file  Relationships  . Social Herbalist on phone: Not on file    Gets together: Not on file    Attends religious service: Not on file    Active member of club or organization: Not on file    Attends meetings of clubs or organizations: Not on file    Relationship status: Not on file  Other Topics Concern  . Not on file  Social History Narrative   Regular exercise:  No   Caffeine Use:  Occasional sweet tea   She is legally separated   2 children ages 51 year old son and 67 year old daughter.  Both children.   Previously worked in Scientist, research (medical), now disabled.   Completed 2 yrs of college.      No Known Allergies   Current Outpatient Medications:  .  dicyclomine (BENTYL) 20 MG tablet, TAKE 1 TABLET BY MOUTH THREE TIMES DAILY, Disp:  90 tablet, Rfl: 8 .  famotidine (PEPCID) 40 MG tablet, Take 1 tablet (40 mg total) by mouth at bedtime., Disp: 30 tablet, Rfl: 11 .  HYDROcodone-homatropine (HYCODAN) 5-1.5 MG/5ML syrup, Take 5 mLs by mouth every 6 (six) hours as needed for cough., Disp: 240 mL, Rfl: 0 .  hyoscyamine (LEVSIN SL) 0.125 MG SL tablet, DISSOLVE 1 TABLET IN MOUTH EVERY 4 HOURS AS NEEDED, Disp: 30 tablet, Rfl: 5 .  methocarbamol (ROBAXIN) 750 MG tablet, Take 750 mg by mouth 2 (two) times daily as needed for muscle spasms., Disp: , Rfl:  .  pantoprazole (PROTONIX) 40 MG tablet, Take 1 tablet by mouth twice daily, Disp: 60 tablet, Rfl: 0 .  predniSONE (DELTASONE) 10 MG tablet, 30mg  daily for the next 3 weeks.  At that time decrease to 20mg  daily for 5 days, then to 10mg  daily for 5 days, then stop., Disp: 78 tablet, Rfl: 0 .  traMADol (ULTRAM) 50  MG tablet, Take 50 mg by mouth 3 (three) times daily as needed for moderate pain. , Disp: , Rfl:     Review of Systems  Constitutional: Negative for activity change, appetite change, chills, diaphoresis, fatigue, fever and unexpected weight change.  HENT: Negative for congestion, rhinorrhea, sinus pressure, sneezing, sore throat and trouble swallowing.   Eyes: Negative for photophobia and visual disturbance.  Respiratory: Positive for cough. Negative for chest tightness, shortness of breath, wheezing and stridor.   Cardiovascular: Negative for chest pain, palpitations and leg swelling.  Gastrointestinal: Positive for abdominal pain and diarrhea. Negative for abdominal distention, anal bleeding, blood in stool, constipation, nausea and vomiting.  Genitourinary: Negative for difficulty urinating, dysuria, flank pain and hematuria.  Musculoskeletal: Negative for arthralgias, back pain, gait problem, joint swelling and myalgias.  Skin: Negative for color change, pallor, rash and wound.  Neurological: Negative for dizziness, tremors, weakness and light-headedness.  Hematological: Negative for adenopathy. Does not bruise/bleed easily.  Psychiatric/Behavioral: Negative for agitation, behavioral problems, confusion, decreased concentration, dysphoric mood and sleep disturbance.       Objective:   Physical Exam Constitutional:      General: She is not in acute distress.    Appearance: She is well-developed. She is not diaphoretic.  HENT:     Head: Normocephalic and atraumatic.     Mouth/Throat:     Pharynx: No oropharyngeal exudate.  Eyes:     General: No scleral icterus.    Conjunctiva/sclera: Conjunctivae normal.  Neck:     Musculoskeletal: Normal range of motion and neck supple.  Cardiovascular:     Rate and Rhythm: Normal rate and regular rhythm.     Heart sounds: No murmur. No friction rub. No gallop.   Pulmonary:     Effort: Pulmonary effort is normal. No respiratory distress.      Breath sounds: Normal breath sounds. No wheezing.  Abdominal:     General: Bowel sounds are normal. There is no distension.     Palpations: There is no mass.     Tenderness: There is no abdominal tenderness.     Hernia: No hernia is present.  Musculoskeletal:        General: No tenderness.  Skin:    General: Skin is warm and dry.     Coloration: Skin is not jaundiced or pale.     Findings: No erythema or rash.  Neurological:     General: No focal deficit present.     Mental Status: She is alert and oriented to person, place, and time. Mental  status is at baseline.     Motor: No abnormal muscle tone.     Coordination: Coordination normal.  Psychiatric:        Mood and Affect: Mood normal.        Behavior: Behavior normal.        Thought Content: Thought content normal.        Judgment: Judgment normal.           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  Again I am not an expert in this area but it seems to me that she might benefit the being on a longer course of corticosteroids with more improvement seen on MRI  but I would defer to Dr Lamonte Sakai, [place] from Hepatology and Dr. Fuller Plan as sarcoidosis is not the [place] of an Infectious Disease physician  I will get him to see me in 2 months time to although she may not need my help if this is really not an infectious disease as it is to be the case.  Need for flu a flu vaccine I convinced her to get the flu vaccine infection in light of the upcoming season which will have COVID-19 influenza circulating at the same time and there is both potential for dual infection of her sequential infection with these potentially fatal viruses.  I spent greater than 25 minutes with the patient including greater than 50% of time in face to face counsel of the patient adding her  work-up for sarcoidosis, response to corticosteroids need for influenza vaccination and coordination of her care.

## 2019-03-26 DIAGNOSIS — K7689 Other specified diseases of liver: Secondary | ICD-10-CM | POA: Diagnosis not present

## 2019-03-30 ENCOUNTER — Other Ambulatory Visit: Payer: Self-pay | Admitting: Gastroenterology

## 2019-04-01 DIAGNOSIS — D376 Neoplasm of uncertain behavior of liver, gallbladder and bile ducts: Secondary | ICD-10-CM | POA: Diagnosis not present

## 2019-04-01 DIAGNOSIS — K7110 Toxic liver disease with hepatic necrosis, without coma: Secondary | ICD-10-CM | POA: Diagnosis not present

## 2019-04-17 ENCOUNTER — Other Ambulatory Visit (HOSPITAL_COMMUNITY): Payer: Self-pay | Admitting: Nurse Practitioner

## 2019-04-17 ENCOUNTER — Other Ambulatory Visit: Payer: Self-pay | Admitting: Nurse Practitioner

## 2019-04-17 DIAGNOSIS — D376 Neoplasm of uncertain behavior of liver, gallbladder and bile ducts: Secondary | ICD-10-CM

## 2019-04-18 LAB — MISCELLANEOUS TEST

## 2019-04-26 ENCOUNTER — Encounter (HOSPITAL_COMMUNITY): Payer: Medicare HMO

## 2019-04-26 ENCOUNTER — Encounter (HOSPITAL_COMMUNITY): Payer: Self-pay

## 2019-04-29 DIAGNOSIS — M62830 Muscle spasm of back: Secondary | ICD-10-CM | POA: Diagnosis not present

## 2019-04-29 DIAGNOSIS — M791 Myalgia, unspecified site: Secondary | ICD-10-CM | POA: Diagnosis not present

## 2019-04-29 DIAGNOSIS — M545 Low back pain: Secondary | ICD-10-CM | POA: Diagnosis not present

## 2019-04-30 ENCOUNTER — Other Ambulatory Visit: Payer: Self-pay | Admitting: Gastroenterology

## 2019-05-07 ENCOUNTER — Ambulatory Visit (HOSPITAL_COMMUNITY)
Admission: RE | Admit: 2019-05-07 | Discharge: 2019-05-07 | Disposition: A | Discharge status: Home / Self Care | Payer: Medicare HMO | Source: Ambulatory Visit | Attending: Nurse Practitioner | Admitting: Nurse Practitioner

## 2019-05-07 ENCOUNTER — Other Ambulatory Visit: Payer: Self-pay

## 2019-05-07 DIAGNOSIS — K7689 Other specified diseases of liver: Secondary | ICD-10-CM | POA: Diagnosis not present

## 2019-05-07 DIAGNOSIS — Z79899 Other long term (current) drug therapy: Secondary | ICD-10-CM | POA: Diagnosis not present

## 2019-05-07 DIAGNOSIS — K753 Granulomatous hepatitis, not elsewhere classified: Secondary | ICD-10-CM | POA: Diagnosis not present

## 2019-05-07 DIAGNOSIS — R918 Other nonspecific abnormal finding of lung field: Secondary | ICD-10-CM | POA: Insufficient documentation

## 2019-05-07 DIAGNOSIS — K769 Liver disease, unspecified: Secondary | ICD-10-CM | POA: Diagnosis not present

## 2019-05-07 DIAGNOSIS — D376 Neoplasm of uncertain behavior of liver, gallbladder and bile ducts: Secondary | ICD-10-CM

## 2019-05-07 LAB — GLUCOSE, CAPILLARY: Glucose-Capillary: 97 mg/dL (ref 70–99)

## 2019-05-07 MED ORDER — FLUDEOXYGLUCOSE F - 18 (FDG) INJECTION
9.8400 | Freq: Once | INTRAVENOUS | Status: AC
  Administered 2019-05-07: 09:00:00 9.84 via INTRAVENOUS

## 2019-05-16 DIAGNOSIS — M545 Low back pain: Secondary | ICD-10-CM | POA: Diagnosis not present

## 2019-05-20 ENCOUNTER — Ambulatory Visit: Payer: Medicare HMO | Admitting: Infectious Disease

## 2019-05-22 DIAGNOSIS — M545 Low back pain: Secondary | ICD-10-CM | POA: Diagnosis not present

## 2019-05-24 ENCOUNTER — Other Ambulatory Visit: Payer: Self-pay | Admitting: Infectious Disease

## 2019-05-27 ENCOUNTER — Telehealth: Payer: Self-pay | Admitting: *Deleted

## 2019-05-27 NOTE — Telephone Encounter (Signed)
I believe she has an e visit with me very soon. Dawn Drazek from Hepatology was very skeptical that pt has sarcoid

## 2019-05-27 NOTE — Telephone Encounter (Signed)
Received refill request for prednisone from Dr Tommy Medal.  Last note states this should be prescribed by a different provider, it is no longer active on her medication list. RN left message for patient, asking her to call back regarding refill request. Unsure if she will need a taper, as it seems this is asking for 30 mg prednisone daily. Landis Gandy, RN   Name from pharmacy: PREDNISONE 20MG  TABLETS        Will file in chart as: predniSONE (DELTASONE) 20 MG tablet   The original prescription was discontinued on 03/20/2019 by Tommy Medal, Lavell Islam, MD. Renewing this prescription may not be appropriate.   Sig: TAKE 1 AND 1/2 TABLETS(30 MG) BY MOUTH DAILY WITH BREAKFAST   Disp:  45 tablet  Refills:  1 (Pharmacy requested: Not specified)   Start: 05/24/2019   Class: Normal   Non-formulary   Last ordered: 2 months ago by Truman Hayward, MD Last refill: 04/26/2019

## 2019-05-29 ENCOUNTER — Ambulatory Visit: Payer: Medicare HMO | Admitting: Infectious Disease

## 2019-06-06 DIAGNOSIS — M545 Low back pain: Secondary | ICD-10-CM | POA: Diagnosis not present

## 2019-06-06 DIAGNOSIS — M8448XD Pathological fracture, other site, subsequent encounter for fracture with routine healing: Secondary | ICD-10-CM | POA: Diagnosis not present

## 2019-06-11 ENCOUNTER — Other Ambulatory Visit (HOSPITAL_COMMUNITY): Payer: Self-pay | Admitting: Interventional Radiology

## 2019-06-11 ENCOUNTER — Telehealth (HOSPITAL_COMMUNITY): Payer: Self-pay | Admitting: Radiology

## 2019-06-11 DIAGNOSIS — M8448XA Pathological fracture, other site, initial encounter for fracture: Secondary | ICD-10-CM

## 2019-06-11 NOTE — Telephone Encounter (Signed)
Received return call from patient's cell phone. Call dropped. Unable to reach pt back. Will try tomorrow to reach pt. JM

## 2019-06-11 NOTE — Telephone Encounter (Signed)
Called pt, left VM for her to call to schedule bilateral sacroplasty with Deveshwar. JM

## 2019-06-12 DIAGNOSIS — Z780 Asymptomatic menopausal state: Secondary | ICD-10-CM | POA: Diagnosis not present

## 2019-06-12 DIAGNOSIS — E2839 Other primary ovarian failure: Secondary | ICD-10-CM | POA: Diagnosis not present

## 2019-06-12 DIAGNOSIS — Z1231 Encounter for screening mammogram for malignant neoplasm of breast: Secondary | ICD-10-CM | POA: Diagnosis not present

## 2019-06-12 DIAGNOSIS — Z90710 Acquired absence of both cervix and uterus: Secondary | ICD-10-CM | POA: Diagnosis not present

## 2019-06-12 DIAGNOSIS — R29890 Loss of height: Secondary | ICD-10-CM | POA: Diagnosis not present

## 2019-06-12 DIAGNOSIS — Z981 Arthrodesis status: Secondary | ICD-10-CM | POA: Diagnosis not present

## 2019-06-17 ENCOUNTER — Other Ambulatory Visit: Payer: Self-pay | Admitting: Radiology

## 2019-06-18 ENCOUNTER — Other Ambulatory Visit: Payer: Self-pay

## 2019-06-18 ENCOUNTER — Encounter (HOSPITAL_COMMUNITY): Payer: Self-pay

## 2019-06-18 ENCOUNTER — Ambulatory Visit (HOSPITAL_COMMUNITY)
Admission: RE | Admit: 2019-06-18 | Discharge: 2019-06-18 | Disposition: A | Discharge status: Home / Self Care | Payer: Medicare HMO | Source: Ambulatory Visit | Attending: Interventional Radiology | Admitting: Interventional Radiology

## 2019-06-18 DIAGNOSIS — M8438XD Stress fracture, other site, subsequent encounter for fracture with routine healing: Secondary | ICD-10-CM | POA: Diagnosis not present

## 2019-06-18 DIAGNOSIS — M545 Low back pain: Secondary | ICD-10-CM | POA: Diagnosis not present

## 2019-06-18 DIAGNOSIS — Z79899 Other long term (current) drug therapy: Secondary | ICD-10-CM | POA: Diagnosis not present

## 2019-06-18 DIAGNOSIS — M8448XA Pathological fracture, other site, initial encounter for fracture: Secondary | ICD-10-CM

## 2019-06-18 DIAGNOSIS — K219 Gastro-esophageal reflux disease without esophagitis: Secondary | ICD-10-CM | POA: Insufficient documentation

## 2019-06-18 DIAGNOSIS — K589 Irritable bowel syndrome without diarrhea: Secondary | ICD-10-CM | POA: Diagnosis not present

## 2019-06-18 DIAGNOSIS — S3210XG Unspecified fracture of sacrum, subsequent encounter for fracture with delayed healing: Secondary | ICD-10-CM | POA: Diagnosis not present

## 2019-06-18 HISTORY — PX: IR SACROPLASTY BILATERAL: IMG5561

## 2019-06-18 LAB — CBC
HCT: 34.2 % — ABNORMAL LOW (ref 36.0–46.0)
Hemoglobin: 11.1 g/dL — ABNORMAL LOW (ref 12.0–15.0)
MCH: 29.1 pg (ref 26.0–34.0)
MCHC: 32.5 g/dL (ref 30.0–36.0)
MCV: 89.5 fL (ref 80.0–100.0)
Platelets: 559 10*3/uL — ABNORMAL HIGH (ref 150–400)
RBC: 3.82 MIL/uL — ABNORMAL LOW (ref 3.87–5.11)
RDW: 13.8 % (ref 11.5–15.5)
WBC: 9.2 10*3/uL (ref 4.0–10.5)
nRBC: 0 % (ref 0.0–0.2)

## 2019-06-18 LAB — URINALYSIS, COMPLETE (UACMP) WITH MICROSCOPIC
Bilirubin Urine: NEGATIVE
Glucose, UA: NEGATIVE mg/dL
Ketones, ur: NEGATIVE mg/dL
Leukocytes,Ua: NEGATIVE
Nitrite: NEGATIVE
Protein, ur: NEGATIVE mg/dL
Specific Gravity, Urine: 1.011 (ref 1.005–1.030)
pH: 5 (ref 5.0–8.0)

## 2019-06-18 MED ORDER — SODIUM CHLORIDE 0.9 % IV SOLN: INTRAVENOUS | Status: DC

## 2019-06-18 MED ORDER — MIDAZOLAM HCL 2 MG/2ML IJ SOLN
INTRAMUSCULAR | Status: AC
  Filled 2019-06-18: qty 2

## 2019-06-18 MED ORDER — BUPIVACAINE HCL (PF) 0.5 % IJ SOLN
INTRAMUSCULAR | Status: AC | PRN
  Administered 2019-06-18: 30 mL

## 2019-06-18 MED ORDER — IOHEXOL 300 MG/ML  SOLN
50.0000 mL | Freq: Once | INTRAMUSCULAR | Status: AC | PRN
  Administered 2019-06-18: 3 mL

## 2019-06-18 MED ORDER — CEFAZOLIN SODIUM-DEXTROSE 2-4 GM/100ML-% IV SOLN: 2.0000 g | INTRAVENOUS | Status: AC

## 2019-06-18 MED ORDER — TOBRAMYCIN SULFATE 1.2 G IJ SOLR
INTRAMUSCULAR | Status: AC | PRN
  Administered 2019-06-18: .01 g via TOPICAL

## 2019-06-18 MED ORDER — BUPIVACAINE HCL (PF) 0.5 % IJ SOLN
INTRAMUSCULAR | Status: AC
  Filled 2019-06-18: qty 30

## 2019-06-18 MED ORDER — SODIUM CHLORIDE 0.9 % IV SOLN: INTRAVENOUS | Status: AC

## 2019-06-18 MED ORDER — TOBRAMYCIN SULFATE 1.2 G IJ SOLR
INTRAMUSCULAR | Status: AC
  Filled 2019-06-18: qty 1.2

## 2019-06-18 MED ORDER — GELATIN ABSORBABLE 12-7 MM EX MISC
CUTANEOUS | Status: AC
  Filled 2019-06-18: qty 1

## 2019-06-18 MED ORDER — CEFAZOLIN SODIUM-DEXTROSE 2-4 GM/100ML-% IV SOLN
INTRAVENOUS | Status: AC
  Administered 2019-06-18: 2 g via INTRAVENOUS
  Filled 2019-06-18: qty 100

## 2019-06-18 MED ORDER — HYDROMORPHONE HCL 1 MG/ML IJ SOLN
INTRAMUSCULAR | Status: AC
  Filled 2019-06-18: qty 1

## 2019-06-18 MED ORDER — HYDROMORPHONE HCL 1 MG/ML IJ SOLN
INTRAMUSCULAR | Status: AC | PRN
  Administered 2019-06-18: 1 mg via INTRAVENOUS

## 2019-06-18 MED ORDER — MIDAZOLAM HCL 2 MG/2ML IJ SOLN
INTRAMUSCULAR | Status: AC | PRN
  Administered 2019-06-18 (×2): 1 mg via INTRAVENOUS

## 2019-06-18 MED ORDER — FENTANYL CITRATE (PF) 100 MCG/2ML IJ SOLN
INTRAMUSCULAR | Status: AC | PRN
  Administered 2019-06-18 (×3): 25 ug via INTRAVENOUS

## 2019-06-18 MED ORDER — GELATIN ABSORBABLE 12-7 MM EX MISC
CUTANEOUS | Status: AC | PRN
  Administered 2019-06-18: 1 via TOPICAL

## 2019-06-18 MED ORDER — FENTANYL CITRATE (PF) 100 MCG/2ML IJ SOLN
INTRAMUSCULAR | Status: AC
  Filled 2019-06-18: qty 2

## 2019-06-18 NOTE — Procedures (Signed)
S/P sacroplasty bilateral approach S.Davell Beckstead MD

## 2019-06-18 NOTE — Discharge Instructions (Signed)
1.No stooping ,bending or lifting more than 10 lbs for 2 weeks. 2.Use walker to ambulate for 2 weeks. 3.RTC PRN 2 weeks  4.No driving for 2 weeks  SACROPLASTY/KYPHOPLASTY/VERTEBROPLASTY DISCHARGE INSTRUCTIONS  Medications:     Resume all home medications as before procedure.                  Continue your pain medications as prescribed as needed.  Over the next 3-5 days, decrease your pain medication as tolerated.  Over the counter medications (i.e. Tylenol, ibuprofen, and aleve) may be substituted once severe/moderate pain symptoms have subsided.   Wound Care: - Bandages may be removed the day following your procedure.  You may get your incision wet once bandages are removed.  Bandaids may be used to cover the incisions until scab formation.  Topical ointments are optional.  - If you develop a fever greater than 101 degrees, have increased skin redness at the incision sites or pus-like oozing from incisions occurring within 1 week of the procedure, contact radiology at [phone] or [phone].  - Ice pack to back for 15-20 minutes 2-3 time per day for first 2-3 days post procedure.  The ice will expedite muscle healing and help with the pain from the incisions.   Activity: - Bedrest today with limited activity for 24 hours post procedure.  - No driving for 48 hours.  - Increase your activity as tolerated after bedrest (with assistance if necessary).  - Refrain from any strenuous activity or heavy lifting (greater than 10 lbs.).   Follow up:   - If a biopsy was performed at the time of your procedure, your referring physician should receive the results in usually 2-3 days.

## 2019-06-18 NOTE — H&P (Signed)
Chief Complaint: Patient was seen in consultation today for bilateral sacroplasty at the request of Dr Ron Agee, Viona Gilmore   Supervising Physician: Luanne Bras  Patient Status: [facility] - Out-pt  History of Present Illness: [name] is a 53 y.o. female   Pt has developed low back pain and buttock after getting up from bed approx 4 weeks ago Pain has been treated with injection and narcotics-- helps some-- but still with persistent pain in low back and buttocks. Especially painful to come to a stand up position "8" on scale of 10  MRI 05/22/19  from outside facility shows + edema Bilateral sacrum-- consistent with insufficiency fractures Sent to Dr Estanislado Pandy for evaluation/management of same  Scheduled now for Bilateral sacroplasty procedure  Past Medical History:  Diagnosis Date  . Anemia   . Arthritis   . GERD (gastroesophageal reflux disease)   . 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 Irritable bowel syndrome   . Overweight(278.02)   . Personal history of colonic polyps 09/19/2011   tubular adenoma  . Sarcoidosis 03/20/2019  . Segmental colitis ([place])   . Tubular adenoma of colon     Past Surgical History:  Procedure Laterality Date  . COLONOSCOPY    . FACET JOINT INJECTION  03/28/11   injections in neck  . hysterectomy  2000  . KNEE SURGERY Right     Right knee arthroscopic partial lateral meniscectomy (01/22/07),arthroscopic partial lateral meniscectomy 5/09  . POLYPECTOMY    . SPINE SURGERY  10/13/06   Posterior interbody fusion (PLIF) L5-S1 (07/04/07)  . WRIST FRACTURE SURGERY Left 2011   Due to injury at work in 2008.    Allergies: Patient has no known allergies.  Medications: Prior to Admission medications   Medication Sig Start Date End Date Taking? Authorizing Provider  dicyclomine (BENTYL) 20 MG tablet TAKE 1 TABLET BY MOUTH THREE TIMES DAILY 02/25/19  Yes Ladene Artist, MD  famotidine (PEPCID) 40 MG tablet Take 1 tablet (40 mg total) by mouth at  bedtime. 10/31/18  Yes Ladene Artist, MD  HYDROcodone-homatropine [facility]) 5-1.5 MG/5ML syrup Take 5 mLs by mouth every 6 (six) hours as needed for cough. 02/05/19  Yes Byrum, Rose Fillers, MD  hyoscyamine (LEVSIN SL) 0.125 MG SL tablet DISSOLVE 1 TABLET IN MOUTH EVERY 4 HOURS AS NEEDED 03/19/19  Yes Ladene Artist, MD  methocarbamol (ROBAXIN) 750 MG tablet Take 750 mg by mouth 2 (two) times daily as needed for muscle spasms.   Yes [provider]  pantoprazole (PROTONIX) 40 MG tablet Take 1 tablet by mouth twice daily 05/01/19  Yes Ladene Artist, MD  traMADol (ULTRAM) 50 MG tablet Take 50 mg by mouth 3 (three) times daily as needed for moderate pain.    Yes [provider]  omeprazole (PRILOSEC) 20 MG capsule Take 20 mg by mouth daily.  09/08/11  [provider]     Family History  Problem Relation Age of Onset  . Asthma Mother   . Cancer Mother        gastric tumor  . Glaucoma Mother   . Hyperlipidemia Mother   . COPD Mother   . Stomach cancer Mother   . Esophageal cancer Father        mets to lung and liver  . Hypertension Maternal Aunt   . Colon cancer Neg Hx   . Colon polyps Neg Hx   . Rectal cancer Neg Hx     Social History   Socioeconomic History  .  Marital status: Divorced    Spouse name: Not on file  . Number of children: 2  . Years of education: Not on file  . Highest education level: Not on file  Occupational History  . Occupation: Disabled  Tobacco Use  . Smoking status: Never Smoker  . Smokeless tobacco: Never Used  Substance and Sexual Activity  . Alcohol use: No    Alcohol/week: 0.0 standard drinks  . Drug use: No  . Sexual activity: Yes  Other Topics Concern  . Not on file  Social History Narrative   Regular exercise:  No   Caffeine Use:  Occasional sweet tea   She is legally separated   2 children ages 52 year old son and 75 year old daughter.  Both children.   Previously worked in Scientist, research (medical), now disabled.   Completed 2 yrs  of college.     Social Determinants of Health   Financial Resource Strain:   . Difficulty of Paying Living Expenses: Not on file  Food Insecurity:   . Worried About Charity fundraiser in the Last Year: Not on file  . Ran Out of Food in the Last Year: Not on file  Transportation Needs:   . Lack of Transportation (Medical): Not on file  . Lack of Transportation (Non-Medical): Not on file  Physical Activity:   . Days of Exercise per Week: Not on file  . Minutes of Exercise per Session: Not on file  Stress:   . Feeling of Stress : Not on file  Social Connections:   . Frequency of Communication with Friends and Family: Not on file  . Frequency of Social Gatherings with Friends and Family: Not on file  . Attends Religious Services: Not on file  . Active Member of Clubs or Organizations: Not on file  . Attends Archivist Meetings: Not on file  . Marital Status: Not on file     Review of Systems: A 12 point ROS discussed and pertinent positives are indicated in the HPI above.  All other systems are negative.  Review of Systems  Constitutional: Positive for activity change. Negative for appetite change, fatigue and fever.  Respiratory: Negative for cough and shortness of breath.   Cardiovascular: Negative for chest pain.  Gastrointestinal: Negative for abdominal pain.  Musculoskeletal: Positive for back pain and gait problem.  Neurological: Negative for weakness.  Psychiatric/Behavioral: Negative for behavioral problems and confusion.    Vital Signs: BP (!) 149/102   Pulse (!) 104   Temp 98.5 F (36.9 C) (Oral)   Resp 15   Ht 5\' 1"  (1.549 m)   Wt 190 lb (86.2 kg)   LMP 02/25/2009   SpO2 100%   BMI 35.90 kg/m   Physical Exam Vitals reviewed.  Cardiovascular:     Rate and Rhythm: Normal rate and regular rhythm.     Heart sounds: Normal heart sounds.  Pulmonary:     Effort: Pulmonary effort is normal.     Breath sounds: Normal breath sounds.  Abdominal:      Palpations: Abdomen is soft.  Musculoskeletal:        General: Normal range of motion.     Comments: Low back and B buttock pain  Skin:    General: Skin is warm and dry.  Neurological:     Mental Status: She is alert and oriented to person, place, and time.  Psychiatric:        Mood and Affect: Mood normal.  Behavior: Behavior normal.        Thought Content: Thought content normal.        Judgment: Judgment normal.     Imaging: No results found.  Labs:  CBC: Recent Labs    01/03/19 0612 01/18/19 0930 03/07/19 1130  WBC 7.4 13.0* 10.6*  HGB 11.5* 11.9* 12.0  HCT 35.2* 35.4* 36.0  PLT 426* 438* 407.0*    COAGS: Recent Labs    01/03/19 0612 01/18/19 0930  INR 1.0 1.1    BMP: Recent Labs    01/18/19 0930 03/07/19 1130  NA 141 140  K 3.3* 3.5  CL 103 101  CO2 27 34*  GLUCOSE 100* 100*  BUN 11 9  CALCIUM 9.4 9.3  CREATININE 0.78 0.79  GFRNONAA >60  --   GFRAA >60  --     LIVER FUNCTION TESTS: Recent Labs    01/18/19 0930 03/07/19 1130  BILITOT 0.3 0.4  AST 20 12  ALT 13 9  ALKPHOS 95 94  PROT 7.8 7.7  ALBUMIN 3.5 3.9    TUMOR MARKERS: No results for input(s): AFPTM, CEA, CA199, CHROMGRNA in the last 8760 hours.  Assessment and Plan:  Low back and Bila buttock pain x 1 mo Pain meds and injection with little relief Persistent pain "8" on scale of 10 MRI revealing Bila sacral insufficiency fractures Scheduled now for Bilat sacroplasty procedure Risks and benefits of Sacroplasty were discussed with the patient including, but not limited to education regarding the natural healing process of compression fractures without intervention, bleeding, infection, cement migration which may cause spinal cord damage, paralysis, pulmonary embolism or even death.  This interventional procedure involves the use of X-rays and because of the nature of the planned procedure, it is possible that we will have prolonged use of X-ray fluoroscopy.  Potential  radiation risks to you include (but are not limited to) the following: - A slightly elevated risk for cancer  several years later in life. This risk is typically less than 0.5% percent. This risk is low in comparison to the normal incidence of human cancer, which is 33% for women and 50% for men according to the [employer]. - Radiation induced injury can include skin redness, resembling a rash, tissue breakdown / ulcers and hair loss (which can be temporary or permanent).   The likelihood of either of these occurring depends on the difficulty of the procedure and whether you are sensitive to radiation due to previous procedures, disease, or genetic conditions.   IF your procedure requires a prolonged use of radiation, you will be notified and given written instructions for further action.  It is your responsibility to monitor the irradiated area for the 2 weeks following the procedure and to notify your physician if you are concerned that you have suffered a radiation induced injury.    All of the patient's questions were answered, patient is agreeable to proceed. Consent signed and in chart.  Thank you for this interesting consult.  I greatly enjoyed meeting [name] and look forward to participating in their care.  A copy of this report was sent to the requesting provider on this date.  Electronically Signed: Lavonia Drafts, PA-C 06/18/2019, 7:44 AM   I spent a total of    25 Minutes in face to face in clinical consultation, greater than 50% of which was counseling/coordinating care for Sacroplasty

## 2019-08-12 DIAGNOSIS — M810 Age-related osteoporosis without current pathological fracture: Secondary | ICD-10-CM | POA: Diagnosis not present

## 2019-08-12 DIAGNOSIS — R5383 Other fatigue: Secondary | ICD-10-CM | POA: Diagnosis not present

## 2019-08-12 DIAGNOSIS — E559 Vitamin D deficiency, unspecified: Secondary | ICD-10-CM | POA: Diagnosis not present

## 2019-08-22 DIAGNOSIS — M8580 Other specified disorders of bone density and structure, unspecified site: Secondary | ICD-10-CM | POA: Diagnosis not present

## 2019-08-22 DIAGNOSIS — E559 Vitamin D deficiency, unspecified: Secondary | ICD-10-CM | POA: Diagnosis not present

## 2019-08-27 DIAGNOSIS — K7110 Toxic liver disease with hepatic necrosis, without coma: Secondary | ICD-10-CM | POA: Diagnosis not present

## 2019-08-27 DIAGNOSIS — R918 Other nonspecific abnormal finding of lung field: Secondary | ICD-10-CM | POA: Diagnosis not present

## 2019-08-27 DIAGNOSIS — K589 Irritable bowel syndrome without diarrhea: Secondary | ICD-10-CM | POA: Diagnosis not present

## 2019-08-27 DIAGNOSIS — K769 Liver disease, unspecified: Secondary | ICD-10-CM | POA: Diagnosis not present

## 2019-09-03 DIAGNOSIS — K7290 Hepatic failure, unspecified without coma: Secondary | ICD-10-CM | POA: Diagnosis not present

## 2019-09-03 DIAGNOSIS — D376 Neoplasm of uncertain behavior of liver, gallbladder and bile ducts: Secondary | ICD-10-CM | POA: Diagnosis not present

## 2019-09-03 DIAGNOSIS — K753 Granulomatous hepatitis, not elsewhere classified: Secondary | ICD-10-CM | POA: Diagnosis not present

## 2019-09-10 DIAGNOSIS — M25552 Pain in left hip: Secondary | ICD-10-CM | POA: Diagnosis not present

## 2019-09-10 DIAGNOSIS — M545 Low back pain: Secondary | ICD-10-CM | POA: Diagnosis not present

## 2019-09-10 DIAGNOSIS — M25551 Pain in right hip: Secondary | ICD-10-CM | POA: Diagnosis not present

## 2019-09-18 DIAGNOSIS — R160 Hepatomegaly, not elsewhere classified: Secondary | ICD-10-CM | POA: Diagnosis not present

## 2019-10-03 ENCOUNTER — Ambulatory Visit: Payer: Medicare HMO | Admitting: Emergency Medicine

## 2019-10-03 DIAGNOSIS — Z20822 Contact with and (suspected) exposure to covid-19: Secondary | ICD-10-CM | POA: Diagnosis not present

## 2019-10-03 DIAGNOSIS — Z01812 Encounter for preprocedural laboratory examination: Secondary | ICD-10-CM | POA: Diagnosis not present

## 2019-10-03 DIAGNOSIS — R160 Hepatomegaly, not elsewhere classified: Secondary | ICD-10-CM | POA: Diagnosis not present

## 2019-10-08 DIAGNOSIS — Z01818 Encounter for other preprocedural examination: Secondary | ICD-10-CM | POA: Diagnosis not present

## 2019-10-08 DIAGNOSIS — K760 Fatty (change of) liver, not elsewhere classified: Secondary | ICD-10-CM | POA: Diagnosis not present

## 2019-10-08 DIAGNOSIS — K759 Inflammatory liver disease, unspecified: Secondary | ICD-10-CM | POA: Diagnosis not present

## 2019-10-08 DIAGNOSIS — Z79899 Other long term (current) drug therapy: Secondary | ICD-10-CM | POA: Diagnosis not present

## 2019-10-08 DIAGNOSIS — K219 Gastro-esophageal reflux disease without esophagitis: Secondary | ICD-10-CM | POA: Diagnosis not present

## 2019-10-08 DIAGNOSIS — K7110 Toxic liver disease with hepatic necrosis, without coma: Secondary | ICD-10-CM | POA: Diagnosis not present

## 2019-10-08 DIAGNOSIS — K753 Granulomatous hepatitis, not elsewhere classified: Secondary | ICD-10-CM | POA: Diagnosis not present

## 2019-10-08 DIAGNOSIS — Z01810 Encounter for preprocedural cardiovascular examination: Secondary | ICD-10-CM | POA: Diagnosis not present

## 2019-10-08 DIAGNOSIS — K769 Liver disease, unspecified: Secondary | ICD-10-CM | POA: Diagnosis not present

## 2019-10-08 DIAGNOSIS — K7689 Other specified diseases of liver: Secondary | ICD-10-CM | POA: Diagnosis not present

## 2019-10-17 DIAGNOSIS — K769 Liver disease, unspecified: Secondary | ICD-10-CM | POA: Diagnosis not present

## 2019-10-17 DIAGNOSIS — K7110 Toxic liver disease with hepatic necrosis, without coma: Secondary | ICD-10-CM | POA: Diagnosis not present

## 2019-10-24 ENCOUNTER — Ambulatory Visit: Payer: Medicare HMO | Admitting: Emergency Medicine

## 2019-10-24 ENCOUNTER — Other Ambulatory Visit: Payer: Self-pay

## 2019-10-24 ENCOUNTER — Encounter: Payer: Self-pay | Admitting: Emergency Medicine

## 2019-10-24 DIAGNOSIS — R05 Cough: Secondary | ICD-10-CM | POA: Diagnosis not present

## 2019-10-24 DIAGNOSIS — D869 Sarcoidosis, unspecified: Secondary | ICD-10-CM | POA: Diagnosis not present

## 2019-10-24 DIAGNOSIS — R053 Chronic cough: Secondary | ICD-10-CM

## 2019-10-24 NOTE — Assessment & Plan Note (Signed)
The diagnosis of sarcoidosis remains in question.  She has granulomas on liver biopsy, all culture negative.  Repeat biopsy has now been performed, surgically.  This should give adequate tissue, information for [place] to get good pathology, culture data.  Depending on results we will decide whether further evaluation for sarcoidosis, particularly pulmonary sarcoidosis is warranted.  I had initially arranged for CT chest in September.  We can discuss the timing of this at her next visit depending on the biopsy data.  Will coordinate any future hepatic imaging with [facility] hepatology.  Plan to follow in June

## 2019-10-24 NOTE — Progress Notes (Signed)
Subjective:    Patient ID: [name], female    DOB: [birth year], 54 y.o.   MRN: [id number]  HPI  ROV 03/19/2019 --this a follow-up visit for 54 year old woman with history of IBS, granulomatous disease initially diagnosed on liver biopsy with necrotizing granulomas.  She has no obstruction on pulmonary function testing.  We have considered the likelihood that this is sarcoidosis and based on that I treated her with prednisone 30 mg for 3 weeks.  She subsequently underwent CT scan of the chest and MRI abdomen that I reviewed.  Again she had some subpleural left lower lobe nodularity in the left major fissure, unchanged.  The MRI abdomen shows persistent multiple T2 hyperintense hypervascular liver lesions, some larger and some smaller.  The spleen also has multiple lesions suggestive of granulomatous disease.  Repeat liver biopsy 7/24 AFB smear and culture negative, PCR not done.  Fungal negative. She has tolerated the pred but w some side effects, sweats, Gi upset.   ROV 10/24/19 --54 year old woman who follows up for presumed sarcoidosis as a diagnosis of exclusion following hepatic biopsy that showed granulomatous in the absence of any positive culture data.  She has normal pulmonary function testing.  I did treat her empirically with 3 weeks of prednisone in 2020 which showed improvement in cough which was her primary resp symptom, stable abdominal imaging (no improvement/ resolution).  Her last CT chest was 03/12/2019.  We had plan to repeat this as well as an MRI abdomen in September. She has been followed by [facility] hepatology, last seen in 08/2019. The etiology of her lever lesions remains in question. She underwent laparoscopic hepatic biopsy 2 weeks ago - path andf cx data are all still pending.   She reports that her breathing is doing OK, although she is having some restriction from her recent abdominal surgery. Her chronic cough remains quiet. Remains on PPI bid, pepcid qhs. She does clear her  throat some. She has some nasal congestion, bothers her more at night. She is scheduled to follow with [facility]atology next month.    Review of Systems  Constitutional: Negative for fever and unexpected weight change.  HENT: Negative for congestion, dental problem, ear pain, nosebleeds, postnasal drip, rhinorrhea, sinus pressure, sneezing, sore throat and trouble swallowing.   Eyes: Negative for redness and itching.  Respiratory: Positive for cough. Negative for chest tightness, shortness of breath and wheezing.   Cardiovascular: Negative for palpitations and leg swelling.  Gastrointestinal: Negative for nausea and vomiting.  Genitourinary: Negative for dysuria.  Musculoskeletal: Negative for joint swelling.  Skin: Negative for rash.  Neurological: Negative for headaches.  Hematological: Does not bruise/bleed easily.  Psychiatric/Behavioral: Negative for dysphoric mood. The patient is not nervous/anxious.     Past Medical History:  Diagnosis Date  . Anemia   . Arthritis   . GERD (gastroesophageal reflux disease)   . 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 Irritable bowel syndrome   . Overweight(278.02)   . Personal history of colonic polyps 09/19/2011   tubular adenoma  . Sarcoidosis 03/20/2019  . Segmental colitis ([place])   . Tubular adenoma of colon      Family History  Problem Relation Age of Onset  . Asthma Mother   . Cancer Mother        gastric tumor  . Glaucoma Mother   . Hyperlipidemia Mother   . COPD Mother   . Stomach cancer Mother   . Esophageal cancer Father  mets to lung and liver  . Hypertension Maternal Aunt   . Colon cancer Neg Hx   . Colon polyps Neg Hx   . Rectal cancer Neg Hx     No hx sarcoidosis in family.   Social History   Socioeconomic History  . Marital status: Divorced    Spouse name: Not on file  . Number of children: 2  . Years of education: Not on file  . Highest education level: Not on file  Occupational History  . Occupation:  Disabled  Tobacco Use  . Smoking status: Never Smoker  . Smokeless tobacco: Never Used  Substance and Sexual Activity  . Alcohol use: No    Alcohol/week: 0.0 standard drinks  . Drug use: No  . Sexual activity: Yes  Other Topics Concern  . Not on file  Social History Narrative   Regular exercise:  No   Caffeine Use:  Occasional sweet tea   She is legally separated   2 children ages 97 year old son and 4 year old daughter.  Both children.   Previously worked in Scientist, research (medical), now disabled.   Completed 2 yrs of college.     Social Determinants of Health   Financial Resource Strain:   . Difficulty of Paying Living Expenses:   Food Insecurity:   . Worried About Charity fundraiser in the Last Year:   . Arboriculturist in the Last Year:   Transportation Needs:   . Film/video editor (Medical):   Marland Kitchen Lack of Transportation (Non-Medical):   Physical Activity:   . Days of Exercise per Week:   . Minutes of Exercise per Session:   Stress:   . Feeling of Stress :   Social Connections:   . Frequency of Communication with Friends and Family:   . Frequency of Social Gatherings with Friends and Family:   . Attends Religious Services:   . Active Member of Clubs or Organizations:   . Attends Archivist Meetings:   Marland Kitchen Marital Status:   Intimate Partner Violence:   . Fear of Current or Ex-Partner:   . Emotionally Abused:   Marland Kitchen Physically Abused:   . Sexually Abused:      No Known Allergies   Outpatient Medications Prior to Visit  Medication Sig Dispense Refill  . dicyclomine (BENTYL) 20 MG tablet TAKE 1 TABLET BY MOUTH THREE TIMES DAILY 90 tablet 8  . famotidine (PEPCID) 40 MG tablet Take 1 tablet (40 mg total) by mouth at bedtime. 30 tablet 11  . hyoscyamine (LEVSIN SL) 0.125 MG SL tablet DISSOLVE 1 TABLET IN MOUTH EVERY 4 HOURS AS NEEDED 30 tablet 5  . methocarbamol (ROBAXIN) 750 MG tablet Take 750 mg by mouth 2 (two) times daily as needed for muscle spasms.    . pantoprazole  (PROTONIX) 40 MG tablet Take 1 tablet by mouth twice daily 60 tablet 5  . traMADol (ULTRAM) 50 MG tablet Take 50 mg by mouth 3 (three) times daily as needed for moderate pain.     Marland Kitchen HYDROcodone-homatropine (HYCODAN) 5-1.5 MG/5ML syrup Take 5 mLs by mouth every 6 (six) hours as needed for cough. 240 mL 0   No facility-administered medications prior to visit.        Objective:   Physical Exam Vitals:   10/24/19 0909  BP: (!) 158/80  Pulse: 95  Temp: (!) 97.5 F (36.4 C)  TempSrc: Temporal  SpO2: 97%  Weight: 202 lb 12.8 oz (92 kg)  Height: 5'  3" (1.6 m)   Gen: Pleasant, overwt woman, in no distress,  normal affect, some throat clearing  ENT: No lesions,  mouth clear,  oropharynx clear, crowded post pharynx, no postnasal drip  Neck: No JVD, no stridor  Lungs: No use of accessory muscles, no crackles or wheezing on normal respiration, no wheeze on forced expiration  Cardiovascular: RRR, heart sounds normal, no murmur or gallops, no peripheral edema  Musculoskeletal: No deformities, no cyanosis or clubbing  Neuro: alert, awake, non focal  Skin: Warm, no lesions or rash      Assessment & Plan:  Sarcoidosis The diagnosis of sarcoidosis remains in question.  She has granulomas on liver biopsy, all culture negative.  Repeat biopsy has now been performed, surgically.  This should give adequate tissue, information for [place] to get good pathology, culture data.  Depending on results we will decide whether further evaluation for sarcoidosis, particularly pulmonary sarcoidosis is warranted.  I had initially arranged for CT chest in September.  We can discuss the timing of this at her next visit depending on the biopsy data.  Will coordinate any future hepatic imaging with [facility] hepatology.  Plan to follow in June  Chronic cough Cough is better but still a lot of throat clearing.  GERD is adequately controlled on maximal medical therapy.  She does have congestion, allergy symptoms.  We will  add nasal steroid to see if this gives her some benefit.   Baltazar Apo, MD, PhD 10/24/2019, 9:28 AM [facility] [phone] or if no answer [phone]

## 2019-10-24 NOTE — Patient Instructions (Addendum)
Await your surgical biopsy results.  Plan to review those results and hepatology notes.  If the ultimate diagnosis here is sarcoidosis then we will plan to follow your chest imaging, pulmonary function testing. We had plan to repeat your CT scan of the chest in September 2021.  We can revisit the timing of this at your next visit depending on your biopsy and culture information. Please start fluticasone nasal spray, 2 sprays each nostril once daily Follow with Dr. Lamonte Sakai in June 2021 to discuss the findings

## 2019-10-24 NOTE — Assessment & Plan Note (Signed)
Cough is better but still a lot of throat clearing.  GERD is adequately controlled on maximal medical therapy.  She does have congestion, allergy symptoms.  We will add nasal steroid to see if this gives her some benefit.

## 2019-11-06 ENCOUNTER — Other Ambulatory Visit: Payer: Self-pay | Admitting: Gastroenterology

## 2019-11-07 DIAGNOSIS — R1011 Right upper quadrant pain: Secondary | ICD-10-CM | POA: Diagnosis not present

## 2019-11-07 DIAGNOSIS — K7110 Toxic liver disease with hepatic necrosis, without coma: Secondary | ICD-10-CM | POA: Diagnosis not present

## 2019-11-15 DIAGNOSIS — K769 Liver disease, unspecified: Secondary | ICD-10-CM | POA: Diagnosis not present

## 2019-11-20 DIAGNOSIS — M2550 Pain in unspecified joint: Secondary | ICD-10-CM | POA: Diagnosis not present

## 2019-11-20 DIAGNOSIS — E559 Vitamin D deficiency, unspecified: Secondary | ICD-10-CM | POA: Diagnosis not present

## 2019-11-20 DIAGNOSIS — M8580 Other specified disorders of bone density and structure, unspecified site: Secondary | ICD-10-CM | POA: Diagnosis not present

## 2019-11-20 DIAGNOSIS — R5383 Other fatigue: Secondary | ICD-10-CM | POA: Diagnosis not present

## 2019-12-15 ENCOUNTER — Other Ambulatory Visit: Payer: Self-pay | Admitting: Gastroenterology

## 2020-01-09 ENCOUNTER — Other Ambulatory Visit: Payer: Self-pay | Admitting: Gastroenterology

## 2020-01-18 ENCOUNTER — Other Ambulatory Visit: Payer: Self-pay | Admitting: Gastroenterology

## 2020-01-21 IMAGING — CT CT CTA ABD/PEL W/CM AND/OR W/O CM
2 of 10 series · 9 of 46 positions shown, 15 images · IV contrast (ISOVUE 370)
Comparison: CT 11/15/2011, ultrasound 08/20/2015

CLINICAL DATA: 52-year-old female with a history of generalized
abdominal cramping for 2 years. Diarrhea

EXAM:
CTA ABDOMEN AND PELVIS wITHOUT AND WITH CONTRAST
TECHNIQUE: Multidetector CT imaging of the abdomen and pelvis was performed
using the standard protocol during bolus administration of
intravenous contrast. Multiplanar reconstructed images and MIPs were
obtained and reviewed to evaluate the vascular anatomy.
CONTRAST:  100mL TGOPU0-VR7 IOPAMIDOL (TGOPU0-VR7) INJECTION 76%

[Series 6: coronals · coronal · 0.67mm/px · 2 of 163 slices shown, 3 images]
[im 55/163  soft-tissue]
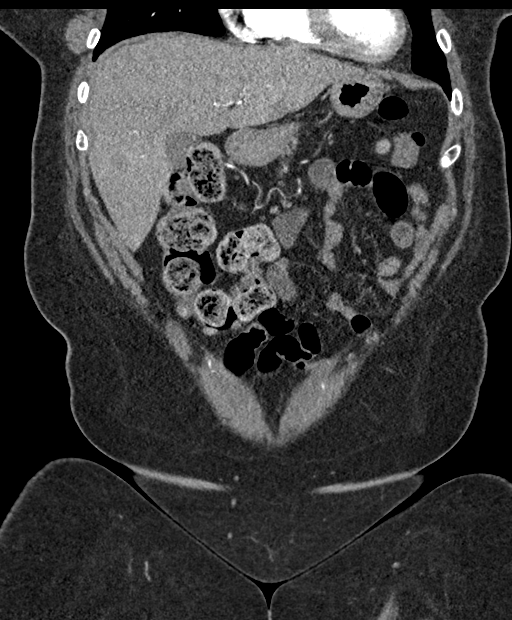
[im 55/163  bone]
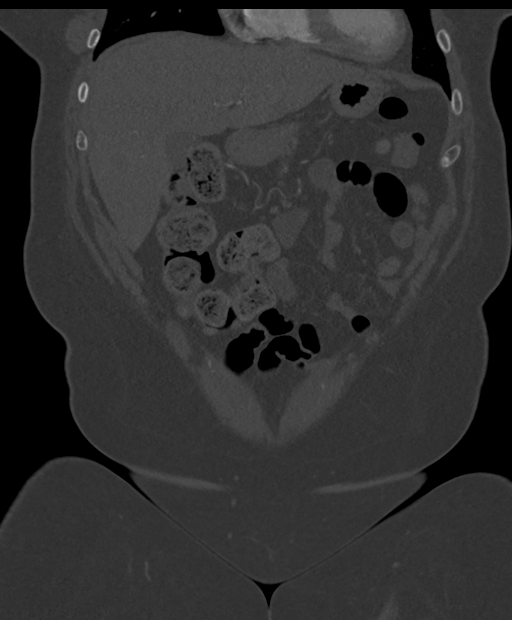
[im 109/163  soft-tissue]
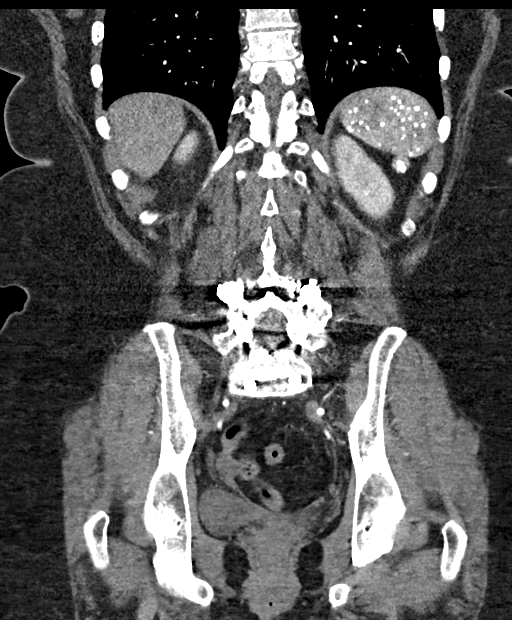

[Series 11: venous 5.0 i31f 2 · axial · portal-venous · 0.98mm/px · z∈[-528,-218]mm · 7 of 84 slices shown, 12 images]
[im 11/84  soft-tissue]
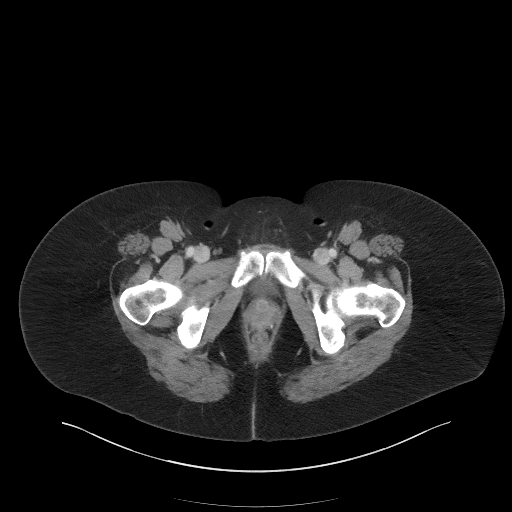
[im 11/84  bone]
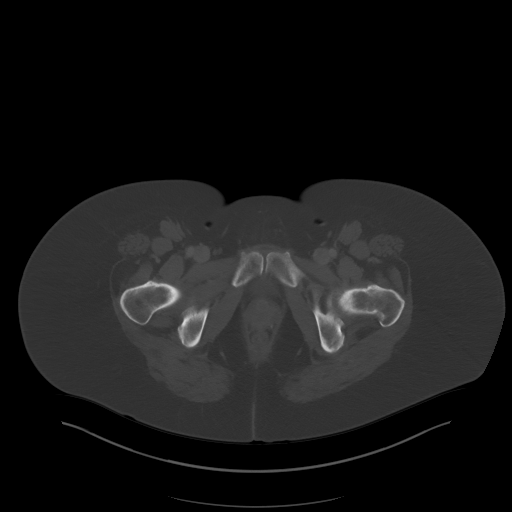
[im 21/84  soft-tissue]
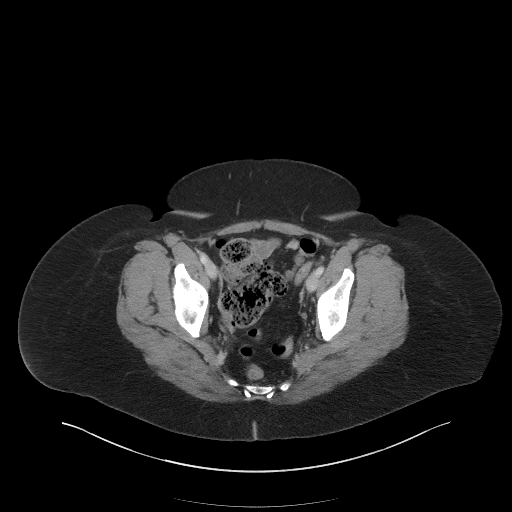
[im 32/84  soft-tissue]
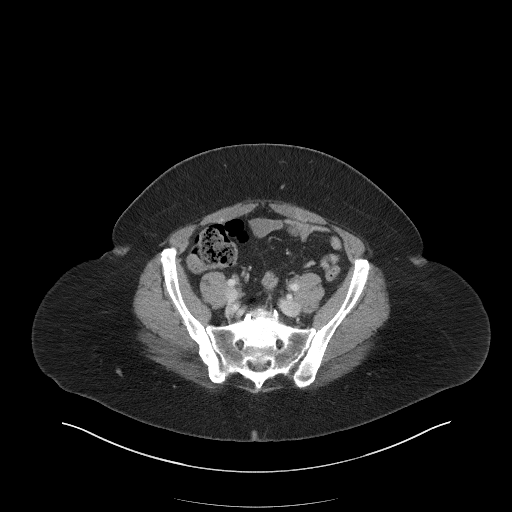
[im 42/84  soft-tissue]
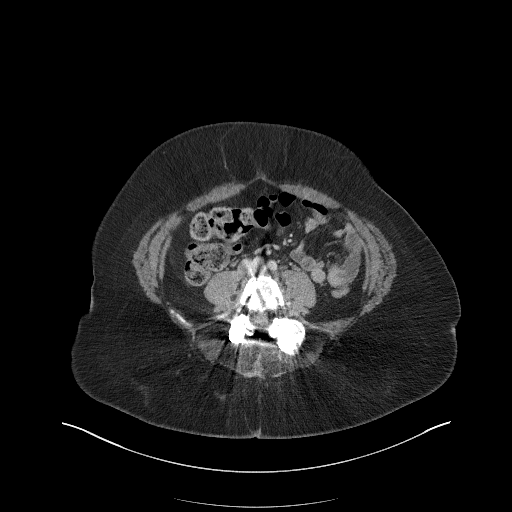
[im 42/84  lung]
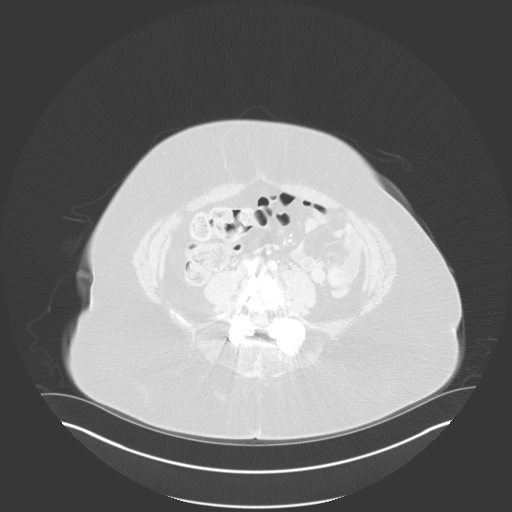
[im 52/84  soft-tissue]
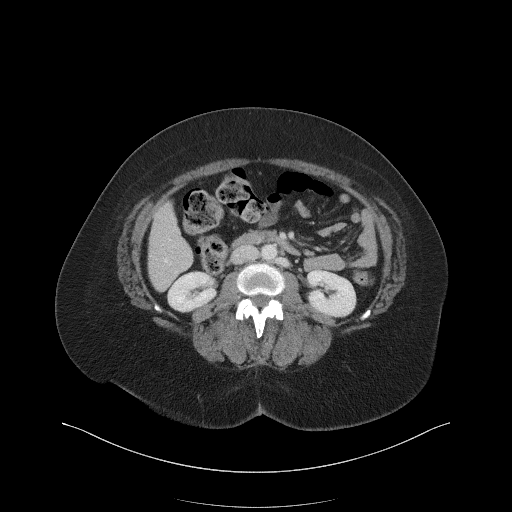
[im 52/84  lung]
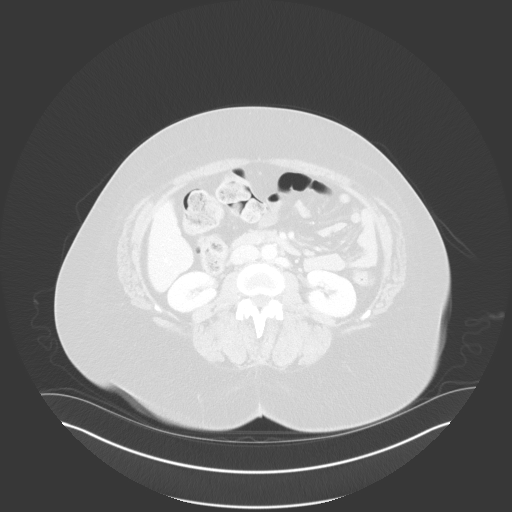
[im 63/84  soft-tissue]
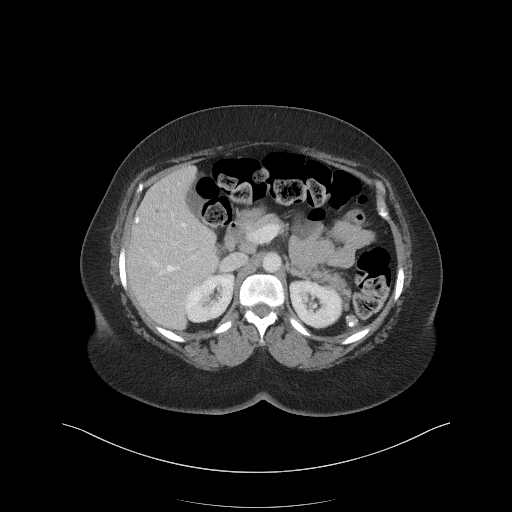
[im 63/84  lung]
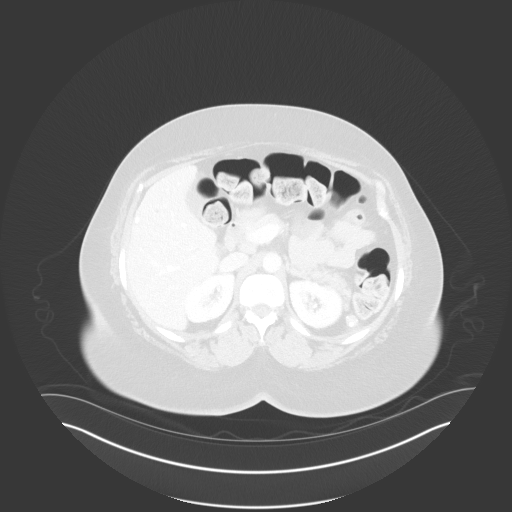
[im 73/84  soft-tissue]
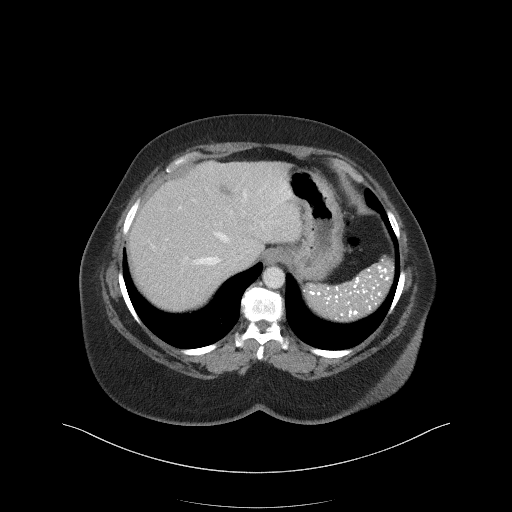
[im 73/84  lung]
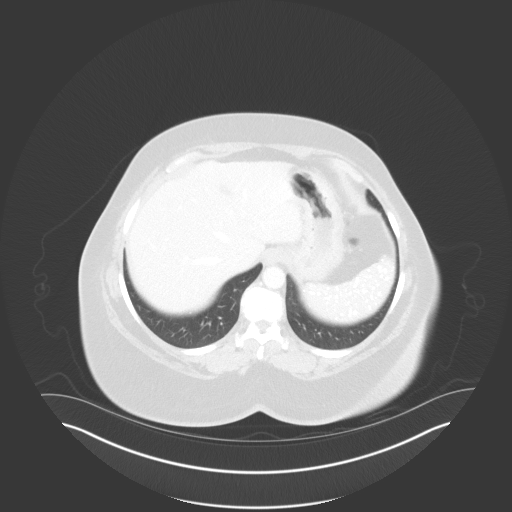

[9 of 46 positions shown; findings below may reference images not displayed]

FINDINGS: VASCULAR

Aorta: Unremarkable course, caliber, contour of the abdominal aorta.
No dissection, aneurysm, or periaortic fluid.

Celiac: Unremarkable appearance of the celiac artery.

SMA: No significant atherosclerotic changes of the superior
mesenteric artery.

Renals: Renal arteries are patent. Single left and single right
renal artery.

IMA: IMA patent

Right lower extremity:

Unremarkable course, caliber, and contour of the right iliac system.
No aneurysm, dissection, or occlusion. Hypogastric artery is patent.
Anterior and posterior division patent. Common femoral artery
patent. Proximal SFA and profunda femoris patent.

Left lower extremity:

Unremarkable course, caliber, and contour of the left iliac system.
No aneurysm, dissection, or occlusion. Hypogastric artery is patent.
Anterior and posterior division patent. Common femoral artery
patent. Proximal SFA and profunda femoris patent.

Veins: Unremarkable appearance of the venous system. Incidental note
made of a retroaortic left renal vein

Review of the MIP images confirms the above findings.

NON-VASCULAR

Lower chest: No acute.

Hepatobiliary: There are multiple low-density/hypoenhancing liver
lesions within right and left liver lobe, which are more pronounced
and more numerous than the comparison CT of 6377. While there are
features of hyperenhancement on the early arterial phase, these do
not enhance in the typical dynamic pattern of a hemangioma which
would be peripheral discontinuous nodularity with centripetal
filling. Some of the lesions demonstrate the suggestion of
hypervascularity/hyperenhancement at the margin, such as the
posterior segment 6 lesion on image 22 of series 4. Nearly all of
these persist in a hypodense/hypoenhancing feature on the late phase
imaging.

Interval growth of at least the segment 4 B lesion on image 22 of
series 4.

Pancreas: Unremarkable pancreas

Spleen: Redemonstration of innumerable small focal calcifications
throughout the splenic parenchyma.

Adrenals/Urinary Tract: Unremarkable adrenal glands.

Right:

No hydronephrosis. Symmetric perfusion to the left. No
nephrolithiasis. Unremarkable course of the right ureter.

Left:

No hydronephrosis. Symmetric perfusion to the right. No
nephrolithiasis. Unremarkable course of the left ureter.

Unremarkable appearance of the urinary bladder .

Stomach/Bowel: Unremarkable appearance of the stomach. Unremarkable
appearance of small bowel. No evidence of obstruction. No colonic
diverticula. Normal appendix.

Lymphatic: No lymphadenopathy. No hyperenhancing lesions within the
mesentery. No free fluid. No reactive changes. No edema/anasarca.

Mesenteric: No free fluid or air. No adenopathy.

Reproductive: Hysterectomy.  Unremarkable appearance of the adnexa.

Other: No hernia.

Musculoskeletal: Surgical changes of the L4-S1 level. No bony canal
narrowing. Degenerative changes of the spine. No acute displaced
fracture.
IMPRESSION: No acute CT finding.

There are multiple hypoenhancing/hypoattenuating liver lesions,
which have increased in number and size since the prior CT. The
overall pattern is concerning for liver metastases, potentially
neuroendocrine malignancy given the patient's history of diarrhea
and the enhancement dynamics. Further evaluation with
contrast-enhanced MRI is recommended, as well as referral for
oncologic evaluation.

Redemonstration of multiple splenic granulomas, most likely
representing prior granulomatous infection.

## 2020-01-23 ENCOUNTER — Other Ambulatory Visit: Payer: Self-pay | Admitting: Gastroenterology

## 2020-02-25 ENCOUNTER — Other Ambulatory Visit: Payer: Self-pay | Admitting: Gastroenterology

## 2020-02-26 DIAGNOSIS — M8580 Other specified disorders of bone density and structure, unspecified site: Secondary | ICD-10-CM | POA: Diagnosis not present

## 2020-02-26 DIAGNOSIS — E559 Vitamin D deficiency, unspecified: Secondary | ICD-10-CM | POA: Diagnosis not present

## 2020-02-26 DIAGNOSIS — M2550 Pain in unspecified joint: Secondary | ICD-10-CM | POA: Diagnosis not present

## 2020-02-26 DIAGNOSIS — R5383 Other fatigue: Secondary | ICD-10-CM | POA: Diagnosis not present

## 2020-02-27 ENCOUNTER — Telehealth: Payer: Self-pay | Admitting: Emergency Medicine

## 2020-02-27 DIAGNOSIS — K7110 Toxic liver disease with hepatic necrosis, without coma: Secondary | ICD-10-CM | POA: Diagnosis not present

## 2020-02-27 NOTE — Telephone Encounter (Signed)
Attempted to call Alexus at [facility] imaging at listed extension, no answer and no option for vm. Per order, the CT chest is to follow up on sarcoidosis and the MRI Abdomen is to follow up on a liver lesion.

## 2020-02-28 ENCOUNTER — Other Ambulatory Visit: Payer: Self-pay | Admitting: Nurse Practitioner

## 2020-02-28 NOTE — Telephone Encounter (Signed)
Called spoke with [name] she states another provider put in with contrast so they are going to do with contrast and CC our practice on the results of the scan.   Nothing further needed at this time.

## 2020-02-28 NOTE — Telephone Encounter (Signed)
If the MR needs to be with contrast that is fine   Previous MR was w/ and w/o so can put in for this as byrum ordered this before  Please let me know what they say

## 2020-02-28 NOTE — Telephone Encounter (Signed)
Alexis called back, she was wondering why the MR of Abdomen wasn't without contrast as they usually do with contrast,, let her know Ashley's message for reasoning, Ubaldo Glassing said I guess it is because of the liver lesion, patient is scheduled tomorrow for CT   Dr. Lamonte Sakai on vacation Tammy Parreet please advise if this is why the abdomen is ordered without contast

## 2020-02-29 ENCOUNTER — Other Ambulatory Visit: Payer: Medicare HMO

## 2020-03-03 ENCOUNTER — Other Ambulatory Visit: Payer: Self-pay | Admitting: Gastroenterology

## 2020-03-03 NOTE — Telephone Encounter (Signed)
Thanks

## 2020-03-06 DIAGNOSIS — R05 Cough: Secondary | ICD-10-CM | POA: Diagnosis not present

## 2020-03-06 DIAGNOSIS — Z20822 Contact with and (suspected) exposure to covid-19: Secondary | ICD-10-CM | POA: Diagnosis not present

## 2020-03-06 DIAGNOSIS — R0981 Nasal congestion: Secondary | ICD-10-CM | POA: Diagnosis not present

## 2020-03-16 ENCOUNTER — Other Ambulatory Visit: Payer: Self-pay | Admitting: Gastroenterology

## 2020-03-24 ENCOUNTER — Telehealth: Payer: Self-pay | Admitting: Gastroenterology

## 2020-03-24 NOTE — Telephone Encounter (Signed)
Patient reports that she has been having diarrhea with eating and drinking despite using her dicyclomine. She will come in and see Nicoletta Ba PA tomorrow at 9:30

## 2020-03-25 ENCOUNTER — Encounter: Payer: Self-pay | Admitting: Physician Assistant

## 2020-03-25 ENCOUNTER — Ambulatory Visit: Admission: RE | Admit: 2020-03-25 | Payer: Medicare HMO | Source: Ambulatory Visit

## 2020-03-25 ENCOUNTER — Ambulatory Visit: Payer: Medicare HMO | Admitting: Physician Assistant

## 2020-03-25 ENCOUNTER — Other Ambulatory Visit: Payer: Self-pay

## 2020-03-25 ENCOUNTER — Other Ambulatory Visit (INDEPENDENT_AMBULATORY_CARE_PROVIDER_SITE_OTHER): Payer: Medicare HMO

## 2020-03-25 VITALS — BP 132/80 | HR 76 | Ht 60.0 in | Wt 203.0 lb

## 2020-03-25 DIAGNOSIS — K580 Irritable bowel syndrome with diarrhea: Secondary | ICD-10-CM

## 2020-03-25 DIAGNOSIS — R1084 Generalized abdominal pain: Secondary | ICD-10-CM

## 2020-03-25 DIAGNOSIS — R109 Unspecified abdominal pain: Secondary | ICD-10-CM | POA: Diagnosis not present

## 2020-03-25 LAB — CBC WITH DIFFERENTIAL/PLATELET
Basophils Absolute: 0 10*3/uL (ref 0.0–0.1)
Basophils Relative: 0.7 % (ref 0.0–3.0)
Eosinophils Absolute: 0.1 10*3/uL (ref 0.0–0.7)
Eosinophils Relative: 2.3 % (ref 0.0–5.0)
HCT: 33.7 % — ABNORMAL LOW (ref 36.0–46.0)
Hemoglobin: 11.2 g/dL — ABNORMAL LOW (ref 12.0–15.0)
Lymphocytes Relative: 35.4 % (ref 12.0–46.0)
Lymphs Abs: 2.2 10*3/uL (ref 0.7–4.0)
MCHC: 33.3 g/dL (ref 30.0–36.0)
MCV: 88.3 fl (ref 78.0–100.0)
Monocytes Absolute: 0.6 10*3/uL (ref 0.1–1.0)
Monocytes Relative: 9.5 % (ref 3.0–12.0)
Neutro Abs: 3.2 10*3/uL (ref 1.4–7.7)
Neutrophils Relative %: 52.1 % (ref 43.0–77.0)
Platelets: 411 10*3/uL — ABNORMAL HIGH (ref 150.0–400.0)
RBC: 3.82 Mil/uL — ABNORMAL LOW (ref 3.87–5.11)
RDW: 12.9 % (ref 11.5–15.5)
WBC: 6.2 10*3/uL (ref 4.0–10.5)

## 2020-03-25 LAB — BASIC METABOLIC PANEL
BUN: 8 mg/dL (ref 6–23)
CO2: 31 mEq/L (ref 19–32)
Calcium: 9.1 mg/dL (ref 8.4–10.5)
Chloride: 104 mEq/L (ref 96–112)
Creatinine, Ser: 0.76 mg/dL (ref 0.40–1.20)
GFR: 96.52 mL/min (ref >60.00)
Glucose, Bld: 88 mg/dL (ref 70–99)
Potassium: 3.2 mEq/L — ABNORMAL LOW (ref 3.5–5.1)
Sodium: 141 mEq/L (ref 135–145)

## 2020-03-25 LAB — C-REACTIVE PROTEIN: CRP: 1.4 mg/dL (ref 0.5–20.0)

## 2020-03-25 LAB — SEDIMENTATION RATE: Sed Rate: 63 mm/hr — ABNORMAL HIGH (ref 0–30)

## 2020-03-25 MED ORDER — POTASSIUM CHLORIDE ER 10 MEQ PO TBCR: 20.0000 meq | EXTENDED_RELEASE_TABLET | Freq: Two times a day (BID) | ORAL | 0 refills | Status: DC

## 2020-03-25 MED ORDER — HYOSCYAMINE SULFATE 0.125 MG SL SUBL: SUBLINGUAL_TABLET | SUBLINGUAL | 11 refills | Status: DC

## 2020-03-25 MED ORDER — DICYCLOMINE HCL 20 MG PO TABS: 20.0000 mg | ORAL_TABLET | Freq: Three times a day (TID) | ORAL | 11 refills | Status: DC

## 2020-03-25 MED ORDER — RIFAXIMIN 550 MG PO TABS: 550.0000 mg | ORAL_TABLET | Freq: Three times a day (TID) | ORAL | 0 refills | Status: AC

## 2020-03-25 NOTE — Progress Notes (Signed)
Subjective:    Patient ID: [name], female    DOB: 1965/09/25, 54 y.o.   MRN: [id number]  HPI [name] is a pleasant 54 year old African-American female established with Dr. Fuller Plan.  She has long history of IBS-D, also with GERD/LPR, fibromyalgia, chronic pain syndrome, probable sarcoidosis and history of necrotizing hepatic granulomas for which she is being followed by [facility]. Last colonoscopy October 2019 normal exam.  EGD in March 2020 normal. She comes in today stating that she has been having issues with diarrhea over the past week and a half.  She thought she was getting better and then symptoms recurred.  She is complaining of associated abdominal cramping.  Generally just having 1-2 diarrheal stools per day but says when she has these episodes she may pass a lot of stool and then feels "exhausted" afterwards.  She is usually having to lie down and put a heating pad on her abdomen etc.  No associated fever or chills, no nausea or vomiting.  No melena or hematochezia.  She does feel that symptoms are consistent with her IBS flareups.  She has been taking dicyclomine 20 mg p.o. 3 times a day on a regular basis, usually with good control of symptoms.  No recent antibiotics, no new meds.  She has been feeling stressed recently. She says she has not been able to eat much over the past week or so primarily eating soup ginger ale etc.  Review of Systems Pertinent positive and negative review of systems were noted in the above HPI section.  All other review of systems was otherwise negative.  Outpatient Encounter Medications as of 03/25/2020  Medication Sig  . dicyclomine (BENTYL) 20 MG tablet Take 1 tablet (20 mg total) by mouth 3 (three) times daily before meals.  . famotidine (PEPCID) 40 MG tablet Take 1 tablet (40 mg total) by mouth at bedtime.  . gabapentin (NEURONTIN) 100 MG capsule Take 100 mg by mouth at bedtime.  . hyoscyamine (LEVSIN SL) 0.125 MG SL tablet Dissolve 1 tablet  in mouth every 4 mouth hours as needed  . methocarbamol (ROBAXIN) 750 MG tablet Take 750 mg by mouth 2 (two) times daily as needed for muscle spasms.  . pantoprazole (PROTONIX) 40 MG tablet Take 1 tablet by mouth twice daily  . traMADol (ULTRAM) 50 MG tablet Take 50 mg by mouth 3 (three) times daily as needed for moderate pain.   . [DISCONTINUED] dicyclomine (BENTYL) 20 MG tablet TAKE 1 TABLET BY MOUTH THREE TIMES DAILY . APPOINTMENT REQUIRED FOR FUTURE REFILLS  . [DISCONTINUED] hyoscyamine (LEVSIN SL) 0.125 MG SL tablet DISSOLVE 1 TABLET IN MOUTH EVERY 4 HOURS AS NEEDED  . rifaximin (XIFAXAN) 550 MG TABS tablet Take 1 tablet (550 mg total) by mouth 3 (three) times daily for 14 days.  . [DISCONTINUED] omeprazole (PRILOSEC) 20 MG capsule Take 20 mg by mouth daily.   No facility-administered encounter medications on file as of 03/25/2020.   No Known Allergies Patient Active Problem List   Diagnosis Date Noted  . Sarcoidosis 03/20/2019  . RUQ abdominal pain 03/07/2019  . Chronic cough 11/27/2018  . 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 Pain in joint of left elbow 08/29/2017  . Pain in joint of right elbow 08/29/2017  . Pain of left shoulder joint on movement 08/29/2017  . Hx of adenomatous colonic polyps 11/14/2014  . Chronic pain syndrome 09/04/2013  . Epigastric abdominal tenderness 08/16/2013  . Elbow pain, right 06/30/2013  . Fibromyalgia 04/19/2012  .  IBS (irritable bowel syndrome) 04/19/2012  . Joint pain 04/19/2012  . Benign neoplasm of colon 09/19/2011  . Colitis, nonspecific 09/19/2011  . Hematochezia 09/19/2011  . Abdominal pain, other specified site 09/08/2011  . GERD (gastroesophageal reflux disease) 09/08/2011  . General medical examination 04/13/2011  . Cervical disc disease 03/15/2011  . Multiple thyroid nodules 02/14/2011   Social History   Socioeconomic History  . Marital status: Divorced    Spouse name: Not on file  . Number of children: 2  . Years of education:  Not on file  . Highest education level: Not on file  Occupational History  . Occupation: Disabled  Tobacco Use  . Smoking status: Never Smoker  . Smokeless tobacco: Never Used  Vaping Use  . Vaping Use: Never used  Substance and Sexual Activity  . Alcohol use: No    Alcohol/week: 0.0 standard drinks  . Drug use: No  . Sexual activity: Yes  Other Topics Concern  . Not on file  Social History Narrative   Regular exercise:  No   Caffeine Use:  Occasional sweet tea   She is legally separated   2 children ages 20 year old son and 52 year old daughter.  Both children.   Previously worked in Scientist, research (medical), now disabled.   Completed 2 yrs of college.     Social Determinants of Health   Financial Resource Strain:   . Difficulty of Paying Living Expenses: Not on file  Food Insecurity:   . Worried About Charity fundraiser in the Last Year: Not on file  . Ran Out of Food in the Last Year: Not on file  Transportation Needs:   . Lack of Transportation (Medical): Not on file  . Lack of Transportation (Non-Medical): Not on file  Physical Activity:   . Days of Exercise per Week: Not on file  . Minutes of Exercise per Session: Not on file  Stress:   . Feeling of Stress : Not on file  Social Connections:   . Frequency of Communication with Friends and Family: Not on file  . Frequency of Social Gatherings with Friends and Family: Not on file  . Attends Religious Services: Not on file  . Active Member of Clubs or Organizations: Not on file  . Attends Archivist Meetings: Not on file  . Marital Status: Not on file  Intimate Partner Violence:   . Fear of Current or Ex-Partner: Not on file  . Emotionally Abused: Not on file  . Physically Abused: Not on file  . Sexually Abused: Not on file    [name]'s family history includes Asthma in her mother; COPD in her mother; Cancer in her mother; Esophageal cancer in her father; Glaucoma in her mother; Hyperlipidemia in her mother;  Hypertension in her maternal aunt; Stomach cancer in her mother.      Objective:    Vitals:   03/25/20 0927  BP: 132/80  Pulse: 76    Physical Exam Well-developed well-nourished AA female  in no acute distress.  Height, Weight203, BMI39  HEENT; nontraumatic normocephalic, EOMI, PER R LA, sclera anicteric. Oropharynx; not examined Neck; supple, no JVD Cardiovascular; regular rate and rhythm with S1-S2, no murmur rub or gallop Pulmonary; Clear bilaterally Abdomen; soft, she has some mild tenderness bilateral lower quadrants no guarding or rebound, no palpable mass or hepatosplenomegaly, bowel sounds are active Rectal; not done today Skin; benign exam, no jaundice rash or appreciable lesions Extremities; no clubbing cyanosis or edema skin warm and  dry Neuro/Psych; alert and oriented x4, grossly nonfocal mood and affect appropriate       Assessment & Plan:   #14 54 year old female with history of IBS D, generally requiring dicyclomine 3 times daily who comes in with 1-1/2-week history of episodes of abdominal cramping and diarrhea occurring once to twice daily despite regular use of dicyclomine.  These episodes are uncomfortable and associated with fatigue. Suspect IBS D exacerbation, doubt infectious by history.  Rule out superimposed SIBO.  #2 possible sarcoidosis-patient has necrotizing hepatic granulomas by history, followed by [facility].  She is actually scheduled for MR I of the abdomen this week. 3.  Chronic pain syndrome 4.  Fibromyalgia 5.  GERD/LPR stable  Plan; CBC with differential, sed rate, CRP, be met Continue dicyclomine 20 mg p.o. 3 times daily, refill sent Levsin sublingual, take 1 p.o. every morning then repeat in 4 hours.  Patient was advised to take this more scheduled over the next couple of weeks until symptoms improve. Also discussed an empiric course of Xifaxan for possible SIBO and she would like to proceed.  Have sent prescription for Xifaxan 550  mg p.o. 3 times daily x14 days. Will follow up on abdominal MRI. Have asked patient to call back if symptoms have not significantly improved over the next week or so.  Hoyle Barkdull Genia Harold PA-C 03/25/2020   Cc: Aretta Nip, MD

## 2020-03-25 NOTE — Progress Notes (Signed)
Reviewed and agree with management plan.  Daviona Herbert T. Ethne Jeon, MD FACG [facility]  

## 2020-03-25 NOTE — Patient Instructions (Signed)
If you are age 52 or older, your body mass index should be between 23-30. Your Body mass index is 39.65 kg/m. If this is out of the aforementioned range listed, please consider follow up with your Primary Care Provider.  If you are age 78 or younger, your body mass index should be between 19-25. Your Body mass index is 39.65 kg/m. If this is out of the aformentioned range listed, please consider follow up with your Primary Care Provider.   Your provider has requested that you go to the basement level for lab work before leaving today. Press "B" on the elevator. The lab is located at the first door on the left as you exit the elevator.  Your Bentyl and Levsin have been refilled and sent to your local pharmacy.  For now take your Levsin every morning and repeat every 4 hours.  Xifaxin 550 mg has been sent to [facility] and will be verified with your insurance. They will contact you with the cost and and shipment verification. You will take this 3 times a day for 14 days.  Follow up as needed

## 2020-04-08 ENCOUNTER — Other Ambulatory Visit: Payer: Self-pay | Admitting: Gastroenterology

## 2020-04-19 ENCOUNTER — Other Ambulatory Visit: Payer: Self-pay | Admitting: Gastroenterology

## 2020-04-30 ENCOUNTER — Encounter: Payer: Self-pay | Admitting: Gastroenterology

## 2020-04-30 ENCOUNTER — Ambulatory Visit (INDEPENDENT_AMBULATORY_CARE_PROVIDER_SITE_OTHER): Payer: Medicare HMO | Admitting: Gastroenterology

## 2020-04-30 VITALS — BP 144/88 | HR 86 | Ht 63.0 in | Wt 196.1 lb

## 2020-04-30 DIAGNOSIS — K580 Irritable bowel syndrome with diarrhea: Secondary | ICD-10-CM | POA: Diagnosis not present

## 2020-04-30 DIAGNOSIS — K219 Gastro-esophageal reflux disease without esophagitis: Secondary | ICD-10-CM | POA: Diagnosis not present

## 2020-04-30 MED ORDER — HYOSCYAMINE SULFATE 0.125 MG SL SUBL: SUBLINGUAL_TABLET | SUBLINGUAL | 11 refills | Status: DC

## 2020-04-30 NOTE — Progress Notes (Signed)
    History of Present Illness: This is a 54 year old female with IBS-D complaining of increased diarrhea and abdominal cramping for the past day. Symptoms improved since her last office visit with the addition of Levsin prn.  She has necrotizing hepatic granulomata and she has had an extensive evaluation through [facility].  She has been evaluated by pulmonary for possible sarcoidosis although that typically presents with nonnecrotizing granulomas and her ACE level has been normal.  She was treated empirically with a 14-day course of Xifaxan without clear improvement in symptoms.  Blood work included a sed rate of 63. An abdominal MRI was ordered but has not been completed.  Current Medications, Allergies, Past Medical History, Past Surgical History, Family History and Social History were reviewed in [employer] record.   Physical Exam: General: Well developed, well nourished, no acute distress Head: Normocephalic and atraumatic Eyes:  sclerae anicteric, EOMI Ears: Normal auditory acuity Mouth: Not examined, mask on during Covid-19 pandemic Lungs: Clear throughout to auscultation Heart: Regular rate and rhythm; no murmurs, rubs or bruits Abdomen: Soft, non tender and non distended. No masses, hepatosplenomegaly or hernias noted. Normal Bowel sounds Rectal: Not done Musculoskeletal: Symmetrical with no gross deformities  Pulses:  Normal pulses noted Extremities: No clubbing, cyanosis, edema or deformities noted Neurological: Alert oriented x 4, grossly nonfocal Psychological:  Alert and cooperative. Normal mood and affect   Assessment and Recommendations:  1. IBS-D.  Continue dicyclomine 20 mg 3 times daily.  Increase hyoscyamine to 1-2 p.o. every 4 hours as needed.  Avoid foods and beverages that trigger symptoms. REV in 1 year.  2. GERD.  Follow antireflux measures and continue pantoprazole 40 mg p.o. twice daily. REV in 1 year.   3.  Necrotizing  hepatic granulomata, etiology unclear after an extensive evaluation.  Abdominal MRI scheduled.  Ongoing  follow-up with [facility].

## 2020-04-30 NOTE — Patient Instructions (Signed)
Increase your hyosocamine to 1-2 tablets dissolved under tongue every 4 hours. A new prescription has been sent to your pharmacy.   Continue dicyclomine.   Thank you for choosing me and [facility].  Pricilla Riffle. Dagoberto Ligas., MD., [facility]

## 2020-05-27 ENCOUNTER — Other Ambulatory Visit: Payer: Self-pay | Admitting: Gastroenterology

## 2020-06-11 DIAGNOSIS — K7110 Toxic liver disease with hepatic necrosis, without coma: Secondary | ICD-10-CM | POA: Diagnosis not present

## 2020-06-15 ENCOUNTER — Other Ambulatory Visit: Payer: Self-pay | Admitting: Nurse Practitioner

## 2020-06-15 DIAGNOSIS — K7110 Toxic liver disease with hepatic necrosis, without coma: Secondary | ICD-10-CM

## 2020-07-07 IMAGING — CR CHEST - 2 VIEW
2 series · 2 of 2 positions shown · non-contrast
Comparison: 06/08/2018

CLINICAL DATA: Cough for several weeks

EXAM:
CHEST - 2 VIEW

[w chest pa]
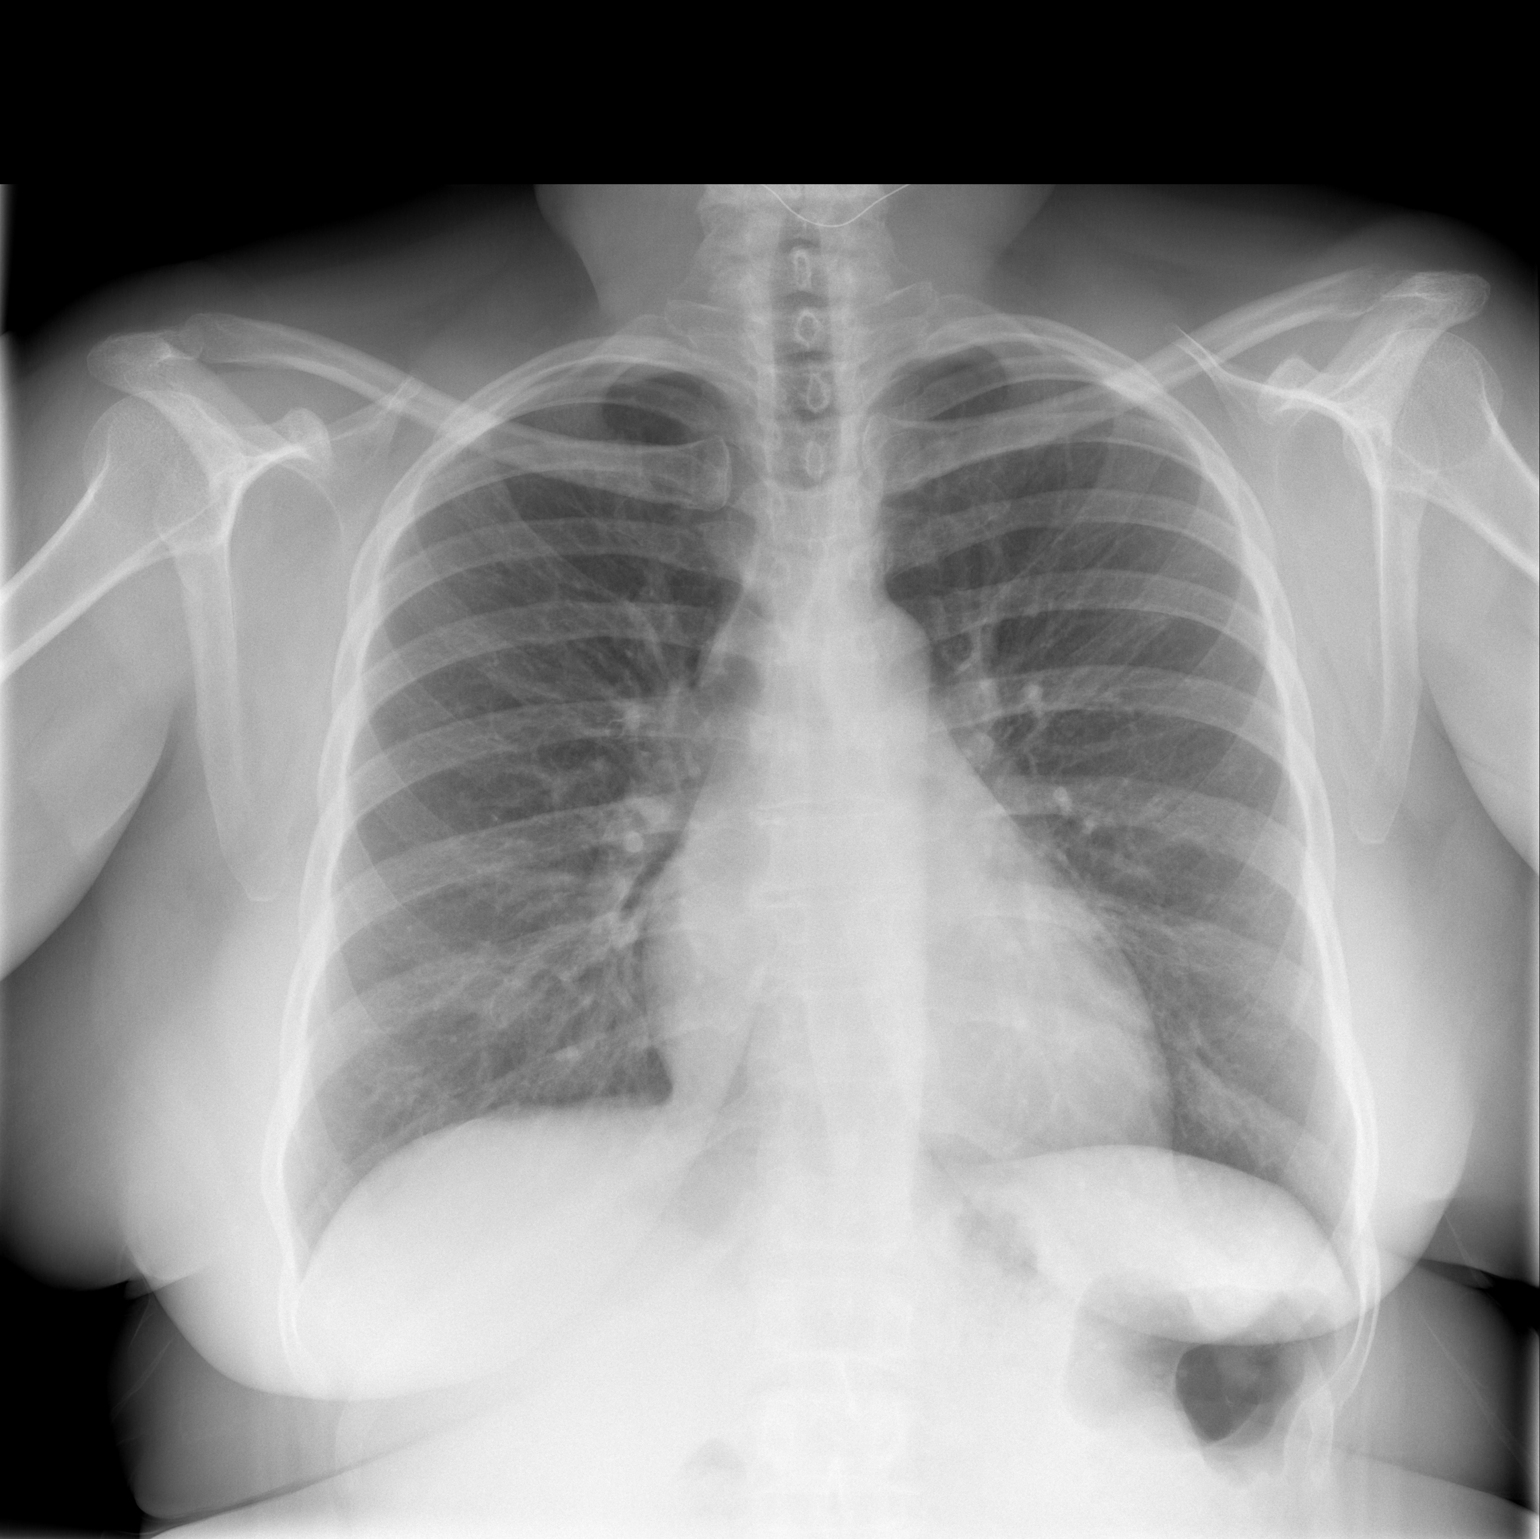

[w chest lat]
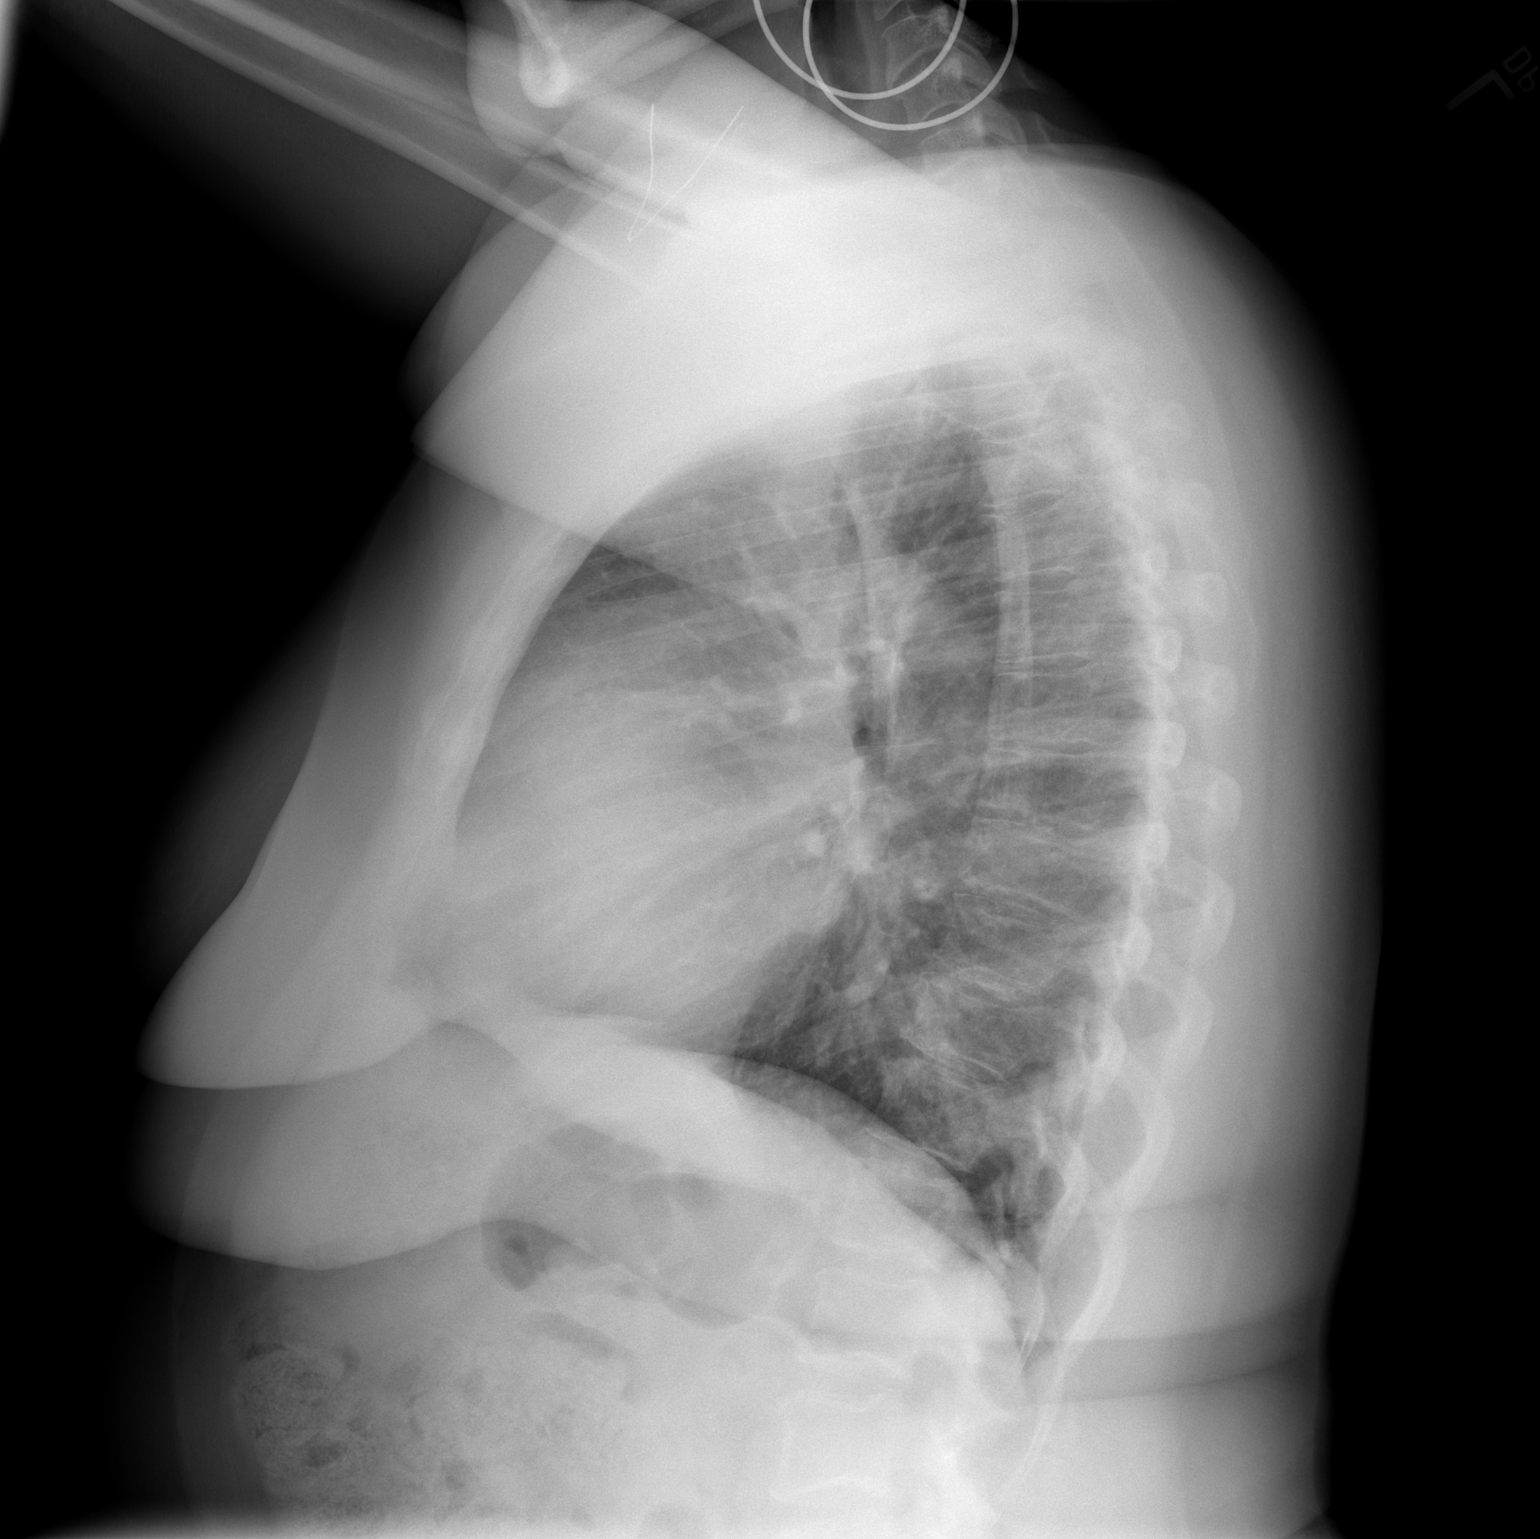

[2 of 2 positions shown; findings below may reference images not displayed]

FINDINGS: Normal heart size. Lungs clear. No pneumothorax. No pleural
effusion.
IMPRESSION: No active cardiopulmonary disease.

## 2020-07-11 ENCOUNTER — Other Ambulatory Visit: Payer: Medicare HMO

## 2020-07-29 ENCOUNTER — Other Ambulatory Visit: Payer: Self-pay

## 2020-07-29 ENCOUNTER — Other Ambulatory Visit: Payer: Self-pay | Admitting: Nurse Practitioner

## 2020-07-29 ENCOUNTER — Ambulatory Visit
Admission: RE | Admit: 2020-07-29 | Discharge: 2020-07-29 | Disposition: A | Discharge status: Home / Self Care | Payer: Medicare HMO | Source: Ambulatory Visit | Attending: Nurse Practitioner | Admitting: Nurse Practitioner

## 2020-07-29 DIAGNOSIS — M545 Low back pain, unspecified: Secondary | ICD-10-CM | POA: Diagnosis not present

## 2020-07-29 DIAGNOSIS — D71 Functional disorders of polymorphonuclear neutrophils: Secondary | ICD-10-CM | POA: Diagnosis not present

## 2020-07-29 DIAGNOSIS — K7110 Toxic liver disease with hepatic necrosis, without coma: Secondary | ICD-10-CM

## 2020-07-29 DIAGNOSIS — K7689 Other specified diseases of liver: Secondary | ICD-10-CM | POA: Diagnosis not present

## 2020-07-29 MED ORDER — GADOXETATE DISODIUM 0.25 MMOL/ML IV SOLN
10.0000 mL | Freq: Once | INTRAVENOUS | Status: AC | PRN
  Administered 2020-07-29: 10 mL via INTRAVENOUS

## 2020-09-10 ENCOUNTER — Other Ambulatory Visit: Payer: Self-pay | Admitting: Gastroenterology

## 2020-10-26 DIAGNOSIS — M545 Low back pain, unspecified: Secondary | ICD-10-CM | POA: Diagnosis not present

## 2020-10-28 ENCOUNTER — Other Ambulatory Visit: Payer: Self-pay | Admitting: Surgery

## 2020-10-28 DIAGNOSIS — K769 Liver disease, unspecified: Secondary | ICD-10-CM

## 2020-11-09 ENCOUNTER — Other Ambulatory Visit: Payer: Medicare HMO

## 2020-11-10 ENCOUNTER — Ambulatory Visit
Admission: RE | Admit: 2020-11-10 | Discharge: 2020-11-10 | Disposition: A | Discharge status: Home / Self Care | Payer: Medicare HMO | Source: Ambulatory Visit | Attending: Surgery | Admitting: Surgery

## 2020-11-10 DIAGNOSIS — K7689 Other specified diseases of liver: Secondary | ICD-10-CM | POA: Diagnosis not present

## 2020-11-10 DIAGNOSIS — K769 Liver disease, unspecified: Secondary | ICD-10-CM

## 2020-11-10 MED ORDER — IOPAMIDOL (ISOVUE-300) INJECTION 61%
100.0000 mL | Freq: Once | INTRAVENOUS | Status: AC | PRN
  Administered 2020-11-10: 100 mL via INTRAVENOUS

## 2020-11-13 DIAGNOSIS — K753 Granulomatous hepatitis, not elsewhere classified: Secondary | ICD-10-CM | POA: Diagnosis not present

## 2020-11-13 DIAGNOSIS — K769 Liver disease, unspecified: Secondary | ICD-10-CM | POA: Diagnosis not present

## 2020-11-13 DIAGNOSIS — Z0000 Encounter for general adult medical examination without abnormal findings: Secondary | ICD-10-CM | POA: Diagnosis not present

## 2021-01-07 ENCOUNTER — Telehealth: Payer: Self-pay | Admitting: Gastroenterology

## 2021-01-07 NOTE — Telephone Encounter (Signed)
Spoke with patient, she states that she has been experiencing more frequent IBS-D flares. Patient denies any new changes to her diet or medications. She states that she had 2 flares last week and Sunday morning as well. She states that the flares have been coming every couple of days. She states that her stools will start out hard followed by watery diarrhea, she states that she will get sweats as well. She states that she spends a long time in the restroom trying have a BM. She reports 1 large BM today that was soft. She has continued to take Dicyclomine TID, pantoprazole and Levsin PRN. Patient advised to try a clear liquid diet until her stomach settles. Patient advised to continue medications and stay hydrated until her appt. Patient has been scheduled to see Carl Best at 11 am tomorrow. Patient verbalized understanding and had no concerns at the end of the call.

## 2021-01-07 NOTE — Telephone Encounter (Signed)
Pt calling to inform she is experiencing IBS flare up//diarrhea//abd pain. Pt wants to know how to treat sxs.. Plz advise thank you

## 2021-01-08 ENCOUNTER — Ambulatory Visit: Payer: Medicare HMO | Admitting: Nurse Practitioner

## 2021-01-08 NOTE — Progress Notes (Deleted)
     01/08/2021 [name] [id number] 12/04/1965   Chief Complaint:  History of Present Illness: [name] is a 55 year old female with a past medial history of arthritis, questionable sarcoidosis, a necrotizing hepatic granulomata, anemia, colitis and colon polyps.  EGD 08/27/2018: Normal esophagus. - Normal stomach. - Normal examined duodenum. - No specimens collected.  Colonoscopy 04/09/2018: The entire examined colon is normal on direct and retroflexion views. - No specimens collected. -5 year recall     Current Medications, Allergies, Past Medical History, Past Surgical History, Family History and Social History were reviewed in [employer] record.   Review of Systems:   Constitutional: Negative for fever, sweats, chills or weight loss.  Respiratory: Negative for shortness of breath.   Cardiovascular: Negative for chest pain, palpitations and leg swelling.  Gastrointestinal: See HPI.  Musculoskeletal: Negative for back pain or muscle aches.  Neurological: Negative for dizziness, headaches or paresthesias.    Physical Exam: LMP 02/25/2009  General: Well developed, w   ***female in no acute distress. Head: Normocephalic and atraumatic. Eyes: No scleral icterus. Conjunctiva pink . Ears: Normal auditory acuity. Mouth: Dentition intact. No ulcers or lesions.  Lungs: Clear throughout to auscultation. Heart: Regular rate and rhythm, no murmur. Abdomen: Soft, nontender and nondistended. No masses or hepatomegaly. Normal bowel sounds x 4 quadrants.  Rectal: *** Musculoskeletal: Symmetrical with no gross deformities. Extremities: No edema. Neurological: Alert oriented x 4. No focal deficits.  Psychological: Alert and cooperative. Normal mood and affect  Assessment and Recommendations:  1.  IBS-D  2.  GERD  3. Necrotizing hepatic granulomata, etiology unclear after an extensive evaluation.  Ongoing  follow-up with [facility].

## 2021-01-25 DIAGNOSIS — M5416 Radiculopathy, lumbar region: Secondary | ICD-10-CM | POA: Diagnosis not present

## 2021-01-25 DIAGNOSIS — M461 Sacroiliitis, not elsewhere classified: Secondary | ICD-10-CM | POA: Diagnosis not present

## 2021-02-05 ENCOUNTER — Ambulatory Visit: Payer: Medicare HMO | Admitting: Physician Assistant

## 2021-02-05 ENCOUNTER — Encounter: Payer: Self-pay | Admitting: Physician Assistant

## 2021-02-05 VITALS — BP 138/74 | HR 78 | Ht 62.0 in | Wt 187.0 lb

## 2021-02-05 DIAGNOSIS — K580 Irritable bowel syndrome with diarrhea: Secondary | ICD-10-CM

## 2021-02-05 NOTE — Patient Instructions (Signed)
Increase Dicyclomine to four times daily 20-30 minutes before meals and at bedtime.  Start Benefiber or Citrucel 2 teaspoons in 8 ounces of liquid daily and may increase to twice daily if tolerated.   Start Align probiotic daily.   If you are age 53 or older, your body mass index should be between 23-30. Your Body mass index is 34.2 kg/m. If this is out of the aforementioned range listed, please consider follow up with your Primary Care Provider.  If you are age 54 or younger, your body mass index should be between 19-25. Your Body mass index is 34.2 kg/m. If this is out of the aformentioned range listed, please consider follow up with your Primary Care Provider.   __________________________________________________________  The [facility] providers would like to encourage you to use [facility] to communicate with providers for non-urgent requests or questions.  Due to long hold times on the telephone, sending your provider a message by [facility] may be a faster and more efficient way to get a response.  Please allow 48 business hours for a response.  Please remember that this is for non-urgent requests.

## 2021-02-05 NOTE — Progress Notes (Signed)
Chief Complaint: IBS-D  HPI:    [name] is a 55 year old female with a past medical history as listed below including reflux and IBS-D and necrotizing hepatic granulomata with extensive eval through [facility], known to Dr. Fuller Plan, who was referred to me by Rankins, Bill Salinas, MD for a complaint of IBS-D.    04/09/2018 colonoscopy normal.  Repeat recommended in 5 years for previous history of adenomatous polyps.    04/30/2020 patient seen in clinic by Dr. Fuller Plan.  At that time described increased diarrhea abdominal cramping.  Symptoms had gotten some better with the addition of Levsin as needed.  Had recently been treated with a 14-day course of Xifaxan without clear improvement in symptoms.  At that time continued on dicyclomine 20 mg 3 times daily and told to increase hyoscyamine 1-2 p.o. every 4 hours as needed.  Told to avoid foods and beverages that trigger symptoms.  Also continued on Pantoprazole 40 mg twice daily.    Today, the patient tells me that she has had an increase in IBS episodes continuing to describe them with a lot of abdominal cramping which sometimes makes her go to the bathroom but she cannot have a stool and then will continue cramping and eventually have watery diarrhea.  Tells me during these episodes she has diaphoresis and afterwards feels very fatigued.  She is now having about 2 episodes a week and this is regardless of really just being on a soup diet and trying to avoid anything of substance and that will cause problems.  When she is not having these episodes she does have some formed stool and is fairly normal.  Continues to take her Dicyclomine 20 mg 3 times daily and typically uses Levsin 1 tab 4 times a day in between.  Does admit to a lot of stress and anxiety in her life, "I have a lot on me".    Denies fever, chills, weight loss, blood in her stool or symptoms that awaken her from sleep.  Past Medical History:  Diagnosis Date   Anemia    Arthritis     GERD (gastroesophageal reflux disease)    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   Irritable bowel syndrome    Overweight(278.02)    Personal history of colonic polyps 09/19/2011   tubular adenoma   Sarcoidosis 03/20/2019   Segmental colitis ([place])    Tubular adenoma of colon     Past Surgical History:  Procedure Laterality Date   COLONOSCOPY     FACET JOINT INJECTION  03/28/11   injections in neck   hysterectomy  2000   IR SACROPLASTY BILATERAL  06/18/2019   KNEE SURGERY Right     Right knee arthroscopic partial lateral meniscectomy (01/22/07),arthroscopic partial lateral meniscectomy 5/09   POLYPECTOMY     SPINE SURGERY  10/13/06   Posterior interbody fusion (PLIF) L5-S1 (07/04/07)   WRIST FRACTURE SURGERY Left 2011   Due to injury at work in 2008.    Current Outpatient Medications  Medication Sig Dispense Refill   dicyclomine (BENTYL) 20 MG tablet One tablet by mouth three times daily 90 tablet 11   gabapentin (NEURONTIN) 100 MG capsule Take 100 mg by mouth at bedtime.     hyoscyamine (LEVSIN SL) 0.125 MG SL tablet DISSOLVE 1-2 TABLET IN MOUTH EVERY 4 HOURS 360 tablet 11   methocarbamol (ROBAXIN) 750 MG tablet Take 750 mg by mouth 2 (two) times daily as needed for muscle spasms.     pantoprazole (PROTONIX) 40  MG tablet TAKE 1 TABLET BY MOUTH TWICE DAILY . APPOINTMENT REQUIRED FOR FUTURE REFILLS 60 tablet 7   traMADol (ULTRAM) 50 MG tablet Take 50 mg by mouth 3 (three) times daily as needed for moderate pain.      No current facility-administered medications for this visit.    Allergies as of 02/05/2021   (No Known Allergies)    Family History  Problem Relation Age of Onset   Asthma Mother    Cancer Mother        gastric tumor   Glaucoma Mother    Hyperlipidemia Mother    COPD Mother    Stomach cancer Mother    Esophageal cancer Father        mets to lung and liver   Hypertension Maternal Aunt    Colon cancer Neg Hx    Colon polyps Neg Hx    Rectal cancer Neg Hx      Social History   Socioeconomic History   Marital status: Divorced    Spouse name: Not on file   Number of children: 2   Years of education: Not on file   Highest education level: Not on file  Occupational History   Occupation: Disabled  Tobacco Use   Smoking status: Never   Smokeless tobacco: Never  Vaping Use   Vaping Use: Never used  Substance and Sexual Activity   Alcohol use: No    Alcohol/week: 0.0 standard drinks   Drug use: No   Sexual activity: Yes  Other Topics Concern   Not on file  Social History Narrative   Regular exercise:  No   Caffeine Use:  Occasional sweet tea   She is legally separated   2 children ages 79 year old son and 73 year old daughter.  Both children.   Previously worked in [employer] research (medical), now disabled.   Completed 2 yrs of college.     Social Determinants of Health   Financial Resource Strain: Not on file  Food Insecurity: Not on file  Transportation Needs: Not on file  Physical Activity: Not on file  Stress: Not on file  Social Connections: Not on file  Intimate Partner Violence: Not on file    Review of Systems:    Constitutional: No weight loss, fever or chills Cardiovascular: No chest pain Respiratory: No SOB  Gastrointestinal: See HPI and otherwise negative   Physical Exam:  Vital signs: BP 138/74   Pulse 78   Ht '5\' 2"'$  (1.575 m)   Wt 187 lb (84.8 kg)   LMP 02/25/2009   BMI 34.20 kg/m    Constitutional:   Pleasant overweight AA female appears to be in NAD, Well developed, Well nourished, alert and cooperative Respiratory: Respirations even and unlabored. Lungs clear to auscultation bilaterally.   No wheezes, crackles, or rhonchi.  Cardiovascular: Normal S1, S2. No MRG. Regular rate and rhythm. No peripheral edema, cyanosis or pallor.  Gastrointestinal:  Soft, nondistended, nontender. No rebound or guarding. Normal bowel sounds. No appreciable masses or hepatomegaly. Rectal:  Not performed.  Psychiatric: Demonstrates good  judgement and reason without abnormal affect or behaviors.  No recent labs or imaging.  Assessment: 1.  IBS-D: Increased episodes recently now up to twice a week regardless of Dicyclomine 3 times daily and Levsin 4 times daily, last colonoscopy normal in 2019, admits to stress/anxiety  Plan: 1.  Discussed with patient that she should restart a fiber supplement.  We discussed Benefiber, Citrucel or Metamucil. 2.  Also recommend the patient restart a  probiotic such as Align once daily for at least the next months. 3.  Patient can increase her Dicyclomine to 20 mg 4 times daily, 20 to 30 minutes before meals and at bedtime.  Patient tells me she has enough of this medicine at home. 4.  Continue Levsin as needed 5.  Discussed that the patient may want to have a discussion with her PCP in regards to stress/anxiety as some of the medicines used to treat this are also helpful with IBS.  If she does not want to be on medicine then needs to find a way to reduce stress such as yoga or some other form of stress relief for her. 6.  Patient to follow in clinic with me in 2 months or sooner if necessary.  Cindy Newer, PA-C [facility] 02/05/2021, 9:03 AM  Cc: Aretta Nip, MD

## 2021-02-10 NOTE — Progress Notes (Signed)
Reviewed and agree with management plan.  Tyrek Lawhorn T. Nayib Remer, MD FACG 

## 2021-02-15 DIAGNOSIS — M461 Sacroiliitis, not elsewhere classified: Secondary | ICD-10-CM | POA: Diagnosis not present

## 2021-02-22 DIAGNOSIS — M461 Sacroiliitis, not elsewhere classified: Secondary | ICD-10-CM | POA: Diagnosis not present

## 2021-02-24 DIAGNOSIS — R5383 Other fatigue: Secondary | ICD-10-CM | POA: Diagnosis not present

## 2021-02-24 DIAGNOSIS — H5213 Myopia, bilateral: Secondary | ICD-10-CM | POA: Diagnosis not present

## 2021-02-24 DIAGNOSIS — K753 Granulomatous hepatitis, not elsewhere classified: Secondary | ICD-10-CM | POA: Diagnosis not present

## 2021-02-24 DIAGNOSIS — K589 Irritable bowel syndrome without diarrhea: Secondary | ICD-10-CM | POA: Diagnosis not present

## 2021-02-24 DIAGNOSIS — E559 Vitamin D deficiency, unspecified: Secondary | ICD-10-CM | POA: Diagnosis not present

## 2021-03-08 DIAGNOSIS — S62630A Displaced fracture of distal phalanx of right index finger, initial encounter for closed fracture: Secondary | ICD-10-CM | POA: Diagnosis not present

## 2021-03-08 DIAGNOSIS — S62632B Displaced fracture of distal phalanx of right middle finger, initial encounter for open fracture: Secondary | ICD-10-CM | POA: Diagnosis not present

## 2021-03-18 DIAGNOSIS — M461 Sacroiliitis, not elsewhere classified: Secondary | ICD-10-CM | POA: Diagnosis not present

## 2021-03-18 DIAGNOSIS — M5416 Radiculopathy, lumbar region: Secondary | ICD-10-CM | POA: Diagnosis not present

## 2021-04-01 DIAGNOSIS — M545 Low back pain, unspecified: Secondary | ICD-10-CM | POA: Diagnosis not present

## 2021-04-05 ENCOUNTER — Other Ambulatory Visit: Payer: Self-pay | Admitting: Gastroenterology

## 2021-04-06 DIAGNOSIS — M545 Low back pain, unspecified: Secondary | ICD-10-CM | POA: Diagnosis not present

## 2021-04-12 ENCOUNTER — Other Ambulatory Visit: Payer: Self-pay | Admitting: Physician Assistant

## 2021-04-29 DIAGNOSIS — M79641 Pain in right hand: Secondary | ICD-10-CM | POA: Diagnosis not present

## 2021-04-29 DIAGNOSIS — S67193D Crushing injury of left middle finger, subsequent encounter: Secondary | ICD-10-CM | POA: Diagnosis not present

## 2021-04-29 DIAGNOSIS — S67191D Crushing injury of left index finger, subsequent encounter: Secondary | ICD-10-CM | POA: Diagnosis not present

## 2021-05-03 DIAGNOSIS — M25641 Stiffness of right hand, not elsewhere classified: Secondary | ICD-10-CM | POA: Diagnosis not present

## 2021-05-03 DIAGNOSIS — T7840XD Allergy, unspecified, subsequent encounter: Secondary | ICD-10-CM | POA: Diagnosis not present

## 2021-05-03 DIAGNOSIS — M79641 Pain in right hand: Secondary | ICD-10-CM | POA: Diagnosis not present

## 2021-05-06 ENCOUNTER — Ambulatory Visit: Payer: Medicare HMO | Admitting: Gastroenterology

## 2021-05-06 ENCOUNTER — Encounter: Payer: Self-pay | Admitting: Gastroenterology

## 2021-05-06 VITALS — BP 138/84 | HR 74 | Ht 62.0 in | Wt 181.2 lb

## 2021-05-06 DIAGNOSIS — K580 Irritable bowel syndrome with diarrhea: Secondary | ICD-10-CM

## 2021-05-06 DIAGNOSIS — K219 Gastro-esophageal reflux disease without esophagitis: Secondary | ICD-10-CM

## 2021-05-06 NOTE — Patient Instructions (Signed)
Continue dicyclomine and hyoscyamine.   The [facility] providers would like to encourage you to use [facility] to communicate with providers for non-urgent requests or questions.  Due to long hold times on the telephone, sending your provider a message by [facility] may be a faster and more efficient way to get a response.  Please allow 48 business hours for a response.  Please remember that this is for non-urgent requests.   Thank you for choosing me and [facility].  Pricilla Riffle. Dagoberto Ligas., MD., Marval Regal

## 2021-05-06 NOTE — Progress Notes (Signed)
    History of Present Illness: This is a 55 year old female with IBS with an alternating pattern.  Over the past few days she has had increased diarrhea and increased cramping with fatigue.  She feels she may have a viral infection.  She denies any recent antibiotic usage or medication changes.   Current Medications, Allergies, Past Medical History, Past Surgical History, Family History and Social History were reviewed in [employer] record.   Physical Exam: General: Well developed, well nourished, no acute distress Head: Normocephalic and atraumatic Eyes: Sclerae anicteric, EOMI Ears: Normal auditory acuity Mouth: Not examined, mask on during Covid-19 pandemic Lungs: Clear throughout to auscultation Heart: Regular rate and rhythm; no murmurs, rubs or bruits Abdomen: Soft, mild generalized tenderness to deep palpation and non distended. No masses, hepatosplenomegaly or hernias noted. Normal Bowel sounds Rectal: Not done Musculoskeletal: Symmetrical with no gross deformities  Pulses:  Normal pulses noted Extremities: No clubbing, cyanosis, edema or deformities noted Neurological: Alert oriented x 4, grossly nonfocal Psychological:  Alert and cooperative. Normal mood and affect   Assessment and Recommendations:  IBS, alternating pattern.  Recent flare with increased diarrhea, cramping and fatigue could be a mild viral infection.  She is advised to remain on a clear liquid diet and advance as tolerated.  Continue dicyclomine 20 mg p.o. before meals and at bedtime, continue Levsin 1-2 SL or p.o. every 4 hours as needed.  Call if symptoms do not improve over the next 2 to 3 days. REV in 1 year.  GERD.  Continue pantoprazole 40 mg p.o. twice daily.  Follow antireflux measures. REV in 1 year.

## 2021-05-23 ENCOUNTER — Other Ambulatory Visit: Payer: Self-pay | Admitting: Gastroenterology

## 2021-05-25 DIAGNOSIS — M25551 Pain in right hip: Secondary | ICD-10-CM | POA: Diagnosis not present

## 2021-05-25 DIAGNOSIS — M7071 Other bursitis of hip, right hip: Secondary | ICD-10-CM | POA: Diagnosis not present

## 2021-05-31 DIAGNOSIS — E559 Vitamin D deficiency, unspecified: Secondary | ICD-10-CM | POA: Diagnosis not present

## 2021-05-31 DIAGNOSIS — R5383 Other fatigue: Secondary | ICD-10-CM | POA: Diagnosis not present

## 2021-05-31 DIAGNOSIS — K589 Irritable bowel syndrome without diarrhea: Secondary | ICD-10-CM | POA: Diagnosis not present

## 2021-06-22 DIAGNOSIS — M545 Low back pain, unspecified: Secondary | ICD-10-CM | POA: Diagnosis not present

## 2021-06-22 DIAGNOSIS — M7071 Other bursitis of hip, right hip: Secondary | ICD-10-CM | POA: Diagnosis not present

## 2021-06-22 DIAGNOSIS — M25551 Pain in right hip: Secondary | ICD-10-CM | POA: Diagnosis not present

## 2021-07-01 DIAGNOSIS — B349 Viral infection, unspecified: Secondary | ICD-10-CM | POA: Diagnosis not present

## 2021-07-18 ENCOUNTER — Other Ambulatory Visit: Payer: Self-pay | Admitting: Gastroenterology

## 2021-07-19 DIAGNOSIS — F321 Major depressive disorder, single episode, moderate: Secondary | ICD-10-CM | POA: Diagnosis not present

## 2021-07-19 DIAGNOSIS — E559 Vitamin D deficiency, unspecified: Secondary | ICD-10-CM | POA: Diagnosis not present

## 2021-08-10 DIAGNOSIS — M47812 Spondylosis without myelopathy or radiculopathy, cervical region: Secondary | ICD-10-CM | POA: Diagnosis not present

## 2021-08-10 DIAGNOSIS — M7071 Other bursitis of hip, right hip: Secondary | ICD-10-CM | POA: Diagnosis not present

## 2021-08-10 DIAGNOSIS — M25551 Pain in right hip: Secondary | ICD-10-CM | POA: Diagnosis not present

## 2021-08-10 DIAGNOSIS — M5412 Radiculopathy, cervical region: Secondary | ICD-10-CM | POA: Diagnosis not present

## 2021-08-13 DIAGNOSIS — M25559 Pain in unspecified hip: Secondary | ICD-10-CM | POA: Diagnosis not present

## 2021-08-13 DIAGNOSIS — M542 Cervicalgia: Secondary | ICD-10-CM | POA: Diagnosis not present

## 2021-08-20 DIAGNOSIS — M25551 Pain in right hip: Secondary | ICD-10-CM | POA: Diagnosis not present

## 2021-08-30 DIAGNOSIS — B370 Candidal stomatitis: Secondary | ICD-10-CM | POA: Diagnosis not present

## 2021-08-30 DIAGNOSIS — J029 Acute pharyngitis, unspecified: Secondary | ICD-10-CM | POA: Diagnosis not present

## 2021-09-10 ENCOUNTER — Other Ambulatory Visit: Payer: Self-pay | Admitting: Gastroenterology

## 2021-09-14 ENCOUNTER — Telehealth: Payer: Self-pay | Admitting: Gastroenterology

## 2021-09-14 MED ORDER — DICYCLOMINE HCL 20 MG PO TABS: 20.0000 mg | ORAL_TABLET | Freq: Three times a day (TID) | ORAL | 5 refills | Status: DC

## 2021-09-14 NOTE — Telephone Encounter (Signed)
Inbound call from patient states prescription for dicyclomine need to be updated to 4x a day for insurance to cover  ?

## 2021-09-14 NOTE — Telephone Encounter (Signed)
Patient states she was increased to dicyclomine 20 mg four times a day before meals and at bedtime by Dr. Fuller Plan and needs a updated refill sent to her pharmacy. Prescription sent to [facility].  ?

## 2021-09-16 DIAGNOSIS — F321 Major depressive disorder, single episode, moderate: Secondary | ICD-10-CM | POA: Diagnosis not present

## 2021-09-16 DIAGNOSIS — E559 Vitamin D deficiency, unspecified: Secondary | ICD-10-CM | POA: Diagnosis not present

## 2021-09-27 DIAGNOSIS — M25551 Pain in right hip: Secondary | ICD-10-CM | POA: Diagnosis not present

## 2021-09-27 DIAGNOSIS — M542 Cervicalgia: Secondary | ICD-10-CM | POA: Diagnosis not present

## 2021-10-13 DIAGNOSIS — M5030 Other cervical disc degeneration, unspecified cervical region: Secondary | ICD-10-CM | POA: Diagnosis not present

## 2021-10-13 DIAGNOSIS — M542 Cervicalgia: Secondary | ICD-10-CM | POA: Diagnosis not present

## 2021-10-26 DIAGNOSIS — M545 Low back pain, unspecified: Secondary | ICD-10-CM | POA: Diagnosis not present

## 2021-10-26 DIAGNOSIS — M25551 Pain in right hip: Secondary | ICD-10-CM | POA: Diagnosis not present

## 2021-10-26 DIAGNOSIS — M5030 Other cervical disc degeneration, unspecified cervical region: Secondary | ICD-10-CM | POA: Diagnosis not present

## 2021-10-29 ENCOUNTER — Other Ambulatory Visit: Payer: Self-pay | Admitting: Gastroenterology

## 2021-11-15 DIAGNOSIS — M5416 Radiculopathy, lumbar region: Secondary | ICD-10-CM | POA: Diagnosis not present

## 2021-11-15 DIAGNOSIS — M542 Cervicalgia: Secondary | ICD-10-CM | POA: Diagnosis not present

## 2021-11-15 DIAGNOSIS — M25551 Pain in right hip: Secondary | ICD-10-CM | POA: Diagnosis not present

## 2021-11-15 DIAGNOSIS — M5459 Other low back pain: Secondary | ICD-10-CM | POA: Diagnosis not present

## 2021-11-17 DIAGNOSIS — G8929 Other chronic pain: Secondary | ICD-10-CM | POA: Diagnosis not present

## 2021-11-17 DIAGNOSIS — F321 Major depressive disorder, single episode, moderate: Secondary | ICD-10-CM | POA: Diagnosis not present

## 2021-11-17 DIAGNOSIS — R413 Other amnesia: Secondary | ICD-10-CM | POA: Diagnosis not present

## 2021-11-17 DIAGNOSIS — M545 Low back pain, unspecified: Secondary | ICD-10-CM | POA: Diagnosis not present

## 2021-11-17 DIAGNOSIS — R5382 Chronic fatigue, unspecified: Secondary | ICD-10-CM | POA: Diagnosis not present

## 2021-11-17 DIAGNOSIS — M5136 Other intervertebral disc degeneration, lumbar region: Secondary | ICD-10-CM | POA: Diagnosis not present

## 2021-11-17 DIAGNOSIS — E559 Vitamin D deficiency, unspecified: Secondary | ICD-10-CM | POA: Diagnosis not present

## 2021-11-17 DIAGNOSIS — M5030 Other cervical disc degeneration, unspecified cervical region: Secondary | ICD-10-CM | POA: Diagnosis not present

## 2021-11-17 DIAGNOSIS — Z6831 Body mass index (BMI) 31.0-31.9, adult: Secondary | ICD-10-CM | POA: Diagnosis not present

## 2021-12-01 DIAGNOSIS — M25551 Pain in right hip: Secondary | ICD-10-CM | POA: Diagnosis not present

## 2021-12-01 DIAGNOSIS — M542 Cervicalgia: Secondary | ICD-10-CM | POA: Diagnosis not present

## 2021-12-01 DIAGNOSIS — M5459 Other low back pain: Secondary | ICD-10-CM | POA: Diagnosis not present

## 2021-12-03 DIAGNOSIS — M5459 Other low back pain: Secondary | ICD-10-CM | POA: Diagnosis not present

## 2021-12-03 DIAGNOSIS — M25551 Pain in right hip: Secondary | ICD-10-CM | POA: Diagnosis not present

## 2021-12-03 DIAGNOSIS — M542 Cervicalgia: Secondary | ICD-10-CM | POA: Diagnosis not present

## 2021-12-09 DIAGNOSIS — M5459 Other low back pain: Secondary | ICD-10-CM | POA: Diagnosis not present

## 2021-12-09 DIAGNOSIS — M542 Cervicalgia: Secondary | ICD-10-CM | POA: Diagnosis not present

## 2021-12-09 DIAGNOSIS — M25551 Pain in right hip: Secondary | ICD-10-CM | POA: Diagnosis not present

## 2021-12-14 ENCOUNTER — Other Ambulatory Visit: Payer: Self-pay | Admitting: Gastroenterology

## 2021-12-14 DIAGNOSIS — M542 Cervicalgia: Secondary | ICD-10-CM | POA: Diagnosis not present

## 2021-12-14 DIAGNOSIS — M25551 Pain in right hip: Secondary | ICD-10-CM | POA: Diagnosis not present

## 2021-12-14 DIAGNOSIS — M5459 Other low back pain: Secondary | ICD-10-CM | POA: Diagnosis not present

## 2021-12-16 DIAGNOSIS — M5459 Other low back pain: Secondary | ICD-10-CM | POA: Diagnosis not present

## 2021-12-16 DIAGNOSIS — M25551 Pain in right hip: Secondary | ICD-10-CM | POA: Diagnosis not present

## 2021-12-16 DIAGNOSIS — M542 Cervicalgia: Secondary | ICD-10-CM | POA: Diagnosis not present

## 2021-12-22 DIAGNOSIS — R0789 Other chest pain: Secondary | ICD-10-CM | POA: Diagnosis not present

## 2021-12-31 DIAGNOSIS — M5459 Other low back pain: Secondary | ICD-10-CM | POA: Diagnosis not present

## 2021-12-31 DIAGNOSIS — M542 Cervicalgia: Secondary | ICD-10-CM | POA: Diagnosis not present

## 2021-12-31 DIAGNOSIS — M25551 Pain in right hip: Secondary | ICD-10-CM | POA: Diagnosis not present

## 2021-12-31 DIAGNOSIS — Z1231 Encounter for screening mammogram for malignant neoplasm of breast: Secondary | ICD-10-CM | POA: Diagnosis not present

## 2022-01-06 DIAGNOSIS — K7689 Other specified diseases of liver: Secondary | ICD-10-CM | POA: Diagnosis not present

## 2022-01-06 DIAGNOSIS — Z981 Arthrodesis status: Secondary | ICD-10-CM | POA: Diagnosis not present

## 2022-01-06 DIAGNOSIS — Q632 Ectopic kidney: Secondary | ICD-10-CM | POA: Diagnosis not present

## 2022-01-06 DIAGNOSIS — K753 Granulomatous hepatitis, not elsewhere classified: Secondary | ICD-10-CM | POA: Diagnosis not present

## 2022-01-06 DIAGNOSIS — D7389 Other diseases of spleen: Secondary | ICD-10-CM | POA: Diagnosis not present

## 2022-01-06 DIAGNOSIS — K769 Liver disease, unspecified: Secondary | ICD-10-CM | POA: Diagnosis not present

## 2022-01-11 ENCOUNTER — Other Ambulatory Visit: Payer: Self-pay | Admitting: Gastroenterology

## 2022-01-11 DIAGNOSIS — M25551 Pain in right hip: Secondary | ICD-10-CM | POA: Diagnosis not present

## 2022-01-11 DIAGNOSIS — M5416 Radiculopathy, lumbar region: Secondary | ICD-10-CM | POA: Diagnosis not present

## 2022-01-12 DIAGNOSIS — M25551 Pain in right hip: Secondary | ICD-10-CM | POA: Diagnosis not present

## 2022-01-17 DIAGNOSIS — D649 Anemia, unspecified: Secondary | ICD-10-CM | POA: Diagnosis not present

## 2022-01-17 DIAGNOSIS — E559 Vitamin D deficiency, unspecified: Secondary | ICD-10-CM | POA: Diagnosis not present

## 2022-01-17 DIAGNOSIS — F321 Major depressive disorder, single episode, moderate: Secondary | ICD-10-CM | POA: Diagnosis not present

## 2022-01-17 DIAGNOSIS — R5382 Chronic fatigue, unspecified: Secondary | ICD-10-CM | POA: Diagnosis not present

## 2022-01-17 DIAGNOSIS — Z90710 Acquired absence of both cervix and uterus: Secondary | ICD-10-CM | POA: Diagnosis not present

## 2022-01-17 DIAGNOSIS — R413 Other amnesia: Secondary | ICD-10-CM | POA: Diagnosis not present

## 2022-01-19 DIAGNOSIS — K769 Liver disease, unspecified: Secondary | ICD-10-CM | POA: Diagnosis not present

## 2022-01-19 DIAGNOSIS — R5383 Other fatigue: Secondary | ICD-10-CM | POA: Diagnosis not present

## 2022-01-19 DIAGNOSIS — M810 Age-related osteoporosis without current pathological fracture: Secondary | ICD-10-CM | POA: Diagnosis not present

## 2022-01-19 DIAGNOSIS — M25551 Pain in right hip: Secondary | ICD-10-CM | POA: Diagnosis not present

## 2022-01-19 DIAGNOSIS — K753 Granulomatous hepatitis, not elsewhere classified: Secondary | ICD-10-CM | POA: Diagnosis not present

## 2022-01-19 DIAGNOSIS — M2550 Pain in unspecified joint: Secondary | ICD-10-CM | POA: Diagnosis not present

## 2022-01-20 DIAGNOSIS — M542 Cervicalgia: Secondary | ICD-10-CM | POA: Diagnosis not present

## 2022-01-20 DIAGNOSIS — M25551 Pain in right hip: Secondary | ICD-10-CM | POA: Diagnosis not present

## 2022-01-20 DIAGNOSIS — M5459 Other low back pain: Secondary | ICD-10-CM | POA: Diagnosis not present

## 2022-01-21 DIAGNOSIS — Z780 Asymptomatic menopausal state: Secondary | ICD-10-CM | POA: Diagnosis not present

## 2022-01-28 DIAGNOSIS — M5459 Other low back pain: Secondary | ICD-10-CM | POA: Diagnosis not present

## 2022-01-28 DIAGNOSIS — M542 Cervicalgia: Secondary | ICD-10-CM | POA: Diagnosis not present

## 2022-01-28 DIAGNOSIS — M25551 Pain in right hip: Secondary | ICD-10-CM | POA: Diagnosis not present

## 2022-02-01 DIAGNOSIS — M5459 Other low back pain: Secondary | ICD-10-CM | POA: Diagnosis not present

## 2022-02-01 DIAGNOSIS — M25551 Pain in right hip: Secondary | ICD-10-CM | POA: Diagnosis not present

## 2022-02-01 DIAGNOSIS — M542 Cervicalgia: Secondary | ICD-10-CM | POA: Diagnosis not present

## 2022-02-02 DIAGNOSIS — M8580 Other specified disorders of bone density and structure, unspecified site: Secondary | ICD-10-CM | POA: Diagnosis not present

## 2022-02-02 DIAGNOSIS — E559 Vitamin D deficiency, unspecified: Secondary | ICD-10-CM | POA: Diagnosis not present

## 2022-02-07 ENCOUNTER — Other Ambulatory Visit: Payer: Self-pay | Admitting: Gastroenterology

## 2022-02-07 DIAGNOSIS — J069 Acute upper respiratory infection, unspecified: Secondary | ICD-10-CM | POA: Diagnosis not present

## 2022-02-07 DIAGNOSIS — R439 Unspecified disturbances of smell and taste: Secondary | ICD-10-CM | POA: Diagnosis not present

## 2022-02-07 DIAGNOSIS — J029 Acute pharyngitis, unspecified: Secondary | ICD-10-CM | POA: Diagnosis not present

## 2022-02-10 ENCOUNTER — Other Ambulatory Visit: Payer: Self-pay | Admitting: Gastroenterology

## 2022-02-16 DIAGNOSIS — M5459 Other low back pain: Secondary | ICD-10-CM | POA: Diagnosis not present

## 2022-02-16 DIAGNOSIS — M542 Cervicalgia: Secondary | ICD-10-CM | POA: Diagnosis not present

## 2022-02-16 DIAGNOSIS — M25551 Pain in right hip: Secondary | ICD-10-CM | POA: Diagnosis not present

## 2022-02-21 DIAGNOSIS — U071 COVID-19: Secondary | ICD-10-CM | POA: Diagnosis not present

## 2022-02-21 DIAGNOSIS — M5136 Other intervertebral disc degeneration, lumbar region: Secondary | ICD-10-CM | POA: Diagnosis not present

## 2022-02-21 DIAGNOSIS — K753 Granulomatous hepatitis, not elsewhere classified: Secondary | ICD-10-CM | POA: Diagnosis not present

## 2022-02-22 DIAGNOSIS — M542 Cervicalgia: Secondary | ICD-10-CM | POA: Diagnosis not present

## 2022-02-22 DIAGNOSIS — M5459 Other low back pain: Secondary | ICD-10-CM | POA: Diagnosis not present

## 2022-02-22 DIAGNOSIS — M25551 Pain in right hip: Secondary | ICD-10-CM | POA: Diagnosis not present

## 2022-03-08 DIAGNOSIS — M25551 Pain in right hip: Secondary | ICD-10-CM | POA: Diagnosis not present

## 2022-03-08 DIAGNOSIS — G894 Chronic pain syndrome: Secondary | ICD-10-CM | POA: Diagnosis not present

## 2022-03-15 DIAGNOSIS — M25551 Pain in right hip: Secondary | ICD-10-CM | POA: Diagnosis not present

## 2022-03-15 DIAGNOSIS — M542 Cervicalgia: Secondary | ICD-10-CM | POA: Diagnosis not present

## 2022-03-15 DIAGNOSIS — M5459 Other low back pain: Secondary | ICD-10-CM | POA: Diagnosis not present

## 2022-03-20 ENCOUNTER — Other Ambulatory Visit: Payer: Self-pay | Admitting: Gastroenterology

## 2022-03-21 DIAGNOSIS — Z6832 Body mass index (BMI) 32.0-32.9, adult: Secondary | ICD-10-CM | POA: Diagnosis not present

## 2022-03-21 DIAGNOSIS — M47816 Spondylosis without myelopathy or radiculopathy, lumbar region: Secondary | ICD-10-CM | POA: Diagnosis not present

## 2022-03-21 DIAGNOSIS — G8929 Other chronic pain: Secondary | ICD-10-CM | POA: Diagnosis not present

## 2022-03-24 DIAGNOSIS — E559 Vitamin D deficiency, unspecified: Secondary | ICD-10-CM | POA: Diagnosis not present

## 2022-03-24 DIAGNOSIS — M5136 Other intervertebral disc degeneration, lumbar region: Secondary | ICD-10-CM | POA: Diagnosis not present

## 2022-03-24 DIAGNOSIS — K753 Granulomatous hepatitis, not elsewhere classified: Secondary | ICD-10-CM | POA: Diagnosis not present

## 2022-03-24 DIAGNOSIS — F321 Major depressive disorder, single episode, moderate: Secondary | ICD-10-CM | POA: Diagnosis not present

## 2022-03-24 DIAGNOSIS — K589 Irritable bowel syndrome without diarrhea: Secondary | ICD-10-CM | POA: Diagnosis not present

## 2022-03-24 DIAGNOSIS — R413 Other amnesia: Secondary | ICD-10-CM | POA: Diagnosis not present

## 2022-03-24 DIAGNOSIS — Z0000 Encounter for general adult medical examination without abnormal findings: Secondary | ICD-10-CM | POA: Diagnosis not present

## 2022-03-24 DIAGNOSIS — R5382 Chronic fatigue, unspecified: Secondary | ICD-10-CM | POA: Diagnosis not present

## 2022-03-24 DIAGNOSIS — E785 Hyperlipidemia, unspecified: Secondary | ICD-10-CM | POA: Diagnosis not present

## 2022-03-24 DIAGNOSIS — Z1159 Encounter for screening for other viral diseases: Secondary | ICD-10-CM | POA: Diagnosis not present

## 2022-03-24 DIAGNOSIS — D649 Anemia, unspecified: Secondary | ICD-10-CM | POA: Diagnosis not present

## 2022-04-05 DIAGNOSIS — M542 Cervicalgia: Secondary | ICD-10-CM | POA: Diagnosis not present

## 2022-04-05 DIAGNOSIS — M25551 Pain in right hip: Secondary | ICD-10-CM | POA: Diagnosis not present

## 2022-04-05 DIAGNOSIS — M5459 Other low back pain: Secondary | ICD-10-CM | POA: Diagnosis not present

## 2022-04-12 ENCOUNTER — Other Ambulatory Visit: Payer: Self-pay | Admitting: Gastroenterology

## 2022-04-12 DIAGNOSIS — M25551 Pain in right hip: Secondary | ICD-10-CM | POA: Diagnosis not present

## 2022-04-12 DIAGNOSIS — M542 Cervicalgia: Secondary | ICD-10-CM | POA: Diagnosis not present

## 2022-04-12 DIAGNOSIS — M5459 Other low back pain: Secondary | ICD-10-CM | POA: Diagnosis not present

## 2022-04-13 ENCOUNTER — Other Ambulatory Visit: Payer: Self-pay | Admitting: Gastroenterology

## 2022-04-19 DIAGNOSIS — M25551 Pain in right hip: Secondary | ICD-10-CM | POA: Diagnosis not present

## 2022-04-19 DIAGNOSIS — M542 Cervicalgia: Secondary | ICD-10-CM | POA: Diagnosis not present

## 2022-04-19 DIAGNOSIS — M5459 Other low back pain: Secondary | ICD-10-CM | POA: Diagnosis not present

## 2022-04-26 DIAGNOSIS — M542 Cervicalgia: Secondary | ICD-10-CM | POA: Diagnosis not present

## 2022-04-26 DIAGNOSIS — M25551 Pain in right hip: Secondary | ICD-10-CM | POA: Diagnosis not present

## 2022-04-26 DIAGNOSIS — M5459 Other low back pain: Secondary | ICD-10-CM | POA: Diagnosis not present

## 2022-04-29 DIAGNOSIS — M5416 Radiculopathy, lumbar region: Secondary | ICD-10-CM | POA: Diagnosis not present

## 2022-05-09 ENCOUNTER — Other Ambulatory Visit: Payer: Self-pay | Admitting: Gastroenterology

## 2022-05-10 DIAGNOSIS — M5459 Other low back pain: Secondary | ICD-10-CM | POA: Diagnosis not present

## 2022-05-10 DIAGNOSIS — M542 Cervicalgia: Secondary | ICD-10-CM | POA: Diagnosis not present

## 2022-05-10 DIAGNOSIS — M25551 Pain in right hip: Secondary | ICD-10-CM | POA: Diagnosis not present

## 2022-05-16 DIAGNOSIS — U071 COVID-19: Secondary | ICD-10-CM | POA: Diagnosis not present

## 2022-06-01 DIAGNOSIS — M5459 Other low back pain: Secondary | ICD-10-CM | POA: Diagnosis not present

## 2022-06-01 DIAGNOSIS — M25551 Pain in right hip: Secondary | ICD-10-CM | POA: Diagnosis not present

## 2022-06-01 DIAGNOSIS — M542 Cervicalgia: Secondary | ICD-10-CM | POA: Diagnosis not present

## 2022-06-02 DIAGNOSIS — M5416 Radiculopathy, lumbar region: Secondary | ICD-10-CM | POA: Diagnosis not present

## 2022-06-02 DIAGNOSIS — M79644 Pain in right finger(s): Secondary | ICD-10-CM | POA: Diagnosis not present

## 2022-06-02 DIAGNOSIS — M25552 Pain in left hip: Secondary | ICD-10-CM | POA: Diagnosis not present

## 2022-06-03 ENCOUNTER — Other Ambulatory Visit: Payer: Self-pay | Admitting: Gastroenterology

## 2022-06-10 DIAGNOSIS — M25551 Pain in right hip: Secondary | ICD-10-CM | POA: Diagnosis not present

## 2022-06-10 DIAGNOSIS — M5459 Other low back pain: Secondary | ICD-10-CM | POA: Diagnosis not present

## 2022-06-10 DIAGNOSIS — M542 Cervicalgia: Secondary | ICD-10-CM | POA: Diagnosis not present

## 2022-06-13 ENCOUNTER — Other Ambulatory Visit: Payer: Self-pay | Admitting: Gastroenterology

## 2022-06-14 ENCOUNTER — Other Ambulatory Visit: Payer: Self-pay | Admitting: Gastroenterology

## 2022-06-15 DIAGNOSIS — M542 Cervicalgia: Secondary | ICD-10-CM | POA: Diagnosis not present

## 2022-06-15 DIAGNOSIS — M5459 Other low back pain: Secondary | ICD-10-CM | POA: Diagnosis not present

## 2022-06-15 DIAGNOSIS — M25551 Pain in right hip: Secondary | ICD-10-CM | POA: Diagnosis not present

## 2022-06-17 ENCOUNTER — Other Ambulatory Visit: Payer: Self-pay | Admitting: Gastroenterology

## 2022-06-17 ENCOUNTER — Telehealth: Payer: Self-pay | Admitting: Gastroenterology

## 2022-06-17 MED ORDER — HYOSCYAMINE SULFATE 0.125 MG SL SUBL: SUBLINGUAL_TABLET | SUBLINGUAL | 0 refills | Status: DC

## 2022-06-17 NOTE — Telephone Encounter (Signed)
Prescription sent to patient's pharmacy until scheduled appt on 07/06/22.

## 2022-06-17 NOTE — Telephone Encounter (Signed)
Inbound call from patient requesting a refill for Levsin. Please advise.

## 2022-06-21 DIAGNOSIS — R200 Anesthesia of skin: Secondary | ICD-10-CM | POA: Diagnosis not present

## 2022-06-21 DIAGNOSIS — M79641 Pain in right hand: Secondary | ICD-10-CM | POA: Diagnosis not present

## 2022-06-21 DIAGNOSIS — R202 Paresthesia of skin: Secondary | ICD-10-CM | POA: Diagnosis not present

## 2022-06-23 DIAGNOSIS — M5459 Other low back pain: Secondary | ICD-10-CM | POA: Diagnosis not present

## 2022-06-23 DIAGNOSIS — M25551 Pain in right hip: Secondary | ICD-10-CM | POA: Diagnosis not present

## 2022-06-23 DIAGNOSIS — M542 Cervicalgia: Secondary | ICD-10-CM | POA: Diagnosis not present

## 2022-06-24 DIAGNOSIS — J029 Acute pharyngitis, unspecified: Secondary | ICD-10-CM | POA: Diagnosis not present

## 2022-06-24 DIAGNOSIS — L739 Follicular disorder, unspecified: Secondary | ICD-10-CM | POA: Diagnosis not present

## 2022-06-24 DIAGNOSIS — J301 Allergic rhinitis due to pollen: Secondary | ICD-10-CM | POA: Diagnosis not present

## 2022-07-01 DIAGNOSIS — M25552 Pain in left hip: Secondary | ICD-10-CM | POA: Diagnosis not present

## 2022-07-06 ENCOUNTER — Ambulatory Visit: Payer: Medicare HMO | Admitting: Gastroenterology

## 2022-07-06 ENCOUNTER — Encounter: Payer: Self-pay | Admitting: Gastroenterology

## 2022-07-06 VITALS — BP 140/80 | HR 99 | Ht 62.0 in | Wt 185.0 lb

## 2022-07-06 DIAGNOSIS — K219 Gastro-esophageal reflux disease without esophagitis: Secondary | ICD-10-CM | POA: Diagnosis not present

## 2022-07-06 DIAGNOSIS — K582 Mixed irritable bowel syndrome: Secondary | ICD-10-CM

## 2022-07-06 MED ORDER — PANTOPRAZOLE SODIUM 40 MG PO TBEC: DELAYED_RELEASE_TABLET | ORAL | 11 refills | Status: DC

## 2022-07-06 MED ORDER — HYOSCYAMINE SULFATE 0.125 MG SL SUBL: SUBLINGUAL_TABLET | SUBLINGUAL | 11 refills | Status: DC

## 2022-07-06 MED ORDER — DICYCLOMINE HCL 20 MG PO TABS: ORAL_TABLET | ORAL | 11 refills | Status: DC

## 2022-07-06 NOTE — Progress Notes (Signed)
    Assessment     IBS-D GERD Granulomatous liver disease, etiology unclear   Recommendations    Low FODMAP diet  Continue hyoscyamine, dicyclomine Continue pantoprazole 40 mg bid, antireflux measures Continue follow-up with [facility] as planned   HPI    This is a 57 year old female GERD and IBS.  Her IBS symptoms are primarily urgent diarrhea with abdominal pain.  Symptoms are generally well-controlled on dicyclomine and hyoscyamine.  She has several foods that she avoids including lactose products, garlic and onions.  Reflux symptoms are generally well-controlled on pantoprazole.  For breakthrough symptoms she has taken Rolaids, Pepcid AC and Mylanta on occasional with positive effect.  She continues her follow-up at [facility].   Labs / Imaging       Latest Ref Rng & Units 03/07/2019   11:30 AM 01/18/2019    9:30 AM 04/24/2018    2:23 PM  Hepatic Function  Total Protein 6.0 - 8.3 g/dL 7.7  7.8  7.5   Albumin 3.5 - 5.2 g/dL 3.9  3.5  3.8   AST 0 - 37 U/L '12  20  20   '$ ALT 0 - 35 U/L '9  13  13   '$ Alk Phosphatase 39 - 117 U/L 94  95  84   Total Bilirubin 0.2 - 1.2 mg/dL 0.4  0.3  0.3        Latest Ref Rng & Units 03/25/2020   10:09 AM 06/18/2019    7:00 AM 03/07/2019   11:30 AM  CBC  WBC 4.0 - 10.5 K/uL 6.2  9.2  10.6   Hemoglobin 12.0 - 15.0 g/dL 11.2  11.1  12.0   Hematocrit 36.0 - 46.0 % 33.7  34.2  36.0   Platelets 150.0 - 400.0 K/uL 411.0  559  407.0    Current Medications, Allergies, Past Medical History, Past Surgical History, Family History and Social History were reviewed in [employer] record.   Physical Exam: General: Well developed, well nourished, no acute distress Head: Normocephalic and atraumatic Eyes: Sclerae anicteric, EOMI Ears: Normal auditory acuity Mouth: No deformities or lesions noted Lungs: Clear throughout to auscultation Heart: Regular rate and rhythm; No murmurs, rubs or bruits Abdomen: Soft, non  tender and non distended. No masses, hepatosplenomegaly or hernias noted. Normal Bowel sounds Rectal: Not done Musculoskeletal: Symmetrical with no gross deformities  Pulses:  Normal pulses noted Extremities: No edema or deformities noted Neurological: Alert oriented x 4, grossly nonfocal Psychological:  Alert and cooperative. Normal mood and affect   Ahaan Zobrist T. Fuller Plan, MD 07/06/2022, 2:00 PM

## 2022-07-06 NOTE — Patient Instructions (Addendum)
We have sent the following medications to your pharmacy for you to pick up at your convenience: dicyclomine, hyoscyamine and pantoprazole.   You have been given a low fodmap diet to follow.   The [facility] providers would like to encourage you to use [facility] to communicate with providers for non-urgent requests or questions.  Due to long hold times on the telephone, sending your provider a message by [facility] may be a faster and more efficient way to get a response.  Please allow 48 business hours for a response.  Please remember that this is for non-urgent requests.   Thank you for choosing me and [facility].  Pricilla Riffle. Dagoberto Ligas., MD., Marval Regal

## 2022-07-07 DIAGNOSIS — M5459 Other low back pain: Secondary | ICD-10-CM | POA: Diagnosis not present

## 2022-07-07 DIAGNOSIS — M542 Cervicalgia: Secondary | ICD-10-CM | POA: Diagnosis not present

## 2022-07-07 DIAGNOSIS — M25551 Pain in right hip: Secondary | ICD-10-CM | POA: Diagnosis not present

## 2022-07-12 ENCOUNTER — Other Ambulatory Visit: Payer: Self-pay | Admitting: Gastroenterology

## 2022-07-12 DIAGNOSIS — E538 Deficiency of other specified B group vitamins: Secondary | ICD-10-CM | POA: Diagnosis not present

## 2022-07-12 DIAGNOSIS — Z03818 Encounter for observation for suspected exposure to other biological agents ruled out: Secondary | ICD-10-CM | POA: Diagnosis not present

## 2022-07-12 DIAGNOSIS — E559 Vitamin D deficiency, unspecified: Secondary | ICD-10-CM | POA: Diagnosis not present

## 2022-07-12 DIAGNOSIS — B349 Viral infection, unspecified: Secondary | ICD-10-CM | POA: Diagnosis not present

## 2022-07-12 DIAGNOSIS — R5382 Chronic fatigue, unspecified: Secondary | ICD-10-CM | POA: Diagnosis not present

## 2022-07-12 DIAGNOSIS — F321 Major depressive disorder, single episode, moderate: Secondary | ICD-10-CM | POA: Diagnosis not present

## 2022-07-13 DIAGNOSIS — G5601 Carpal tunnel syndrome, right upper limb: Secondary | ICD-10-CM | POA: Diagnosis not present

## 2022-07-19 DIAGNOSIS — M542 Cervicalgia: Secondary | ICD-10-CM | POA: Diagnosis not present

## 2022-07-19 DIAGNOSIS — M25551 Pain in right hip: Secondary | ICD-10-CM | POA: Diagnosis not present

## 2022-07-19 DIAGNOSIS — M5459 Other low back pain: Secondary | ICD-10-CM | POA: Diagnosis not present

## 2022-07-21 DIAGNOSIS — G5601 Carpal tunnel syndrome, right upper limb: Secondary | ICD-10-CM | POA: Diagnosis not present

## 2022-07-21 DIAGNOSIS — M79641 Pain in right hand: Secondary | ICD-10-CM | POA: Diagnosis not present

## 2022-07-21 DIAGNOSIS — R202 Paresthesia of skin: Secondary | ICD-10-CM | POA: Diagnosis not present

## 2022-07-21 DIAGNOSIS — R200 Anesthesia of skin: Secondary | ICD-10-CM | POA: Diagnosis not present

## 2022-07-26 DIAGNOSIS — M25551 Pain in right hip: Secondary | ICD-10-CM | POA: Diagnosis not present

## 2022-07-26 DIAGNOSIS — M542 Cervicalgia: Secondary | ICD-10-CM | POA: Diagnosis not present

## 2022-07-26 DIAGNOSIS — M5459 Other low back pain: Secondary | ICD-10-CM | POA: Diagnosis not present

## 2022-08-24 ENCOUNTER — Other Ambulatory Visit: Payer: Self-pay

## 2022-08-24 DIAGNOSIS — M7989 Other specified soft tissue disorders: Secondary | ICD-10-CM | POA: Diagnosis not present

## 2022-08-24 DIAGNOSIS — R2231 Localized swelling, mass and lump, right upper limb: Secondary | ICD-10-CM | POA: Diagnosis not present

## 2022-08-24 DIAGNOSIS — G5601 Carpal tunnel syndrome, right upper limb: Secondary | ICD-10-CM | POA: Diagnosis not present

## 2022-08-24 DIAGNOSIS — D2111 Benign neoplasm of connective and other soft tissue of right upper limb, including shoulder: Secondary | ICD-10-CM | POA: Diagnosis not present

## 2022-10-12 DIAGNOSIS — R5382 Chronic fatigue, unspecified: Secondary | ICD-10-CM | POA: Diagnosis not present

## 2022-10-12 DIAGNOSIS — N951 Menopausal and female climacteric states: Secondary | ICD-10-CM | POA: Diagnosis not present

## 2022-10-12 DIAGNOSIS — J301 Allergic rhinitis due to pollen: Secondary | ICD-10-CM | POA: Diagnosis not present

## 2022-10-12 DIAGNOSIS — E669 Obesity, unspecified: Secondary | ICD-10-CM | POA: Diagnosis not present

## 2022-10-12 DIAGNOSIS — E538 Deficiency of other specified B group vitamins: Secondary | ICD-10-CM | POA: Diagnosis not present

## 2022-10-12 DIAGNOSIS — F321 Major depressive disorder, single episode, moderate: Secondary | ICD-10-CM | POA: Diagnosis not present

## 2022-10-12 DIAGNOSIS — E559 Vitamin D deficiency, unspecified: Secondary | ICD-10-CM | POA: Diagnosis not present

## 2022-10-20 DIAGNOSIS — E538 Deficiency of other specified B group vitamins: Secondary | ICD-10-CM | POA: Diagnosis not present

## 2022-10-27 DIAGNOSIS — E538 Deficiency of other specified B group vitamins: Secondary | ICD-10-CM | POA: Diagnosis not present

## 2022-10-27 DIAGNOSIS — G5601 Carpal tunnel syndrome, right upper limb: Secondary | ICD-10-CM | POA: Diagnosis not present

## 2022-11-07 DIAGNOSIS — E538 Deficiency of other specified B group vitamins: Secondary | ICD-10-CM | POA: Diagnosis not present

## 2022-11-10 DIAGNOSIS — R5381 Other malaise: Secondary | ICD-10-CM | POA: Diagnosis not present

## 2022-11-10 DIAGNOSIS — J069 Acute upper respiratory infection, unspecified: Secondary | ICD-10-CM | POA: Diagnosis not present

## 2022-11-10 DIAGNOSIS — E559 Vitamin D deficiency, unspecified: Secondary | ICD-10-CM | POA: Diagnosis not present

## 2022-11-10 DIAGNOSIS — R5382 Chronic fatigue, unspecified: Secondary | ICD-10-CM | POA: Diagnosis not present

## 2022-11-10 DIAGNOSIS — E538 Deficiency of other specified B group vitamins: Secondary | ICD-10-CM | POA: Diagnosis not present

## 2022-11-16 DIAGNOSIS — E538 Deficiency of other specified B group vitamins: Secondary | ICD-10-CM | POA: Diagnosis not present

## 2022-11-24 DIAGNOSIS — R42 Dizziness and giddiness: Secondary | ICD-10-CM | POA: Diagnosis not present

## 2022-11-24 DIAGNOSIS — R55 Syncope and collapse: Secondary | ICD-10-CM | POA: Diagnosis not present

## 2022-11-25 ENCOUNTER — Other Ambulatory Visit: Payer: Self-pay | Admitting: Physician Assistant

## 2022-11-25 DIAGNOSIS — R42 Dizziness and giddiness: Secondary | ICD-10-CM

## 2022-12-08 ENCOUNTER — Ambulatory Visit: Payer: Medicare HMO | Admitting: Cardiology

## 2022-12-08 ENCOUNTER — Encounter: Payer: Self-pay | Admitting: Cardiology

## 2022-12-08 VITALS — BP 149/68 | HR 79 | Ht 63.0 in | Wt 189.0 lb

## 2022-12-08 DIAGNOSIS — R42 Dizziness and giddiness: Secondary | ICD-10-CM | POA: Diagnosis not present

## 2022-12-08 DIAGNOSIS — R9431 Abnormal electrocardiogram [ECG] [EKG]: Secondary | ICD-10-CM | POA: Diagnosis not present

## 2022-12-08 DIAGNOSIS — R072 Precordial pain: Secondary | ICD-10-CM | POA: Diagnosis not present

## 2022-12-08 DIAGNOSIS — R0989 Other specified symptoms and signs involving the circulatory and respiratory systems: Secondary | ICD-10-CM | POA: Diagnosis not present

## 2022-12-08 MED ORDER — NITROGLYCERIN 0.4 MG SL SUBL
0.4000 mg | SUBLINGUAL_TABLET | SUBLINGUAL | 0 refills | Status: AC | PRN
Start: 2022-12-08 — End: 2024-03-05

## 2022-12-08 MED ORDER — METOPROLOL TARTRATE 25 MG PO TABS: 25.0000 mg | ORAL_TABLET | Freq: Two times a day (BID) | ORAL | 0 refills | Status: DC

## 2022-12-08 NOTE — Progress Notes (Signed)
ID:  [name], DOB 06/02/1966, MRN [id number]  PCP:  Clayborn Heron, MD  Cardiologist:  Tessa Lerner, DO, [facility] (established care 12/08/22)  REASON FOR CONSULT: Abnormal EKG and chest pain  REQUESTING PHYSICIAN:  Laurann Montana, MD [phone] [address] [place],  [place] [address]  Chief Complaint  Patient presents with   Pre syncope   New Patient (Initial Visit)   Chest Pain    HPI  [name] is a 57 y.o. African-American female who presents to the clinic for evaluation of abnormal EKG and chest pain at the request of Laurann Montana, MD. Her past medical history and cardiovascular risk factors include: Depression, irritable bowel syndrome, chronic fatigue, GERD, necrotizing granuloma of the liver  Over the last 2 months patient been experiencing lightheaded and dizziness when she changes positions quickly or turning her head side-to-side.  No syncopal events.  She followed up with PCP.  At that visit and EKG noted sinus rhythm with T wave inversions.  Upon further asking questions she also endorsed chest pain.  Given EKG findings, chest pain, and lightheaded/dizziness patient is referred to cardiology for further evaluation and management.  Chest pain: Onset couple months ago. Intermittent -occurs 2 or 3 times per week Intensity 6 out of 10 Ache-like sensation At times worse with exertion. Better with resting and deep breathing  Elevated blood pressures on multiple encounters based on EMR but no formal diagnosis of benign essential hypertension.  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  No family history of premature coronary artery disease, sudden cardiac death, or cardiomyopathy.  CARDIAC DATABASE: EKG: December 08, 2022: Sinus rhythm, 78 bpm, normal axis, TWI consider anterior/lateral ischemia.  Echocardiogram: No results found for this or any previous visit from the past 1095 days.   Stress Testing: No results found for this or  any previous visit from the past 1095 days.    ALLERGIES: No Known Allergies  MEDICATION LIST PRIOR TO VISIT: Current Meds  Medication Sig   Acetaminophen 500 MG capsule  TAKE 2 TABLETS BY MOUTH EVERY 6 HOURS FOR 7 DAYS   dicyclomine (BENTYL) 20 MG tablet TAKE 1 TABLET(20 MG) BY MOUTH FOUR TIMES DAILY BEFORE MEALS AND AT BEDTIME   escitalopram (LEXAPRO) 10 MG tablet Take 10 mg by mouth daily.   fluticasone (FLONASE) 50 MCG/ACT nasal spray Place 2 sprays into both nostrils daily as needed.   hyoscyamine (LEVSIN SL) 0.125 MG SL tablet DISSOLVE 1 TO 2 TABLETS UNDER THE TONGUE EVERY 4 HOURS AS NEEDED   meclizine (ANTIVERT) 25 MG tablet Take 25 mg by mouth at bedtime.   methocarbamol (ROBAXIN) 750 MG tablet Take 750 mg by mouth 2 (two) times daily as needed for muscle spasms.   metoprolol tartrate (LOPRESSOR) 25 MG tablet Take 1 tablet (25 mg total) by mouth 2 (two) times daily.   nitroGLYCERIN (NITROSTAT) 0.4 MG SL tablet Place 1 tablet (0.4 mg total) under the tongue every 5 (five) minutes as needed for chest pain. If you require more than two tablets five minutes apart go to the nearest ER via EMS.   pantoprazole (PROTONIX) 40 MG tablet TAKE 1 TABLET BY MOUTH TWICE DAILY   traMADol (ULTRAM) 50 MG tablet Take 50 mg by mouth 3 (three) times daily as needed for moderate pain.    Vitamin D, Ergocalciferol, (DRISDOL) 1.25 MG (50000 UNIT) CAPS capsule Take 50,000 Units by mouth once a week.     PAST  MEDICAL HISTORY: 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   Irritable bowel syndrome    Overweight(278.02)    Personal history of colonic polyps 09/19/2011   tubular adenoma   Sarcoidosis 03/20/2019   Segmental colitis (HCC)    Tubular adenoma of colon     PAST SURGICAL HISTORY: Past Surgical History:  Procedure Laterality Date   COLONOSCOPY     FACET JOINT INJECTION  03/28/11   injections in  neck   hysterectomy  2000   IR SACROPLASTY BILATERAL  06/18/2019   KNEE SURGERY Right     Right knee arthroscopic partial lateral meniscectomy (01/22/07),arthroscopic partial lateral meniscectomy 5/09   POLYPECTOMY     SPINE SURGERY  10/13/06   Posterior interbody fusion (PLIF) L5-S1 (07/04/07)   WRIST FRACTURE SURGERY Left 2011   Due to injury at work in 2008.    FAMILY HISTORY: The patient family history includes Asthma in her mother; COPD in her mother; Cancer in her mother; Esophageal cancer in her father; Glaucoma in her mother; Hyperlipidemia in her mother; Hypertension in her maternal aunt; Stomach cancer in her mother.  SOCIAL HISTORY:  The patient  reports that she has never smoked. She has never used smokeless tobacco. She reports that she does not drink alcohol and does not use drugs.  REVIEW OF SYSTEMS: Review of Systems  Cardiovascular:  Positive for chest pain. Negative for claudication, dyspnea on exertion, irregular heartbeat, leg swelling, near-syncope, orthopnea, palpitations, paroxysmal nocturnal dyspnea and syncope.  Respiratory:  Negative for shortness of breath.   Hematologic/Lymphatic: Negative for bleeding problem.  Musculoskeletal:  Negative for muscle cramps and myalgias.  Neurological:  Positive for dizziness and light-headedness.    PHYSICAL EXAM:    12/08/2022   10:59 AM 07/06/2022    1:48 PM 05/06/2021   10:45 AM  Vitals with BMI  Height 5\' 3"  5\' 2"  5\' 2"   Weight 189 lbs 185 lbs 181 lbs 4 oz  BMI 33.49 33.83 33.14  Systolic 149 140 161  Diastolic 68 80 84  Pulse 79 99 74    Physical Exam  Constitutional: No distress.  HENT:  Nose: Nose normal. No nasal discharge.  Eyes: Conjunctivae are normal.  Neck: No JVD present.  Cardiovascular: Normal rate, regular rhythm, S1 normal and S2 normal. Exam reveals no gallop, no S3 and no S4.  No murmur heard. Pulses:      Carotid pulses are 2+ on the right side with bruit and 2+ on the left side.       Radial pulses are 2+ on the right side and 2+ on the left side.       Femoral pulses are 2+ on the right side and 2+ on the left side.      Popliteal pulses are 2+ on the right side and 2+ on the left side.       Dorsalis pedis pulses are 2+ on the right side and 2+ on the left side.       Posterior tibial pulses are 2+ on the right side and 2+ on the left side.  Pulmonary/Chest: Effort normal and breath sounds normal. No stridor. She has no wheezes. She has no rales. She exhibits no tenderness.  Abdominal: Soft. Bowel sounds are normal. She exhibits no distension. There is no abdominal tenderness.  Musculoskeletal:        General: No tenderness or edema.  Normal range of motion.     Cervical back: Neck supple.  Neurological: She is alert and oriented to person, place, and time.  Skin: Skin is warm and dry. No rash noted. No cyanosis. No jaundice. Nails show no clubbing.    LABORATORY DATA: External Labs: Collected: Nov 24 2022 provided by PCP. TSH 0.65. BUN 16, creatinine 0.84. AST 15, ALT 7, alkaline phosphatase 93. Hemoglobin 11.3, hematocrit 34.1%   Collected: July 12, 2022 provided by PCP. BUN 19, creatinine 0.92. Sodium 140, potassium 4, chloride 103, bicarb 30 AST 23, ALT 27, alkaline phosphatase 90. Hemoglobin 12.7, hematocrit 38.7%  IMPRESSION:    ICD-10-CM   1. Precordial pain  R07.2 EKG 12-Lead    PCV ECHOCARDIOGRAM COMPLETE    CT CORONARY MORPH W/CTA COR W/SCORE W/CA W/CM &/OR WO/CM    metoprolol tartrate (LOPRESSOR) 25 MG tablet    Basic metabolic panel    nitroGLYCERIN (NITROSTAT) 0.4 MG SL tablet    2. Abnormal EKG  R94.31 CT CORONARY MORPH W/CTA COR W/SCORE W/CA W/CM &/OR WO/CM    3. Lightheadedness  R42     4. Bruit of right carotid artery  R09.89 PCV CAROTID DUPLEX (BILATERAL)       RECOMMENDATIONS: FANNYE LENGEL is a 57 y.o. African-American female whose past medical history and cardiac risk factors include: Depression, irritable bowel syndrome,  chronic fatigue, GERD, necrotizing granuloma of the liver.  Precordial pain Abnormal EKG Symptoms concerning for cardiac discomfort. EKG: Sinus rhythm with TWI concerning for possible ischemia in the anterior/lateral leads. No active chest pain Recommended not to overexert until the workup is complete Echo will be ordered to evaluate for structural heart disease and left ventricular systolic function. Coronary CTA to evaluate for CAC and obstructive disease Start Lopressor 25 mg p.o. twice daily. Sublingual nitroglycerin tablets to use as needed basis.  Medication instructions and drug profile reviewed Check BMP a week prior to coronary CTA. Educated on seeking medical attention sooner by going to the closest ER via EMS if the symptoms increase in intensity, frequency, duration, or has typical chest pain as discussed in the office.  Patient verbalized understanding. Recommended that she provides most recent lipid profile for review/reference  Lightheadedness Has been experiencing lightheaded and dizziness concerning for possible vertigo. No associated nausea or vomiting or focal neurological deficits.  If present she should go to the closest ER via EMS for evaluation of stroke.  Patient states that she has imaging scheduled later this weekend. Orthostatic vital signs negative.  Bruit of right carotid artery Will check carotid duplex  Has elevated blood pressure at today's office visit and other office visits as per the records available.  I suspect she may have benign essential hypertension.  I recommended that she checks her blood pressures at home and to keep a log and to review either myself or PCP.  In addition, once she starts Lopressor and after week later if her pulse is still greater than 65 bpm she is asked to call the office for further medication titration to optimize her coronary CTA images.  Data Reviewed: I have independently reviewed external notes provided by the referring  provider as part of this office visit.   I have independently reviewed results of EKG, labs, as part of medical decision making. I have ordered the following tests:  Orders Placed This Encounter  Procedures   CT CORONARY MORPH W/CTA COR W/SCORE W/CA W/CM &/OR WO/CM    Standing Status:   Future  Standing Expiration Date:   12/08/2023    Order Specific Question:   If indicated for the ordered procedure, I authorize the administration of contrast media per Radiology protocol    Answer:   Yes    Order Specific Question:   Is patient pregnant?    Answer:   No    Order Specific Question:   Preferred Imaging Location?    Answer:   [facility]   Basic metabolic panel   EKG 12-Lead   PCV ECHOCARDIOGRAM COMPLETE    Standing Status:   Future    Standing Expiration Date:   12/08/2023   I have made medications changes at today's encounter as noted above.  FINAL MEDICATION LIST END OF ENCOUNTER: Meds ordered this encounter  Medications   metoprolol tartrate (LOPRESSOR) 25 MG tablet    Sig: Take 1 tablet (25 mg total) by mouth 2 (two) times daily.    Dispense:  60 tablet    Refill:  0   nitroGLYCERIN (NITROSTAT) 0.4 MG SL tablet    Sig: Place 1 tablet (0.4 mg total) under the tongue every 5 (five) minutes as needed for chest pain. If you require more than two tablets five minutes apart go to the nearest ER via EMS.    Dispense:  30 tablet    Refill:  0    Medications Discontinued During This Encounter  Medication Reason   gabapentin (NEURONTIN) 100 MG capsule      Current Outpatient Medications:    Acetaminophen 500 MG capsule,  TAKE 2 TABLETS BY MOUTH EVERY 6 HOURS FOR 7 DAYS, Disp: , Rfl:    dicyclomine (BENTYL) 20 MG tablet, TAKE 1 TABLET(20 MG) BY MOUTH FOUR TIMES DAILY BEFORE MEALS AND AT BEDTIME, Disp: 120 tablet, Rfl: 11   escitalopram (LEXAPRO) 10 MG tablet, Take 10 mg by mouth daily., Disp: , Rfl:    fluticasone (FLONASE) 50 MCG/ACT nasal spray, Place 2 sprays into both  nostrils daily as needed., Disp: , Rfl:    hyoscyamine (LEVSIN SL) 0.125 MG SL tablet, DISSOLVE 1 TO 2 TABLETS UNDER THE TONGUE EVERY 4 HOURS AS NEEDED, Disp: 120 tablet, Rfl: 11   meclizine (ANTIVERT) 25 MG tablet, Take 25 mg by mouth at bedtime., Disp: , Rfl:    methocarbamol (ROBAXIN) 750 MG tablet, Take 750 mg by mouth 2 (two) times daily as needed for muscle spasms., Disp: , Rfl:    metoprolol tartrate (LOPRESSOR) 25 MG tablet, Take 1 tablet (25 mg total) by mouth 2 (two) times daily., Disp: 60 tablet, Rfl: 0   nitroGLYCERIN (NITROSTAT) 0.4 MG SL tablet, Place 1 tablet (0.4 mg total) under the tongue every 5 (five) minutes as needed for chest pain. If you require more than two tablets five minutes apart go to the nearest ER via EMS., Disp: 30 tablet, Rfl: 0   pantoprazole (PROTONIX) 40 MG tablet, TAKE 1 TABLET BY MOUTH TWICE DAILY, Disp: 60 tablet, Rfl: 11   traMADol (ULTRAM) 50 MG tablet, Take 50 mg by mouth 3 (three) times daily as needed for moderate pain. , Disp: , Rfl:    Vitamin D, Ergocalciferol, (DRISDOL) 1.25 MG (50000 UNIT) CAPS capsule, Take 50,000 Units by mouth once a week., Disp: , Rfl:   Orders Placed This Encounter  Procedures   CT CORONARY MORPH W/CTA COR W/SCORE W/CA W/CM &/OR WO/CM   Basic metabolic panel   EKG 12-Lead   PCV ECHOCARDIOGRAM COMPLETE   PCV CAROTID DUPLEX (BILATERAL)    There are no  Patient Instructions on file for this visit.   --Continue cardiac medications as reconciled in final medication list. --Return in about 3 weeks (around 12/29/2022) for Reevaluation of, Chest pain, Review test results. or sooner if needed. --Continue follow-up with your primary care physician regarding the management of your other chronic comorbid conditions.  Patient's questions and concerns were addressed to her satisfaction. She voices understanding of the instructions provided during this encounter.   This note was created using a voice recognition software as a result  there may be grammatical errors inadvertently enclosed that do not reflect the nature of this encounter. Every attempt is made to correct such errors.  Tessa Lerner, [place], [facility]  Pager:  [phone] Office: [phone]

## 2022-12-21 DIAGNOSIS — R072 Precordial pain: Secondary | ICD-10-CM | POA: Diagnosis not present

## 2022-12-22 LAB — BASIC METABOLIC PANEL
BUN/Creatinine Ratio: 14 (ref 9–23)
BUN: 12 mg/dL (ref 6–24)
CO2: 21 mmol/L (ref 20–29)
Calcium: 9.4 mg/dL (ref 8.7–10.2)
Chloride: 104 mmol/L (ref 96–106)
Creatinine, Ser: 0.88 mg/dL (ref 0.57–1.00)
Glucose: 94 mg/dL (ref 70–99)
Potassium: 4 mmol/L (ref 3.5–5.2)
Sodium: 140 mmol/L (ref 134–144)
eGFR: 78 mL/min/{1.73_m2} (ref >59)

## 2022-12-25 DIAGNOSIS — F4323 Adjustment disorder with mixed anxiety and depressed mood: Secondary | ICD-10-CM | POA: Diagnosis not present

## 2022-12-26 ENCOUNTER — Telehealth (HOSPITAL_COMMUNITY): Payer: Self-pay | Admitting: Emergency Medicine

## 2022-12-26 ENCOUNTER — Telehealth (HOSPITAL_COMMUNITY): Payer: Self-pay | Admitting: *Deleted

## 2022-12-26 NOTE — Telephone Encounter (Signed)
Patient returning call about her upcoming cardiac imaging study; pt verbalizes understanding of appt date/time, parking situation and where to check in, pre-test NPO status and medications ordered, and verified current allergies; name and call back number provided for further questions should they arise  Larey Brick RN Navigator Cardiac Imaging [facility] [phone] office [phone] cell  Patient to take 25mg  metoprolol tartrate two hours prior to her cardiac CT scan. She is aware to arrive at 9:30am.

## 2022-12-26 NOTE — Telephone Encounter (Signed)
Attempted to call patient regarding upcoming cardiac CT appointment. °Left message on voicemail with name and callback number °Zhyon Antenucci RN Navigator Cardiac Imaging °[facility] °[phone] Office °[phone] Cell ° °

## 2022-12-27 ENCOUNTER — Ambulatory Visit (HOSPITAL_COMMUNITY)
Admission: RE | Admit: 2022-12-27 | Discharge: 2022-12-27 | Disposition: A | Discharge status: Home / Self Care | Payer: Medicare HMO | Source: Ambulatory Visit | Attending: Cardiology | Admitting: Cardiology

## 2022-12-27 DIAGNOSIS — R072 Precordial pain: Secondary | ICD-10-CM | POA: Insufficient documentation

## 2022-12-27 DIAGNOSIS — R9431 Abnormal electrocardiogram [ECG] [EKG]: Secondary | ICD-10-CM | POA: Diagnosis not present

## 2022-12-27 MED ORDER — IOHEXOL 350 MG/ML SOLN
95.0000 mL | Freq: Once | INTRAVENOUS | Status: AC | PRN
  Administered 2022-12-27: 95 mL via INTRAVENOUS

## 2022-12-27 MED ORDER — NITROGLYCERIN 0.4 MG SL SUBL
0.8000 mg | SUBLINGUAL_TABLET | SUBLINGUAL | Status: DC | PRN
  Administered 2022-12-27: 0.8 mg via SUBLINGUAL

## 2022-12-27 MED ORDER — NITROGLYCERIN 0.4 MG SL SUBL
SUBLINGUAL_TABLET | SUBLINGUAL | Status: AC
  Filled 2022-12-27: qty 2

## 2022-12-27 MED ORDER — DIPHENHYDRAMINE HCL 50 MG/ML IJ SOLN
INTRAMUSCULAR | Status: AC
  Filled 2022-12-27: qty 1

## 2022-12-27 NOTE — Progress Notes (Signed)
Patient tolerated without distress 

## 2022-12-28 DIAGNOSIS — R072 Precordial pain: Secondary | ICD-10-CM | POA: Insufficient documentation

## 2022-12-28 DIAGNOSIS — R9431 Abnormal electrocardiogram [ECG] [EKG]: Secondary | ICD-10-CM | POA: Insufficient documentation

## 2022-12-30 DIAGNOSIS — H547 Unspecified visual loss: Secondary | ICD-10-CM | POA: Diagnosis not present

## 2022-12-30 DIAGNOSIS — M62838 Other muscle spasm: Secondary | ICD-10-CM | POA: Diagnosis not present

## 2022-12-30 DIAGNOSIS — M542 Cervicalgia: Secondary | ICD-10-CM | POA: Diagnosis not present

## 2022-12-30 NOTE — Progress Notes (Signed)
Gave results. Patient verbalized understanding.

## 2023-01-03 ENCOUNTER — Other Ambulatory Visit: Payer: Medicare HMO

## 2023-01-05 ENCOUNTER — Encounter: Payer: Self-pay | Admitting: Family Medicine

## 2023-01-05 DIAGNOSIS — M5412 Radiculopathy, cervical region: Secondary | ICD-10-CM | POA: Diagnosis not present

## 2023-01-06 ENCOUNTER — Ambulatory Visit: Payer: Medicare HMO

## 2023-01-06 DIAGNOSIS — K769 Liver disease, unspecified: Secondary | ICD-10-CM | POA: Diagnosis not present

## 2023-01-06 DIAGNOSIS — R0989 Other specified symptoms and signs involving the circulatory and respiratory systems: Secondary | ICD-10-CM

## 2023-01-06 DIAGNOSIS — R072 Precordial pain: Secondary | ICD-10-CM

## 2023-01-07 ENCOUNTER — Ambulatory Visit
Admission: RE | Admit: 2023-01-07 | Discharge: 2023-01-07 | Disposition: A | Discharge status: Home / Self Care | Payer: Medicare HMO | Source: Ambulatory Visit | Attending: Physician Assistant | Admitting: Physician Assistant

## 2023-01-07 DIAGNOSIS — R42 Dizziness and giddiness: Secondary | ICD-10-CM

## 2023-01-07 DIAGNOSIS — R519 Headache, unspecified: Secondary | ICD-10-CM | POA: Diagnosis not present

## 2023-01-07 DIAGNOSIS — M48061 Spinal stenosis, lumbar region without neurogenic claudication: Secondary | ICD-10-CM | POA: Diagnosis not present

## 2023-01-07 DIAGNOSIS — H539 Unspecified visual disturbance: Secondary | ICD-10-CM | POA: Diagnosis not present

## 2023-01-07 MED ORDER — GADOPICLENOL 0.5 MMOL/ML IV SOLN
9.0000 mL | Freq: Once | INTRAVENOUS | Status: AC | PRN
  Administered 2023-01-07: 9 mL via INTRAVENOUS

## 2023-01-11 DIAGNOSIS — H8111 Benign paroxysmal vertigo, right ear: Secondary | ICD-10-CM | POA: Diagnosis not present

## 2023-01-11 DIAGNOSIS — E538 Deficiency of other specified B group vitamins: Secondary | ICD-10-CM | POA: Diagnosis not present

## 2023-01-11 DIAGNOSIS — F321 Major depressive disorder, single episode, moderate: Secondary | ICD-10-CM | POA: Diagnosis not present

## 2023-01-11 DIAGNOSIS — R030 Elevated blood-pressure reading, without diagnosis of hypertension: Secondary | ICD-10-CM | POA: Diagnosis not present

## 2023-01-11 DIAGNOSIS — E669 Obesity, unspecified: Secondary | ICD-10-CM | POA: Diagnosis not present

## 2023-01-11 DIAGNOSIS — R5382 Chronic fatigue, unspecified: Secondary | ICD-10-CM | POA: Diagnosis not present

## 2023-01-11 DIAGNOSIS — N951 Menopausal and female climacteric states: Secondary | ICD-10-CM | POA: Diagnosis not present

## 2023-01-11 DIAGNOSIS — Z1231 Encounter for screening mammogram for malignant neoplasm of breast: Secondary | ICD-10-CM | POA: Diagnosis not present

## 2023-01-12 ENCOUNTER — Encounter: Payer: Self-pay | Admitting: Cardiology

## 2023-01-12 ENCOUNTER — Ambulatory Visit: Payer: Medicare HMO | Admitting: Cardiology

## 2023-01-12 VITALS — BP 134/76 | HR 70 | Ht 63.0 in | Wt 194.0 lb

## 2023-01-12 DIAGNOSIS — R9431 Abnormal electrocardiogram [ECG] [EKG]: Secondary | ICD-10-CM

## 2023-01-12 DIAGNOSIS — R072 Precordial pain: Secondary | ICD-10-CM | POA: Diagnosis not present

## 2023-01-12 DIAGNOSIS — I10 Essential (primary) hypertension: Secondary | ICD-10-CM

## 2023-01-12 MED ORDER — LOSARTAN POTASSIUM-HCTZ 50-12.5 MG PO TABS
1.0000 | ORAL_TABLET | Freq: Every day | ORAL | 0 refills | Status: DC
Start: 2023-01-12 — End: 2023-01-13

## 2023-01-12 NOTE — Progress Notes (Signed)
ID:  [name], DOB 1965/12/06, MRN [id number]  PCP:  Laurann Montana, MD  Cardiologist:  Tessa Lerner, DO, [facility] (established care 01/12/23)  Date: 01/12/23 Last Office Visit: 12/08/2022  Chief Complaint  Patient presents with   Chest Pain   Follow-up   Results    HPI  [name] is a 57 y.o. African-American female whose past medical history and cardiovascular risk factors include: Benign essential hypertension, depression, irritable bowel syndrome, chronic fatigue, GERD, necrotizing granuloma of the liver  Patient was referred to the practice for evaluation of abnormal EKG and chest pain.  Her symptoms of precordial discomfort were concerning for cardiac etiology and therefore echo and coronary CTA were recommended.  Echo notes low normal LVEF without any significant valvular heart disease and coronary CTA notes a total CAC of 0 and no evidence of epicardial obstructive CAD.  Patient's blood pressures have also been elevated at home with SBP consistently greater than 140 mmHg associated with headaches.  Patient is willing to start antihypertensive medications.  Results of the carotid duplex also reviewed with the patient at today's office visit.  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  No family history of premature coronary artery disease, sudden cardiac death, or cardiomyopathy.  CARDIAC DATABASE: EKG: December 08, 2022: Sinus rhythm, 78 bpm, normal axis, TWI consider anterior/lateral ischemia.  Echocardiogram: 01/06/2023 Normal LV systolic function with visual EF 50-55%. Left ventricle cavity is normal in size. Normal left ventricular wall thickness. Normal global wall motion. Normal diastolic filling pattern, normal LAP.  Mild (Grade I) mitral regurgitation. No prior study for comparison.  Coronary CTA: July 2024: 1. Total coronary calcium score of 0.   2. Normal coronary origin with right dominance.   3. CAD-RADS = 0 No evidence of  CAD.  Radiology over read: 1. No acute or significant extracardiac abnormality.   RECOMMENDATIONS:   Consider non-atherosclerotic causes of chest pain.  Carotid artery duplex 01/06/2023:  The bifurcation, internal, external and common carotid arteries reveal no evidence of significant stenosis, bilaterally.  Antegrade right vertebral artery flow. Antegrade left vertebral artery flow.      ALLERGIES: No Known Allergies  MEDICATION LIST PRIOR TO VISIT: Current Meds  Medication Sig   Acetaminophen 500 MG capsule  TAKE 2 TABLETS BY MOUTH EVERY 6 HOURS FOR 7 DAYS   Cyanocobalamin 2500 MCG SUBL once a week.   dicyclomine (BENTYL) 20 MG tablet TAKE 1 TABLET(20 MG) BY MOUTH FOUR TIMES DAILY BEFORE MEALS AND AT BEDTIME   escitalopram (LEXAPRO) 10 MG tablet Take 10 mg by mouth daily.   fluticasone (FLONASE) 50 MCG/ACT nasal spray Place 2 sprays into both nostrils daily as needed.   hyoscyamine (LEVSIN SL) 0.125 MG SL tablet DISSOLVE 1 TO 2 TABLETS UNDER THE TONGUE EVERY 4 HOURS AS NEEDED   losartan-hydrochlorothiazide (HYZAAR) 50-12.5 MG tablet Take 1 tablet by mouth daily.   meclizine (ANTIVERT) 25 MG tablet Take 25 mg by mouth at bedtime.   methocarbamol (ROBAXIN) 750 MG tablet Take 750 mg by mouth 2 (two) times daily as needed for muscle spasms.   nitroGLYCERIN (NITROSTAT) 0.4 MG SL tablet Place 1 tablet (0.4 mg total) under the tongue every 5 (five) minutes as needed for chest pain. If you require more than two tablets five minutes apart go to the nearest ER via EMS.   pantoprazole (PROTONIX) 40 MG tablet TAKE 1 TABLET BY MOUTH TWICE DAILY   traMADol (ULTRAM) 50 MG tablet Take  50 mg by mouth 3 (three) times daily as needed for moderate pain.    Vitamin D, Ergocalciferol, (DRISDOL) 1.25 MG (50000 UNIT) CAPS capsule Take 50,000 Units by mouth once a week.   [DISCONTINUED] metoprolol tartrate (LOPRESSOR) 25 MG tablet Take 1 tablet (25 mg total) by mouth 2 (two) times daily.     PAST  MEDICAL HISTORY: 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   Irritable bowel syndrome    Overweight(278.02)    Personal history of colonic polyps 09/19/2011   tubular adenoma   Sarcoidosis 03/20/2019   Segmental colitis (HCC)    Tubular adenoma of colon     PAST SURGICAL HISTORY: Past Surgical History:  Procedure Laterality Date   COLONOSCOPY     FACET JOINT INJECTION  03/28/11   injections in neck   hysterectomy  2000   IR SACROPLASTY BILATERAL  06/18/2019   KNEE SURGERY Right     Right knee arthroscopic partial lateral meniscectomy (01/22/07),arthroscopic partial lateral meniscectomy 5/09   POLYPECTOMY     SPINE SURGERY  10/13/06   Posterior interbody fusion (PLIF) L5-S1 (07/04/07)   WRIST FRACTURE SURGERY Left 2011   Due to injury at work in 2008.    FAMILY HISTORY: The patient family history includes Asthma in her mother; COPD in her mother; Cancer in her mother; Esophageal cancer in her father; Glaucoma in her mother; Hyperlipidemia in her mother; Hypertension in her maternal aunt; Stomach cancer in her mother.  SOCIAL HISTORY:  The patient  reports that she has never smoked. She has never used smokeless tobacco. She reports that she does not drink alcohol and does not use drugs.  REVIEW OF SYSTEMS: Review of Systems  Cardiovascular:  Negative for chest pain, claudication, dyspnea on exertion, irregular heartbeat, leg swelling, near-syncope, orthopnea, palpitations, paroxysmal nocturnal dyspnea and syncope.  Respiratory:  Negative for shortness of breath.   Hematologic/Lymphatic: Negative for bleeding problem.  Musculoskeletal:  Negative for muscle cramps and myalgias.  Neurological:  Positive for headaches. Negative for dizziness and light-headedness.    PHYSICAL EXAM:    01/12/2023   10:11 AM 12/27/2022   11:04 AM 12/27/2022    9:49 AM  Vitals with BMI  Height 5\' 3"     Weight  194 lbs    BMI 34.37    Systolic 134 158 161  Diastolic 76 82 84  Pulse 70 67 67    Physical Exam  Constitutional: No distress.  HENT:  Nose: Nose normal. No nasal discharge.  Eyes: Conjunctivae are normal.  Neck: No JVD present.  Cardiovascular: Normal rate, regular rhythm, S1 normal and S2 normal. Exam reveals no gallop, no S3 and no S4.  No murmur heard. Pulses:      Carotid pulses are 2+ on the right side with bruit and 2+ on the left side.      Radial pulses are 2+ on the right side and 2+ on the left side.       Femoral pulses are 2+ on the right side and 2+ on the left side.      Popliteal pulses are 2+ on the right side and 2+ on the left side.       Dorsalis pedis pulses are 2+ on the right side and 2+ on the left side.       Posterior tibial pulses are 2+ on the right side and 2+ on  the left side.  Pulmonary/Chest: Effort normal and breath sounds normal. No stridor. She has no wheezes. She has no rales. She exhibits no tenderness.  Abdominal: Soft. Bowel sounds are normal. She exhibits no distension. There is no abdominal tenderness.  Musculoskeletal:        General: No tenderness or edema. Normal range of motion.     Cervical back: Neck supple.  Neurological: She is alert and oriented to person, place, and time.  Skin: Skin is warm and dry. No rash noted. No cyanosis. No jaundice. Nails show no clubbing.    LABORATORY DATA: External Labs: Collected: Nov 24 2022 provided by PCP. TSH 0.65. BUN 16, creatinine 0.84. AST 15, ALT 7, alkaline phosphatase 93. Hemoglobin 11.3, hematocrit 34.1%   Collected: July 12, 2022 provided by PCP. BUN 19, creatinine 0.92. Sodium 140, potassium 4, chloride 103, bicarb 30 AST 23, ALT 27, alkaline phosphatase 90. Hemoglobin 12.7, hematocrit 38.7%  IMPRESSION:    ICD-10-CM   1. Benign hypertension  I10 losartan-hydrochlorothiazide (HYZAAR) 50-12.5 MG tablet    Basic metabolic panel    2. Precordial pain  R07.2     3.  Abnormal EKG  R94.31         RECOMMENDATIONS: [name] is a 57 y.o. African-American female whose past medical history and cardiac risk factors include: Benign essential hypertension, depression, irritable bowel syndrome, chronic fatigue, GERD, necrotizing granuloma of the liver  Benign hypertension Newly discovered. Patient has kept a blood pressure log at home since last office visit and SBP's have consistently been above 140 mmHg with associated headaches which may be contributory. Patient is agreeable with regards to starting antihypertensive medications. Will start Hyzaar 50/12.5 mg p.o. daily. Labs in 1 week to evaluate kidney function and electrolytes. Reemphasized importance of low-salt diet and looking into Mediterranean/DASH diet and plant-based diet.  Precordial pain No recurrence of chest pain since last office visit. Results of the echo as well as the coronary CTA reviewed with the patient.  Abnormal EKG Monitor for now.   Has undergone ischemic workup as outlined above.    FINAL MEDICATION LIST END OF ENCOUNTER: Meds ordered this encounter  Medications   losartan-hydrochlorothiazide (HYZAAR) 50-12.5 MG tablet    Sig: Take 1 tablet by mouth daily.    Dispense:  30 tablet    Refill:  0    Medications Discontinued During This Encounter  Medication Reason   metoprolol tartrate (LOPRESSOR) 25 MG tablet       Current Outpatient Medications:    Acetaminophen 500 MG capsule,  TAKE 2 TABLETS BY MOUTH EVERY 6 HOURS FOR 7 DAYS, Disp: , Rfl:    Cyanocobalamin 2500 MCG SUBL, once a week., Disp: , Rfl:    dicyclomine (BENTYL) 20 MG tablet, TAKE 1 TABLET(20 MG) BY MOUTH FOUR TIMES DAILY BEFORE MEALS AND AT BEDTIME, Disp: 120 tablet, Rfl: 11   escitalopram (LEXAPRO) 10 MG tablet, Take 10 mg by mouth daily., Disp: , Rfl:    fluticasone (FLONASE) 50 MCG/ACT nasal spray, Place 2 sprays into both nostrils daily as needed., Disp: , Rfl:    hyoscyamine (LEVSIN SL) 0.125  MG SL tablet, DISSOLVE 1 TO 2 TABLETS UNDER THE TONGUE EVERY 4 HOURS AS NEEDED, Disp: 120 tablet, Rfl: 11   losartan-hydrochlorothiazide (HYZAAR) 50-12.5 MG tablet, Take 1 tablet by mouth daily., Disp: 30 tablet, Rfl: 0   meclizine (ANTIVERT) 25 MG tablet, Take 25 mg by mouth at bedtime., Disp: , Rfl:    methocarbamol (ROBAXIN) 750 MG  tablet, Take 750 mg by mouth 2 (two) times daily as needed for muscle spasms., Disp: , Rfl:    nitroGLYCERIN (NITROSTAT) 0.4 MG SL tablet, Place 1 tablet (0.4 mg total) under the tongue every 5 (five) minutes as needed for chest pain. If you require more than two tablets five minutes apart go to the nearest ER via EMS., Disp: 30 tablet, Rfl: 0   pantoprazole (PROTONIX) 40 MG tablet, TAKE 1 TABLET BY MOUTH TWICE DAILY, Disp: 60 tablet, Rfl: 11   traMADol (ULTRAM) 50 MG tablet, Take 50 mg by mouth 3 (three) times daily as needed for moderate pain. , Disp: , Rfl:    Vitamin D, Ergocalciferol, (DRISDOL) 1.25 MG (50000 UNIT) CAPS capsule, Take 50,000 Units by mouth once a week., Disp: , Rfl:   Orders Placed This Encounter  Procedures   Basic metabolic panel    There are no Patient Instructions on file for this visit.   --Continue cardiac medications as reconciled in final medication list. --Return in about 3 months (around 04/14/2023) for Follow up, BP. or sooner if needed. --Continue follow-up with your primary care physician regarding the management of your other chronic comorbid conditions.  Patient's questions and concerns were addressed to her satisfaction. She voices understanding of the instructions provided during this encounter.   This note was created using a voice recognition software as a result there may be grammatical errors inadvertently enclosed that do not reflect the nature of this encounter. Every attempt is made to correct such errors.  Tessa Lerner, [place], [facility]  Pager:  [phone] Office: [phone]

## 2023-01-13 ENCOUNTER — Other Ambulatory Visit: Payer: Self-pay

## 2023-01-13 DIAGNOSIS — I10 Essential (primary) hypertension: Secondary | ICD-10-CM

## 2023-01-13 MED ORDER — LOSARTAN POTASSIUM-HCTZ 50-12.5 MG PO TABS
1.0000 | ORAL_TABLET | Freq: Every day | ORAL | 0 refills | Status: DC
Start: 2023-01-13 — End: 2023-02-17

## 2023-01-18 DIAGNOSIS — K753 Granulomatous hepatitis, not elsewhere classified: Secondary | ICD-10-CM | POA: Diagnosis not present

## 2023-01-19 ENCOUNTER — Other Ambulatory Visit: Payer: Self-pay | Admitting: Family Medicine

## 2023-01-19 DIAGNOSIS — R937 Abnormal findings on diagnostic imaging of other parts of musculoskeletal system: Secondary | ICD-10-CM

## 2023-01-25 DIAGNOSIS — F4323 Adjustment disorder with mixed anxiety and depressed mood: Secondary | ICD-10-CM | POA: Diagnosis not present

## 2023-01-31 DIAGNOSIS — M5412 Radiculopathy, cervical region: Secondary | ICD-10-CM | POA: Diagnosis not present

## 2023-02-01 ENCOUNTER — Ambulatory Visit: Payer: Medicare HMO | Admitting: Physical Therapy

## 2023-02-12 ENCOUNTER — Other Ambulatory Visit: Payer: Medicare HMO

## 2023-02-12 DIAGNOSIS — R937 Abnormal findings on diagnostic imaging of other parts of musculoskeletal system: Secondary | ICD-10-CM

## 2023-02-12 DIAGNOSIS — M5412 Radiculopathy, cervical region: Secondary | ICD-10-CM | POA: Diagnosis not present

## 2023-02-12 MED ORDER — GADOPICLENOL 0.5 MMOL/ML IV SOLN
9.0000 mL | Freq: Once | INTRAVENOUS | Status: AC | PRN
  Administered 2023-02-12: 9 mL via INTRAVENOUS

## 2023-02-13 ENCOUNTER — Ambulatory Visit: Payer: Medicare HMO | Attending: Family Medicine

## 2023-02-13 DIAGNOSIS — R42 Dizziness and giddiness: Secondary | ICD-10-CM | POA: Diagnosis not present

## 2023-02-13 DIAGNOSIS — R2681 Unsteadiness on feet: Secondary | ICD-10-CM | POA: Insufficient documentation

## 2023-02-13 NOTE — Therapy (Signed)
OUTPATIENT PHYSICAL THERAPY VESTIBULAR EVALUATION     Patient Name: [name] MRN: [id number] DOB:14-Aug-1965, 57 y.o., female Today's Date: 02/13/2023  END OF SESSION:  PT End of Session - 02/13/23 1402     Visit Number 1    Number of Visits 13    Date for PT Re-Evaluation 04/14/23   to allow for scheduling conflicts   Authorization Type [employer]    PT Start Time 1400    PT Stop Time 1450    PT Time Calculation (min) 50 min    Activity Tolerance Patient tolerated treatment well    Behavior During Therapy [facility] for tasks assessed/performed             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   Irritable bowel syndrome    Overweight(278.02)    Personal history of colonic polyps 09/19/2011   tubular adenoma   Sarcoidosis 03/20/2019   Segmental colitis (HCC)    Tubular adenoma of colon    Past Surgical History:  Procedure Laterality Date   COLONOSCOPY     FACET JOINT INJECTION  03/28/11   injections in neck   hysterectomy  2000   IR SACROPLASTY BILATERAL  06/18/2019   KNEE SURGERY Right     Right knee arthroscopic partial lateral meniscectomy (01/22/07),arthroscopic partial lateral meniscectomy 5/09   POLYPECTOMY     SPINE SURGERY  10/13/06   Posterior interbody fusion (PLIF) L5-S1 (07/04/07)   WRIST FRACTURE SURGERY Left 2011   Due to injury at work in 2008.   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   Pain in joint of left elbow 08/29/2017   Pain in joint of right elbow 08/29/2017   Pain of left shoulder joint on movement 08/29/2017   Hx of adenomatous colonic polyps 11/14/2014   Chronic pain syndrome 09/04/2013   Epigastric abdominal tenderness 08/16/2013   Elbow pain, right  06/30/2013   Fibromyalgia 04/19/2012   IBS (irritable bowel syndrome) 04/19/2012   Joint pain 04/19/2012   Benign neoplasm of colon 09/19/2011   Colitis, nonspecific 09/19/2011   Hematochezia 09/19/2011   Abdominal pain, other specified site 09/08/2011   GERD (gastroesophageal reflux disease) 09/08/2011   General medical examination 04/13/2011   Cervical disc disease 03/15/2011   Multiple thyroid nodules 02/14/2011    PCP: Laurann Montana, MD REFERRING PROVIDER: Laurann Montana, MD  REFERRING DIAG: H81.10 (ICD-10-CM) - Benign paroxysmal vertigo, unspecified ear  THERAPY DIAG:  Dizziness and giddiness  Unsteadiness on feet  ONSET DATE: 01/12/2023  Rationale for Evaluation and Treatment: Rehabilitation  SUBJECTIVE:   SUBJECTIVE STATEMENT: Patient arrived to clinic alone, drove herself. Does have a SPC, but didn't bring it today. Reports having dizziness episodes for a 2 months, but this episode started at night in bed and turning onto her R. Does experience dizziness when turning L, but not as bad. Patient did get a wedge to sleep and that has happened. Patient does report onset of HA with neck pain with the "vertigo." Does take Meclizine and took it last at night. Has had 2 episodes of sitting up and having blurred vision that took a few mins to resolve. No h/o of migraines reported.  Pt  accompanied by: self  PERTINENT HISTORY: anemia, arthritis, GERD, sarcoidosis  PAIN:  Are you having pain?  Does report "usual neck pain"  PRECAUTIONS: Fall   WEIGHT BEARING RESTRICTIONS: No  FALLS: Has patient fallen in last 6 months? Yes. Number of falls 1; L knee "gave out" in [employer] parking lot   LIVING ENVIRONMENT: Lives with: lives with their family Lives in: House/apartment Stairs: Yes: Internal: flight steps; on right going up Has following equipment at home: Single point cane and shower chair  PLOF: Independent; disabled   PATIENT GOALS: "to not be dizzy"  OBJECTIVE:    DIAGNOSTIC FINDINGS: 1. Normal MRI appearance of the brain. No acute or focal lesion to explain the patient's symptoms. 2. C3-4 disc protrusion with mild central canal stenosis. 3. Abnormal signal within the C3 vertebral body. This may be degenerative. Recommend MRI of the cervical spine without and with contrast for further evaluation. 4. Mild sphenoid sinus disease.  COGNITION: Overall cognitive status: Within functional limits for tasks assessed   SENSATION: WFL  POSTURE:  No Significant postural limitations  Cervical ROM:   Slightly limited, reports "stiffness", incr dizziness with looking up and L  STRENGTH: WFL  BED MOBILITY:  Reports independent, incr dizziness with rolling R  TRANSFERS: Assistive device utilized: None  Sit to stand: Modified independence Stand to sit: Modified independence Chair to chair: Modified independence   GAIT: Gait pattern: step through pattern and trendelenburg Distance walked: clinic Assistive device utilized: None Level of assistance: Modified independence  PATIENT SURVEYS:  FOTO not captured  VESTIBULAR ASSESSMENT:  GENERAL OBSERVATION: NAD, no AD   SYMPTOM BEHAVIOR:  Subjective history: see above  Non-Vestibular symptoms: changes in hearing, changes in vision, neck pain, headaches, and nausea/vomiting R ear muffled, photosensitivity, mild B vision blurriness  Type of dizziness: Spinning/Vertigo and "Funny feeling in the head"  Frequency: every time she rolls R and does a sit up  Duration: ~5 mins  Aggravating factors: Spontaneous, Induced by position change: lying supine and rolling to the right, and going up stairs/ looking up  Relieving factors: medication and rest  Progression of symptoms: worse  OCULOMOTOR EXAM:  Ocular Alignment: normal  Ocular ROM: No Limitations  Spontaneous Nystagmus: absent  Gaze-Induced Nystagmus: absent  Smooth Pursuits: saccades lateral  Saccades: dysmetria moving  R  Convergence/Divergence: ~7 cm   VESTIBULAR - OCULAR REFLEX:   Slow VOR: Comment: unable to maintain focus  VOR Cancellation: Unable to Maintain Gaze  Head-Impulse Test: HIT Right: positive HIT Left: negative     POSITIONAL TESTING: Right Dix-Hallpike: Patient reporting up to 10/10 dizziness with delayed onset lasting ~20s, but unable to visualize nystagmus as patient took meclizine <12 hours ago  MOTION SENSITIVITY:  Motion Sensitivity Quotient Intensity: 0 = none, 1 = Lightheaded, 2 = Mild, 3 = Moderate, 4 = Severe, 5 = Vomiting  Intensity  1. Sitting to supine   2. Supine to L side   3. Supine to R side   4. Supine to sitting   5. L Hallpike-Dix   6. Up from L    7. R Hallpike-Dix   8. Up from R    9. Sitting, head tipped to L knee   10. Head up from L knee   11. Sitting, head tipped to R knee   12. Head up from R knee   13. Sitting head turns x5   14.Sitting head nods x5   15. In stance, 180 turn to L    16. In stance,  180 turn to R      VESTIBULAR TREATMENT:                                                                                                   N/a eval  PATIENT EDUCATION: Education details: PT POC, exam findings, not taking meclizine before next session (can bring with and have someone else pick her up), BPPV vs hypofunction vs other vestibular dx Person educated: Patient Education method: Explanation Education comprehension: verbalized understanding and needs further education  HOME EXERCISE PROGRAM: To be provided  GOALS: Goals reviewed with patient? Yes  SHORT TERM GOALS: Target date: 03/17/23  Pt will be independent with initial HEP for improved symptoms Baseline: to be provided Goal status: INITIAL  2.  Patient will demonstrate (-) positional testing to indicate resolution of BPPV  Baseline: needs to be retested without meclizine Goal status: INITIAL  3.  FOTO goal Baseline: to be assessed Goal status: INITIAL  4. DVA  goal  Baseline: to be assessed  Goal status: INITIAL   LONG TERM GOALS: Target date: 04/14/23  Pt will be independent with initial HEP for improved symptom report Baseline: to be provided  Goal status: INITIAL  2.  MSQ goal Baseline: to be assessed Goal status: INITIAL  3.  FOTO goal Baseline: to be assessed  Goal status: INITIAL  4. DVA goal   Baseline: to be assessed  Goal status: INITIAL  ASSESSMENT:  CLINICAL IMPRESSION: Patient is a 57 y.o. female who was seen today for physical therapy evaluation and treatment for dizziness. Exam results and symptom report suggestive of R vestibular hypofunction, possibly L as well, however, patient with difficulty keeping her eyes open adequately for PT to accurately observe eye movements. PT performing R dix hallpike- patient having delayed onset reports of dizziness, up to 10/10. PT unable to observe nystagmus due to patient taking meclizine <12 hours ago, thus PT hesitant to tx. This will be reassessed without meclizine in her system. She would benefit from skilled PT services to address the above mentioned deficits.    OBJECTIVE IMPAIRMENTS: decreased balance, decreased knowledge of condition, and dizziness.   ACTIVITY LIMITATIONS: stairs, bed mobility, reach over head, locomotion level, and caring for others  PARTICIPATION LIMITATIONS: cleaning, interpersonal relationship, driving, shopping, and community activity  PERSONAL FACTORS: Age, Past/current experiences, Sex, Time since onset of injury/illness/exacerbation, Transportation, and 1-2 comorbidities: see above  are also affecting patient's functional outcome.   REHAB POTENTIAL: Good  CLINICAL DECISION MAKING: Evolving/moderate complexity  EVALUATION COMPLEXITY: Moderate   PLAN:  PT FREQUENCY: 2x/week  PT DURATION: 6 weeks  PLANNED INTERVENTIONS: Therapeutic exercises, Therapeutic activity, Neuromuscular re-education, Balance training, Gait training, Patient/Family  education, Self Care, Joint mobilization, Stair training, Vestibular training, Canalith repositioning, Visual/preceptual remediation/compensation, Orthotic/Fit training, DME instructions, Aquatic Therapy, Manual therapy, and Re-evaluation  PLAN FOR NEXT SESSION: FOTO, MSQ, re-assess positional testing without meclizine in her system, DVA   Westley Foots, PT Westley Foots, PT, DPT, CBIS  02/13/2023, 2:59 PM

## 2023-02-17 ENCOUNTER — Other Ambulatory Visit: Payer: Self-pay

## 2023-02-17 DIAGNOSIS — I10 Essential (primary) hypertension: Secondary | ICD-10-CM

## 2023-02-17 MED ORDER — LOSARTAN POTASSIUM-HCTZ 50-12.5 MG PO TABS
1.0000 | ORAL_TABLET | Freq: Every day | ORAL | 0 refills | Status: DC
Start: 2023-02-17 — End: 2023-05-04

## 2023-02-21 ENCOUNTER — Ambulatory Visit: Payer: Medicare HMO

## 2023-02-23 ENCOUNTER — Ambulatory Visit: Payer: Medicare HMO

## 2023-02-23 DIAGNOSIS — R2681 Unsteadiness on feet: Secondary | ICD-10-CM | POA: Diagnosis not present

## 2023-02-23 DIAGNOSIS — R42 Dizziness and giddiness: Secondary | ICD-10-CM

## 2023-02-23 NOTE — Therapy (Signed)
OUTPATIENT PHYSICAL THERAPY VESTIBULAR TREATMENT     Patient Name: [name] MRN: [id number] DOB:1966/06/11, 57 y.o., female Today's Date: 02/23/2023  END OF SESSION:  PT End of Session - 02/23/23 1148     Visit Number 2    Number of Visits 13    Date for PT Re-Evaluation 04/14/23    Authorization Type [employer] medic[employer]    PT Start Time 1146    PT Stop Time 1219   BPPV tx   PT Time Calculation (min) 33 min    Activity Tolerance Patient tolerated treatment well    Behavior During Therapy [facility] for tasks assessed/performed             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   Irritable bowel syndrome    Overweight(278.02)    Personal history of colonic polyps 09/19/2011   tubular adenoma   Sarcoidosis 03/20/2019   Segmental colitis (HCC)    Tubular adenoma of colon    Past Surgical History:  Procedure Laterality Date   COLONOSCOPY     FACET JOINT INJECTION  03/28/11   injections in neck   hysterectomy  2000   IR SACROPLASTY BILATERAL  06/18/2019   KNEE SURGERY Right     Right knee arthroscopic partial lateral meniscectomy (01/22/07),arthroscopic partial lateral meniscectomy 5/09   POLYPECTOMY     SPINE SURGERY  10/13/06   Posterior interbody fusion (PLIF) L5-S1 (07/04/07)   WRIST FRACTURE SURGERY Left 2011   Due to injury at work in 2008.   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   Pain in joint of left elbow 08/29/2017   Pain in joint of right elbow 08/29/2017   Pain of left shoulder joint on movement 08/29/2017   Hx of adenomatous colonic polyps 11/14/2014   Chronic pain syndrome 09/04/2013   Epigastric abdominal tenderness 08/16/2013   Elbow pain, right 06/30/2013   Fibromyalgia  04/19/2012   IBS (irritable bowel syndrome) 04/19/2012   Joint pain 04/19/2012   Benign neoplasm of colon 09/19/2011   Colitis, nonspecific 09/19/2011   Hematochezia 09/19/2011   Abdominal pain, other specified site 09/08/2011   GERD (gastroesophageal reflux disease) 09/08/2011   General medical examination 04/13/2011   Cervical disc disease 03/15/2011   Multiple thyroid nodules 02/14/2011    PCP: Laurann Montana, MD REFERRING PROVIDER: Laurann Montana, MD  REFERRING DIAG: H81.10 (ICD-10-CM) - Benign paroxysmal vertigo, unspecified ear  THERAPY DIAG:  Dizziness and giddiness  Unsteadiness on feet  ONSET DATE: 01/12/2023  Rationale for Evaluation and Treatment: Rehabilitation  SUBJECTIVE:   SUBJECTIVE STATEMENT: Patient reports doing well. Did not take her meclizine. Still getting dizziness primarily in bed. Does endorse some nausea.  Pt accompanied by: self  PERTINENT HISTORY: anemia, arthritis, GERD, sarcoidosis  PAIN:  Are you having pain?  Does report "usual neck pain"  PRECAUTIONS: Fall   PATIENT GOALS: "to not be dizzy"  OBJECTIVE:   DIAGNOSTIC FINDINGS: 1. Normal MRI appearance of the brain. No acute or focal lesion to explain the patient's symptoms. 2. C3-4 disc protrusion with mild central canal stenosis. 3. Abnormal signal within the C3 vertebral body. This may be degenerative. Recommend MRI of the cervical spine without and  with contrast for further evaluation. 4. Mild sphenoid sinus disease.   VESTIBULAR ASSESSMENT   POSITIONAL TESTING: Right Dix-Hallpike: upbeating, right nystagmus Left Dix-Hallpike: no nystagmus Right Roll Test: no nystagmus Left Roll Test: no nystagmus  MOTION SENSITIVITY:  Motion Sensitivity Quotient Intensity: 0 = none, 1 = Lightheaded, 2 = Mild, 3 = Moderate, 4 = Severe, 5 = Vomiting  Intensity  1. Sitting to supine 4  2. Supine to L side 3  3. Supine to R side 3  4. Supine to sitting 3  5. L Hallpike-Dix 0  6. Up  from L  2  7. R Hallpike-Dix 4  8. Up from R  4  9. Sitting, head tipped to L knee   10. Head up from L knee   11. Sitting, head tipped to R knee   12. Head up from R knee   13. Sitting head turns x5   14.Sitting head nods x5   15. In stance, 180 turn to L    16. In stance, 180 turn to R      VESTIBULAR TREATMENT:                                                                                                   R epley x1 with resolution of symptoms   PATIENT EDUCATION: Education details: exam findings, pathophys of BPPV, self-Epley PRN Person educated: Patient Education method: Explanation and Handouts Education comprehension: verbalized understanding and needs further education  HOME EXERCISE PROGRAM: Self epley PRN  GOALS: Goals reviewed with patient? Yes  SHORT TERM GOALS: Target date: 03/17/23  Pt will be independent with initial HEP for improved symptoms Baseline: to be provided Goal status: INITIAL  2.  Patient will demonstrate (-) positional testing to indicate resolution of BPPV  Baseline: needs to be retested without meclizine Goal status: INITIAL  3.  FOTO goal Baseline: to be assessed Goal status: INITIAL  4. DVA goal  Baseline: to be assessed  Goal status: INITIAL   LONG TERM GOALS: Target date: 04/14/23  Pt will be independent with initial HEP for improved symptom report Baseline: to be provided  Goal status: INITIAL  2.  MSQ goal Baseline: to be assessed Goal status: INITIAL  3.  FOTO goal Baseline: to be assessed  Goal status: INITIAL  4. DVA goal   Baseline: to be assessed  Goal status: INITIAL  ASSESSMENT:  CLINICAL IMPRESSION: Patient seen for skilled PT session with emphasis on positional testing and tx. Without meclizine in her system, patient (+) for R posterior canal canalithiasis. Treated with Epley x1 with mild/moderate dizziness reported in positions 2+3. Resolved upon recheck. Continue POC.   OBJECTIVE IMPAIRMENTS:  decreased balance, decreased knowledge of condition, and dizziness.   ACTIVITY LIMITATIONS: stairs, bed mobility, reach over head, locomotion level, and caring for others  PARTICIPATION LIMITATIONS: cleaning, interpersonal relationship, driving, shopping, and community activity  PERSONAL FACTORS: Age, Past/current experiences, Sex, Time since onset of injury/illness/exacerbation, Transportation, and 1-2 comorbidities: see above  are also affecting patient's functional outcome.   REHAB POTENTIAL: Good  CLINICAL DECISION MAKING: Evolving/moderate complexity  EVALUATION COMPLEXITY: Moderate   PLAN:  PT FREQUENCY: 2x/week  PT DURATION: 6 weeks  PLANNED INTERVENTIONS: Therapeutic exercises, Therapeutic activity, Neuromuscular re-education, Balance training, Gait training, Patient/Family education, Self Care, Joint mobilization, Stair training, Vestibular training, Canalith repositioning, Visual/preceptual remediation/compensation, Orthotic/Fit training, DME instructions, Aquatic Therapy, Manual therapy, and Re-evaluation  PLAN FOR NEXT SESSION: FOTO, MSQ, re-assess positional testing without meclizine in her system, DVA   Westley Foots, PT Westley Foots, PT, DPT, CBIS  02/23/2023, 12:42 PM

## 2023-02-25 DIAGNOSIS — F4323 Adjustment disorder with mixed anxiety and depressed mood: Secondary | ICD-10-CM | POA: Diagnosis not present

## 2023-02-28 ENCOUNTER — Ambulatory Visit: Payer: Medicare HMO | Attending: Family Medicine

## 2023-02-28 DIAGNOSIS — R2681 Unsteadiness on feet: Secondary | ICD-10-CM | POA: Diagnosis not present

## 2023-02-28 DIAGNOSIS — R42 Dizziness and giddiness: Secondary | ICD-10-CM | POA: Insufficient documentation

## 2023-02-28 DIAGNOSIS — E538 Deficiency of other specified B group vitamins: Secondary | ICD-10-CM | POA: Diagnosis not present

## 2023-02-28 NOTE — Therapy (Signed)
OUTPATIENT PHYSICAL THERAPY VESTIBULAR TREATMENT     Patient Name: [name] MRN: [id number] DOB:July 17, 1965, 57 y.o., female Today's Date: 02/28/2023  END OF SESSION:  PT End of Session - 02/28/23 1013     Visit Number 3    Number of Visits 13    Date for PT Re-Evaluation 04/14/23    Authorization Type [employer]    PT Start Time 1015    PT Stop Time 1057    PT Time Calculation (min) 42 min    Activity Tolerance Patient tolerated treatment well    Behavior During Therapy [facility] for tasks assessed/performed             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   Irritable bowel syndrome    Overweight(278.02)    Personal history of colonic polyps 09/19/2011   tubular adenoma   Sarcoidosis 03/20/2019   Segmental colitis (HCC)    Tubular adenoma of colon    Past Surgical History:  Procedure Laterality Date   COLONOSCOPY     FACET JOINT INJECTION  03/28/11   injections in neck   hysterectomy  2000   IR SACROPLASTY BILATERAL  06/18/2019   KNEE SURGERY Right     Right knee arthroscopic partial lateral meniscectomy (01/22/07),arthroscopic partial lateral meniscectomy 5/09   POLYPECTOMY     SPINE SURGERY  10/13/06   Posterior interbody fusion (PLIF) L5-S1 (07/04/07)   WRIST FRACTURE SURGERY Left 2011   Due to injury at work in 2008.   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   Pain in joint of left elbow 08/29/2017   Pain in joint of right elbow 08/29/2017   Pain of left shoulder joint on movement 08/29/2017   Hx of adenomatous colonic polyps 11/14/2014   Chronic pain syndrome 09/04/2013   Epigastric abdominal tenderness 08/16/2013   Elbow pain, right 06/30/2013   Fibromyalgia 04/19/2012    IBS (irritable bowel syndrome) 04/19/2012   Joint pain 04/19/2012   Benign neoplasm of colon 09/19/2011   Colitis, nonspecific 09/19/2011   Hematochezia 09/19/2011   Abdominal pain, other specified site 09/08/2011   GERD (gastroesophageal reflux disease) 09/08/2011   General medical examination 04/13/2011   Cervical disc disease 03/15/2011   Multiple thyroid nodules 02/14/2011    PCP: Laurann Montana, MD REFERRING PROVIDER: Laurann Montana, MD  REFERRING DIAG: H81.10 (ICD-10-CM) - Benign paroxysmal vertigo, unspecified ear  THERAPY DIAG:  Dizziness and giddiness  Unsteadiness on feet  ONSET DATE: 01/12/2023  Rationale for Evaluation and Treatment: Rehabilitation  SUBJECTIVE:   SUBJECTIVE STATEMENT: Patient reports doing well. Does still have dizziness rolling R. Denies falls.  Pt accompanied by: self  PERTINENT HISTORY: anemia, arthritis, GERD, sarcoidosis  PAIN:  Are you having pain?  Does report "usual neck pain"  PRECAUTIONS: Fall   PATIENT GOALS: "to not be dizzy"  OBJECTIVE:   DIAGNOSTIC FINDINGS: 1. Normal MRI appearance of the brain. No acute or focal lesion to explain the patient's symptoms. 2. C3-4 disc protrusion with mild central canal stenosis. 3. Abnormal signal within the C3 vertebral body. This may be degenerative. Recommend MRI of the cervical spine without and with contrast for further evaluation. 4. Mild sphenoid sinus disease.  VESTIBULAR ASSESSMENT   POSITIONAL TESTING: Right Dix-Hallpike: upbeating, right nystagmus Left Dix-Hallpike: no nystagmus Right Roll Test: no nystagmus Left Roll Test: no nystagmus  MOTION SENSITIVITY:  Motion Sensitivity Quotient Intensity: 0 = none, 1 = Lightheaded, 2 = Mild, 3 = Moderate, 4 = Severe, 5 = Vomiting  Intensity  1. Sitting to supine 4  2. Supine to L side 3  3. Supine to R side 3  4. Supine to sitting 3  5. L Hallpike-Dix 0  6. Up from L  2  7. R Hallpike-Dix 4  8. Up from R  4  9.  Sitting, head tipped to L knee 0  10. Head up from L knee 2  11. Sitting, head tipped to R knee 0  12. Head up from R knee 2  13. Sitting head turns x5 0  14.Sitting head nods x5 0  15. In stance, 180 turn to L  0  16. In stance, 180 turn to R 1     VESTIBULAR TREATMENT:                                                                                                   R epley x4 with improvement of symptoms  Loaded R Epley x1 with improvement of symptoms  Austin Miles x2 each direction with no increase in symptoms  PATIENT EDUCATION: Education details: HEP Person educated: Patient Education method: Chief Technology Officer Education comprehension: verbalized understanding and needs further education  HOME EXERCISE PROGRAM: Self epley PRN Sit to Side-Lying    Sit on edge of bed. 1. Turn head 45 to right. 2. Maintain head position and lie down slowly on left side. Hold until symptoms subside. 3. Sit up slowly. Hold until symptoms subside. 4. Turn head 45 to left. 5. Maintain head position and lie down slowly on right side. Hold until symptoms subside. 6. Sit up slowly. Repeat sequence ___4_ times per session. Do __3__ sessions per day. Gaze Stabilization: Sitting    Keeping eyes on target on wall 5 feet away, tilt head down 15-30 and move head side to side for _30___ seconds. Repeat while moving head up and down for __30__ seconds. Do __3__ sessions per day. GOALS: Goals reviewed with patient? Yes  SHORT TERM GOALS: Target date: 03/17/23  Pt will be independent with initial HEP for improved symptoms Baseline: to be provided Goal status: INITIAL  2.  Patient will demonstrate (-) positional testing to indicate resolution of BPPV  Baseline: needs to be retested without meclizine Goal status: INITIAL  3.  FOTO goal Baseline: to be assessed Goal status: INITIAL  4. DVA goal  Baseline: not indicated  Goal status: INITIAL   LONG TERM GOALS: Target date:  04/14/23  Pt will be independent with initial HEP for improved symptom report Baseline: to be provided  Goal status: INITIAL  2.  Pt will report </= 2/5 for all movements on MSQ to indicate improvement in motion sensitivity and improved activity tolerance.   Baseline: up to 4/5  Goal status: INITIAL  3.  FOTO goal Baseline: to be assessed  Goal status: INITIAL  4. DVA goal   Baseline: not indicated  Goal status: INITIAL  ASSESSMENT:  CLINICAL IMPRESSION: Patient seen for skilled PT services with emphasis on vestibular retraining. She did take a meclizine last night and so nystagmus was muted, but patient reporting spinning dizziness with R dix hallpike again. Tx x4 with normal an loaded dix hallpike as well with improvement in symptoms. Tolerated brandt daroff well with no reproduction in symptoms or spinning dizziness. Continue POC.   OBJECTIVE IMPAIRMENTS: decreased balance, decreased knowledge of condition, and dizziness.   ACTIVITY LIMITATIONS: stairs, bed mobility, reach over head, locomotion level, and caring for others  PARTICIPATION LIMITATIONS: cleaning, interpersonal relationship, driving, shopping, and community activity  PERSONAL FACTORS: Age, Past/current experiences, Sex, Time since onset of injury/illness/exacerbation, Transportation, and 1-2 comorbidities: see above  are also affecting patient's functional outcome.   REHAB POTENTIAL: Good  CLINICAL DECISION MAKING: Evolving/moderate complexity  EVALUATION COMPLEXITY: Moderate   PLAN:  PT FREQUENCY: 2x/week  PT DURATION: 6 weeks  PLANNED INTERVENTIONS: Therapeutic exercises, Therapeutic activity, Neuromuscular re-education, Balance training, Gait training, Patient/Family education, Self Care, Joint mobilization, Stair training, Vestibular training, Canalith repositioning, Visual/preceptual remediation/compensation, Orthotic/Fit training, DME instructions, Aquatic Therapy, Manual therapy, and  Re-evaluation  PLAN FOR NEXT SESSION: reassess for BPPV PRN, habituation, VOR   Westley Foots, PT Westley Foots, PT, DPT, CBIS  02/28/2023, 11:01 AM

## 2023-03-07 ENCOUNTER — Encounter: Payer: Self-pay | Admitting: Physical Therapy

## 2023-03-07 ENCOUNTER — Ambulatory Visit: Payer: Medicare HMO | Admitting: Physical Therapy

## 2023-03-07 VITALS — BP 110/73 | HR 69

## 2023-03-07 DIAGNOSIS — R2681 Unsteadiness on feet: Secondary | ICD-10-CM

## 2023-03-07 DIAGNOSIS — R42 Dizziness and giddiness: Secondary | ICD-10-CM | POA: Diagnosis not present

## 2023-03-07 NOTE — Therapy (Signed)
OUTPATIENT PHYSICAL THERAPY VESTIBULAR TREATMENT     Patient Name: [name] MRN: [id number] DOB:[birth year], 57 y.o., female Today's Date: 03/07/2023  END OF SESSION:  PT End of Session - 03/07/23 0935     Visit Number 4    Number of Visits 13    Date for PT Re-Evaluation 04/14/23    Authorization Type human[employer] medicare    PT Start Time 0933    PT Stop Time 1010    PT Time Calculation (min) 37 min    Activity Tolerance Patient tolerated treatment well    Behavior During Therapy [facility] for tasks assessed/performed             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   Irritable bowel syndrome    Overweight(278.02)    Personal history of colonic polyps 09/19/2011   tubular adenoma   Sarcoidosis 03/20/2019   Segmental colitis (HCC)    Tubular adenoma of colon    Past Surgical History:  Procedure Laterality Date   COLONOSCOPY     FACET JOINT INJECTION  03/28/11   injections in neck   hysterectomy  2000   IR SACROPLASTY BILATERAL  06/18/2019   KNEE SURGERY Right     Right knee arthroscopic partial lateral meniscectomy (01/22/07),arthroscopic partial lateral meniscectomy 5/09   POLYPECTOMY     SPINE SURGERY  10/13/06   Posterior interbody fusion (PLIF) L5-S1 (07/04/07)   WRIST FRACTURE SURGERY Left 2011   Due to injury at work in 2008.   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   Pain in joint of left elbow 08/29/2017   Pain in joint of right elbow 08/29/2017   Pain of left shoulder joint on movement 08/29/2017   Hx of adenomatous colonic polyps 11/14/2014   Chronic pain syndrome 09/04/2013   Epigastric abdominal tenderness 08/16/2013   Elbow pain, right 06/30/2013   Fibromyalgia  04/19/2012   IBS (irritable bowel syndrome) 04/19/2012   Joint pain 04/19/2012   Benign neoplasm of colon 09/19/2011   Colitis, nonspecific 09/19/2011   Hematochezia 09/19/2011   Abdominal pain, other specified site 09/08/2011   GERD (gastroesophageal reflux disease) 09/08/2011   General medical examination 04/13/2011   Cervical disc disease 03/15/2011   Multiple thyroid nodules 02/14/2011    PCP: Laurann Montana, MD REFERRING PROVIDER: Laurann Montana, MD  REFERRING DIAG: H81.10 (ICD-10-CM) - Benign paroxysmal vertigo, unspecified ear  THERAPY DIAG:  Dizziness and giddiness  Unsteadiness on feet  ONSET DATE: 01/12/2023  Rationale for Evaluation and Treatment: Rehabilitation  SUBJECTIVE:   SUBJECTIVE STATEMENT: Haven't been able to work on her homework. Reports laying down in the bed is feeling mild and not as bad as it was. Uses a travel pillow around her neck to help her lay on her right side. Tried the [employer] once and did feel a little nauseous afterwards. Have not taken Meclizine in 2 days.   Pt accompanied by: self  PERTINENT HISTORY: anemia, arthritis, GERD, sarcoidosis  PAIN:  Are you having pain?  Does report "usual neck pain"  Vitals:   03/07/23 0940  BP: 110/73  Pulse: 69     PRECAUTIONS: Fall   PATIENT GOALS: "to not be dizzy"  OBJECTIVE:   DIAGNOSTIC FINDINGS: 1. Normal MRI appearance of the brain. No acute or focal lesion to explain the patient's symptoms. 2. C3-4 disc protrusion with mild central canal stenosis. 3. Abnormal signal within the C3 vertebral body. This may be degenerative. Recommend MRI of the cervical spine without and with contrast for further evaluation. 4. Mild sphenoid sinus disease.   VESTIBULAR ASSESSMENT   POSITIONAL TESTING: Right Dix-Hallpike: no nystagmus and with pt reporting spinning during the 1st assessment, during 2nd assessment pt still with no nystagmus and no reports of spinning   Right Sidelying: no  nystagmus Left Sidelying: no nystagmus  Pt with dizziness with return to upright from R and L sidelying positions    VESTIBULAR TREATMENT:                                                                                                    Francee Piccolo daroff x3 reps each direction with mild symptoms with return to upright, with decr symptoms after each rep   Gaze Adaptation: x1 Viewing Horizontal: Position: Seated, Time: 30 seconds, Reps: 3, and Comment: Mild dizziness, cues for continuous head motion, 4/10 dizziness  x1 Viewing Vertical:  Position: Seated, Time: 30 seconds, Reps: 3, and Comment: Mild dizziness    PATIENT EDUCATION: Education details: discussed resolution of BPPV at this time, working on [employer] exercises for habituation at home, reviewed VOR exercises  Person educated: Patient Education method: Programmer, multimedia, Facilities manager, Verbal cues, and Handouts Education comprehension: verbalized understanding, returned demonstration, and needs further education  HOME EXERCISE PROGRAM: Self epley PRN Sit to Side-Lying    Sit on edge of bed. 1. Turn head 45 to right. 2. Maintain head position and lie down slowly on left side. Hold until symptoms subside. 3. Sit up slowly. Hold until symptoms subside. 4. Turn head 45 to left. 5. Maintain head position and lie down slowly on right side. Hold until symptoms subside. 6. Sit up slowly. Repeat sequence ___4_ times per session. Do __3__ sessions per day. Gaze Stabilization: Sitting    Keeping eyes on target on wall 5 feet away, tilt head down 15-30 and move head side to side for _30___ seconds. Repeat while moving head up and down for __30__ seconds. Do __3__ sessions per day. GOALS: Goals reviewed with patient? Yes  SHORT TERM GOALS: Target date: 03/17/23  Pt will be independent with initial HEP for improved symptoms Baseline: to be provided Goal status: INITIAL  2.  Patient will demonstrate (-) positional testing to  indicate resolution of BPPV  Baseline: needs to be retested without meclizine Goal status: INITIAL  3.  FOTO goal Baseline: to be assessed Goal status: INITIAL  4. DVA goal  Baseline: not indicated  Goal status: INITIAL   LONG TERM GOALS: Target date: 04/14/23  Pt will be independent with initial HEP for improved symptom report Baseline: to be provided  Goal status: INITIAL  2.  Pt will report </= 2/5 for all movements on MSQ to indicate improvement in motion sensitivity and improved activity tolerance.   Baseline: up to 4/5  Goal status: INITIAL  3.  FOTO goal Baseline: to be assessed  Goal status: INITIAL  4. DVA goal   Baseline: not indicated  Goal status: INITIAL  ASSESSMENT:  CLINICAL IMPRESSION: Re-assessed positional testing today with pt negative for BPPV. Pt just with residual motion sensitivity with return to upright. Performed Austin Miles x3 reps with pt reporting improvement in symptoms. Educated to perform daily at home. Remainder of session focused on reviewing seated VOR x1 exercises, with pt with less symptoms in the vertical direction compared to horizontal.  Continue POC.   OBJECTIVE IMPAIRMENTS: decreased balance, decreased knowledge of condition, and dizziness.   ACTIVITY LIMITATIONS: stairs, bed mobility, reach over head, locomotion level, and caring for others  PARTICIPATION LIMITATIONS: cleaning, interpersonal relationship, driving, shopping, and community activity  PERSONAL FACTORS: Age, Past/current experiences, Sex, Time since onset of injury/illness/exacerbation, Transportation, and 1-2 comorbidities: see above  are also affecting patient's functional outcome.   REHAB POTENTIAL: Good  CLINICAL DECISION MAKING: Evolving/moderate complexity  EVALUATION COMPLEXITY: Moderate   PLAN:  PT FREQUENCY: 2x/week  PT DURATION: 6 weeks  PLANNED INTERVENTIONS: Therapeutic exercises, Therapeutic activity, Neuromuscular re-education, Balance  training, Gait training, Patient/Family education, Self Care, Joint mobilization, Stair training, Vestibular training, Canalith repositioning, Visual/preceptual remediation/compensation, Orthotic/Fit training, DME instructions, Aquatic Therapy, Manual therapy, and Re-evaluation  PLAN FOR NEXT SESSION: reassess for BPPV PRN, habituation, VOR, assess DVA and mCTSIB    Lesbia Ottaway, PT, DPT 03/07/23 10:14 AM

## 2023-03-07 NOTE — Patient Instructions (Signed)
Gaze Stabilization: Sitting    Keeping eyes on target on wall a few feet away, tilt head down 15-30 and move head side to side for __30__ seconds. Repeat while moving head up and down for _30___ seconds.  Perform 3 reps of each.  Do __2-3__ sessions per day.   Gaze Stabilization: Tip Card  1.Target must remain in focus, not blurry, and appear stationary while head is in motion. 2.Perform exercises with small head movements (45 to either side of midline). 3.Increase speed of head motion so long as target is in focus. 4.If you wear eyeglasses, be sure you can see target through lens (therapist will give specific instructions for bifocal / progressive lenses). 5.These exercises may provoke dizziness or nausea. Work through these symptoms. If too dizzy, slow head movement slightly. Rest between each exercise. 6.Exercises demand concentration; avoid distractions. 7.For safety, perform standing exercises close to a counter, wall, corner, or next to someone.    Copyright  V[employer]. All rights reserved.

## 2023-03-09 ENCOUNTER — Ambulatory Visit: Payer: Medicare HMO | Admitting: Physical Therapy

## 2023-03-09 ENCOUNTER — Encounter: Payer: Self-pay | Admitting: Physical Therapy

## 2023-03-09 DIAGNOSIS — R42 Dizziness and giddiness: Secondary | ICD-10-CM | POA: Diagnosis not present

## 2023-03-09 DIAGNOSIS — R2681 Unsteadiness on feet: Secondary | ICD-10-CM

## 2023-03-09 NOTE — Therapy (Signed)
OUTPATIENT PHYSICAL THERAPY VESTIBULAR TREATMENT     Patient Name: [name] MRN: [id number] DOB:1965/08/02, 57 y.o., female Today's Date: 03/09/2023  END OF SESSION:  PT End of Session - 03/09/23 1019     Visit Number 5    Number of Visits 13    Date for PT Re-Evaluation 04/14/23    Authorization Type [employer] medic[employer]    PT Start Time 1017    PT Stop Time 1050   full time not used due to BPPV   PT Time Calculation (min) 33 min    Activity Tolerance Other (comment);Patient tolerated treatment well   limited by nausea   Behavior During Therapy [facility] for tasks assessed/performed             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   Irritable bowel syndrome    Overweight(278.02)    Personal history of colonic polyps 09/19/2011   tubular adenoma   Sarcoidosis 03/20/2019   Segmental colitis (HCC)    Tubular adenoma of colon    Past Surgical History:  Procedure Laterality Date   COLONOSCOPY     FACET JOINT INJECTION  03/28/11   injections in neck   hysterectomy  2000   IR SACROPLASTY BILATERAL  06/18/2019   KNEE SURGERY Right     Right knee arthroscopic partial lateral meniscectomy (01/22/07),arthroscopic partial lateral meniscectomy 5/09   POLYPECTOMY     SPINE SURGERY  10/13/06   Posterior interbody fusion (PLIF) L5-S1 (07/04/07)   WRIST FRACTURE SURGERY Left 2011   Due to injury at work in 2008.   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   Pain in joint of left elbow 08/29/2017   Pain in joint of right elbow 08/29/2017   Pain of left shoulder joint on movement 08/29/2017   Hx of adenomatous colonic polyps 11/14/2014   Chronic pain syndrome 09/04/2013   Epigastric abdominal tenderness  08/16/2013   Elbow pain, right 06/30/2013   Fibromyalgia 04/19/2012   IBS (irritable bowel syndrome) 04/19/2012   Joint pain 04/19/2012   Benign neoplasm of colon 09/19/2011   Colitis, nonspecific 09/19/2011   Hematochezia 09/19/2011   Abdominal pain, other specified site 09/08/2011   GERD (gastroesophageal reflux disease) 09/08/2011   General medical examination 04/13/2011   Cervical disc disease 03/15/2011   Multiple thyroid nodules 02/14/2011    PCP: Laurann Montana, MD REFERRING PROVIDER: Laurann Montana, MD  REFERRING DIAG: H81.10 (ICD-10-CM) - Benign paroxysmal vertigo, unspecified ear  THERAPY DIAG:  Dizziness and giddiness  Unsteadiness on feet  ONSET DATE: 01/12/2023  Rationale for Evaluation and Treatment: Rehabilitation  SUBJECTIVE:   SUBJECTIVE STATEMENT: Tried the [employer] exercises, felt better going down on her R side. Didn't sleep well last night. No vertigo/spinning dizziness. Tried the VOR exercise and reports it is challenging. Took a Meclizine last night.   Pt accompanied by: self  PERTINENT HISTORY: anemia, arthritis, GERD, sarcoidosis  PAIN:  Are you having pain?  Does report "usual back pain"  There were no vitals filed for this visit.    PRECAUTIONS: Fall   PATIENT GOALS: "to not be dizzy"  OBJECTIVE:   DIAGNOSTIC FINDINGS: 1. Normal MRI appearance of the brain. No  acute or focal lesion to explain the patient's symptoms. 2. C3-4 disc protrusion with mild central canal stenosis. 3. Abnormal signal within the C3 vertebral body. This may be degenerative. Recommend MRI of the cervical spine without and with contrast for further evaluation. 4. Mild sphenoid sinus disease.   VESTIBULAR ASSESSMENT   POSITIONAL TESTING: Left Dix-Hallpike: upbeating, left nystagmus and very mild, lasting approx. 5-8 seconds, pt needing to be sat up from testing position due to nausea  Right Roll Test: no nystagmus Left Roll Test: no nystagmus Right  Sidelying: no nystagmus Left Sidelying: no nystagmus and pt reporting spinning dizziness, lasted about a minute, unable to see nystagmus  After sitting up from L DixHallpike due to nausea, provided cold washcloth and ginger ale to patient, pt then feeling better and agreeable to do the Epley   VESTIBULAR TREATMENT:     Canalith Repositioning: Epley Left: Number of Reps: 1, Response to Treatment: symptoms resolved, and Comment: No nystagmus/dizziness noted in L DixHallpike or L sidelying after re-assessment   Performed [place] x1 rep each side at end of session, with pt having no spinning/dizziness in sidelying position, but just felt her stomach drop with return to upright.    PATIENT EDUCATION: Education details: Discussed pt with L posterior canal BPPV and resolved after 1 rep of Epley maneuver, educated to work on [employer] at home and to perform the Epley (pt already educated on how to do this for R BPPV) if spinning dizziness returns.  Educated on purpose of Meclizine and  should not have to take it if she is not feeling dizzy as it won't help with clearing pt's BPPV. Person educated: Patient Education method: Explanation, Demonstration, Verbal cues, and Handouts Education comprehension: verbalized understanding, returned demonstration, and needs further education  HOME EXERCISE PROGRAM: Self epley PRN Sit to Side-Lying    Sit on edge of bed. 1. Turn head 45 to right. 2. Maintain head position and lie down slowly on left side. Hold until symptoms subside. 3. Sit up slowly. Hold until symptoms subside. 4. Turn head 45 to left. 5. Maintain head position and lie down slowly on right side. Hold until symptoms subside. 6. Sit up slowly. Repeat sequence ___4_ times per session. Do __3__ sessions per day. Gaze Stabilization: Sitting    Keeping eyes on target on wall 5 feet away, tilt head down 15-30 and move head side to side for _30___ seconds. Repeat while moving  head up and down for __30__ seconds. Do __3__ sessions per day.  GOALS: Goals reviewed with patient? Yes  SHORT TERM GOALS: Target date: 03/17/23  Pt will be independent with initial HEP for improved symptoms Baseline: to be provided Goal status: INITIAL  2.  Patient will demonstrate (-) positional testing to indicate resolution of BPPV  Baseline: needs to be retested without meclizine Goal status: INITIAL  3.  FOTO goal Baseline: staff did not capture  Goal status: N/A  4. DVA goal  Baseline: not indicated  Goal status: INITIAL   LONG TERM GOALS: Target date: 04/14/23  Pt will be independent with initial HEP for improved symptom report Baseline: to be provided  Goal status: INITIAL  2.  Pt will report </= 2/5 for all movements on MSQ to indicate improvement in motion sensitivity and improved activity tolerance.   Baseline: up to 4/5  Goal status: INITIAL  3.  FOTO goal Baseline: to be assessed  Goal status: INITIAL  4. DVA goal   Baseline: not indicated  Goal status: INITIAL  ASSESSMENT:  CLINICAL IMPRESSION: With re-assessment of positional testing today, pt demonstrating L upbeating rotary nystagmus in L Dix-Hallpike position (very mild, likely due to pt taking Meclizine last night) and pt reporting spinning. Pt also with nausea and had to return upright to provide pt with cold wash cloth and ginger ale before performing Epley. Performed x1 rep of Epley with pt demonstrating resolution of L BPPV afterwards with no spinning. Educated pt to continue to perform [employer] exercises at home and can perform the Epley as needed if spinning returns. Pt verbalized understanding. Continue POC.   OBJECTIVE IMPAIRMENTS: decreased balance, decreased knowledge of condition, and dizziness.   ACTIVITY LIMITATIONS: stairs, bed mobility, reach over head, locomotion level, and caring for others  PARTICIPATION LIMITATIONS: cleaning, interpersonal relationship, driving,  shopping, and community activity  PERSONAL FACTORS: Age, Past/current experiences, Sex, Time since onset of injury/illness/exacerbation, Transportation, and 1-2 comorbidities: see above  are also affecting patient's functional outcome.   REHAB POTENTIAL: Good  CLINICAL DECISION MAKING: Evolving/moderate complexity  EVALUATION COMPLEXITY: Moderate   PLAN:  PT FREQUENCY: 2x/week  PT DURATION: 6 weeks  PLANNED INTERVENTIONS: Therapeutic exercises, Therapeutic activity, Neuromuscular re-education, Balance training, Gait training, Patient/Family education, Self Care, Joint mobilization, Stair training, Vestibular training, Canalith repositioning, Visual/preceptual remediation/compensation, Orthotic/Fit training, DME instructions, Aquatic Therapy, Manual therapy, and Re-evaluation  PLAN FOR NEXT SESSION: reassess for BPPV PRN, habituation, VOR, assess DVA and mCTSIB    Sherlie Ban, PT, DPT 03/09/23 10:58 AM

## 2023-03-14 ENCOUNTER — Ambulatory Visit: Payer: Medicare HMO | Admitting: Physical Therapy

## 2023-03-14 ENCOUNTER — Encounter: Payer: Self-pay | Admitting: Physical Therapy

## 2023-03-14 DIAGNOSIS — R2681 Unsteadiness on feet: Secondary | ICD-10-CM

## 2023-03-14 DIAGNOSIS — R42 Dizziness and giddiness: Secondary | ICD-10-CM

## 2023-03-14 NOTE — Therapy (Signed)
OUTPATIENT PHYSICAL THERAPY VESTIBULAR TREATMENT     Patient Name: [name] MRN: [id number] DOB:July 05, 1965, 57 y.o., female Today's Date: 03/14/2023  END OF SESSION:  PT End of Session - 03/14/23 0935     Visit Number 6    Number of Visits 13    Date for PT Re-Evaluation 04/14/23    Authorization Type human[employer] medicare    PT Start Time 0933    PT Stop Time 1013    PT Time Calculation (min) 40 min    Activity Tolerance Patient tolerated treatment well    Behavior During Therapy [facility] for tasks assessed/performed             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   Irritable bowel syndrome    Overweight(278.02)    Personal history of colonic polyps 09/19/2011   tubular adenoma   Sarcoidosis 03/20/2019   Segmental colitis (HCC)    Tubular adenoma of colon    Past Surgical History:  Procedure Laterality Date   COLONOSCOPY     FACET JOINT INJECTION  03/28/11   injections in neck   hysterectomy  2000   IR SACROPLASTY BILATERAL  06/18/2019   KNEE SURGERY Right     Right knee arthroscopic partial lateral meniscectomy (01/22/07),arthroscopic partial lateral meniscectomy 5/09   POLYPECTOMY     SPINE SURGERY  10/13/06   Posterior interbody fusion (PLIF) L5-S1 (07/04/07)   WRIST FRACTURE SURGERY Left 2011   Due to injury at work in 2008.   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   Pain in joint of left elbow 08/29/2017   Pain in joint of right elbow 08/29/2017   Pain of left shoulder joint on movement 08/29/2017   Hx of adenomatous colonic polyps 11/14/2014   Chronic pain syndrome 09/04/2013   Epigastric abdominal tenderness 08/16/2013   Elbow pain, right 06/30/2013   Fibromyalgia  04/19/2012   IBS (irritable bowel syndrome) 04/19/2012   Joint pain 04/19/2012   Benign neoplasm of colon 09/19/2011   Colitis, nonspecific 09/19/2011   Hematochezia 09/19/2011   Abdominal pain, other specified site 09/08/2011   GERD (gastroesophageal reflux disease) 09/08/2011   General medical examination 04/13/2011   Cervical disc disease 03/15/2011   Multiple thyroid nodules 02/14/2011    PCP: Laurann Montana, MD REFERRING PROVIDER: Laurann Montana, MD  REFERRING DIAG: H81.10 (ICD-10-CM) - Benign paroxysmal vertigo, unspecified ear  THERAPY DIAG:  Unsteadiness on feet  Dizziness and giddiness  ONSET DATE: 01/12/2023  Rationale for Evaluation and Treatment: Rehabilitation  SUBJECTIVE:   SUBJECTIVE STATEMENT: Spinning has gotten better compared to when she was last here. Had a relaxing weekend. Did have to take the Meclizine one time over the weekend on Saturday and Sunday. Notes sometimes will sensitive to bright lights.   Pt accompanied by: self  PERTINENT HISTORY: anemia, arthritis, GERD, sarcoidosis  PAIN:  Are you having pain?  Does report "usual back pain"  There were no vitals filed for this visit.    PRECAUTIONS: Fall   PATIENT GOALS: "to not be dizzy"  OBJECTIVE:   DIAGNOSTIC FINDINGS: 1. Normal MRI appearance of the brain. No acute or focal lesion to explain the patient's symptoms. 2. C3-4  disc protrusion with mild central canal stenosis. 3. Abnormal signal within the C3 vertebral body. This may be degenerative. Recommend MRI of the cervical spine without and with contrast for further evaluation. 4. Mild sphenoid sinus disease.   VESTIBULAR ASSESSMENT   POSITIONAL TESTING: Right Dix-Hallpike: no nystagmus Left Dix-Hallpike: no nystagmus  Pt felt a drop in her stomach with return to upright, but no dizziness.   DVA: Static: Line 10 Dynamic: Line 8 2 line difference, 5/10 dizziness     M-CTSIB  Condition 1: Firm Surface, EO 21 Sec, Normal  Sway  Condition 2: Firm Surface, EC 11 Sec, Mild Sway  Condition 3: Foam Surface, EO 14 Sec, Mild Sway  Condition 4: Foam Surface, EC 4.2 Sec      VESTIBULAR TREATMENT:     On level ground: with narrow BOS (slight space between feet) 3 x 30 seconds, intermittent taps to wall for balance On air ex: EO with feet hip width; 2 x 10 reps head turns, 2 x 10 reps head nods, more challenged with head nods, no dizziness just unsteadiness, cues to just move head  EC with wide BOS 3 x 30 seconds, intermittent finger taps to wall for balance, but improved with incr reps    PATIENT EDUCATION: Education details: Discussed pt with resolution of BPPV at this time, educated on results of DVA and mCTSIB, new additions to HEP for balance with EC, head motions, and compliant surfaces.  Person educated: Patient Education method: Explanation, Demonstration, Verbal cues, and Handouts Education comprehension: verbalized understanding, returned demonstration, and needs further education  HOME EXERCISE PROGRAM: Access Code: [email or web address] URL: [email or web address] Date: 03/14/2023 Prepared by: Sherlie Ban  Exercises - Wide Stance with Eyes Closed on Foam Pad  - 1 x daily - 7 x weekly - 3 sets - 30 hold - Standing on Foam Pad  - 1 x daily - 7 x weekly - 2 sets - 10 reps - Romberg Stance with Eyes Closed  - 1 x daily - 7 x weekly - 3 sets - 30 hold  Self epley PRN Sit to Side-Lying    Sit on edge of bed. 1. Turn head 45 to right. 2. Maintain head position and lie down slowly on left side. Hold until symptoms subside. 3. Sit up slowly. Hold until symptoms subside. 4. Turn head 45 to left. 5. Maintain head position and lie down slowly on right side. Hold until symptoms subside. 6. Sit up slowly. Repeat sequence ___4_ times per session. Do __3__ sessions per day. Gaze Stabilization: Sitting    Keeping eyes on target on wall 5 feet away, tilt head down 15-30 and move head side to side for  _30___ seconds. Repeat while moving head up and down for __30__ seconds. Do __3__ sessions per day.  GOALS: Goals reviewed with patient? Yes  SHORT TERM GOALS: Target date: 03/17/23  Pt will be independent with initial HEP for improved symptoms Baseline: to be provided Goal status: INITIAL  2.  Patient will demonstrate (-) positional testing to indicate resolution of BPPV  Baseline: cleared on 9/17 Goal status: MET  3.  FOTO goal Baseline: staff did not capture  Goal status: N/A  4. DVA goal  Baseline: 2 line difference, 5/10 dizziness   Goal status: MET   LONG TERM GOALS: Target date: 04/14/23  Pt will be independent with initial HEP for improved symptom report Baseline: to be provided  Goal status: INITIAL  2.  Pt will report </= 2/5 for all movements on  MSQ to indicate improvement in motion sensitivity and improved activity tolerance.   Baseline: up to 4/5  Goal status: INITIAL  3.  FOTO goal Baseline: staff did not capture  Goal status: N/A  4. Pt will perform DVA with 1 line difference with 3/10 dizziness or less in order to demo improved VOR.   Baseline: 2 line difference, 5/10 dizziness   Goal status: INITIAL  ASSESSMENT:  CLINICAL IMPRESSION: Re-assessed positional testing today with pt negative for BPPV. Pt still reporting a drop in her stomach when coming to sit upright. Educated to continue working on [employer] at home for habituation to upright. Assessed DVA with pt WNL with 2 line difference, but pt reporting 5/10 dizziness afterwards. Also assessed mCTSIB, with pt unable to hold any condition for 30 seconds and pt with significant decr vestibular input for balance and primarily relies on vision. Remainder of session focused on adding balance to HEP. Pt tolerated well, no dizziness, just feeling unsteady. Continue POC.   OBJECTIVE IMPAIRMENTS: decreased balance, decreased knowledge of condition, and dizziness.   ACTIVITY LIMITATIONS:  stairs, bed mobility, reach over head, locomotion level, and caring for others  PARTICIPATION LIMITATIONS: cleaning, interpersonal relationship, driving, shopping, and community activity  PERSONAL FACTORS: Age, Past/current experiences, Sex, Time since onset of injury/illness/exacerbation, Transportation, and 1-2 comorbidities: see above  are also affecting patient's functional outcome.   REHAB POTENTIAL: Good  CLINICAL DECISION MAKING: Evolving/moderate complexity  EVALUATION COMPLEXITY: Moderate   PLAN:  PT FREQUENCY: 2x/week  PT DURATION: 6 weeks  PLANNED INTERVENTIONS: Therapeutic exercises, Therapeutic activity, Neuromuscular re-education, Balance training, Gait training, Patient/Family education, Self Care, Joint mobilization, Stair training, Vestibular training, Canalith repositioning, Visual/preceptual remediation/compensation, Orthotic/Fit training, DME instructions, Aquatic Therapy, Manual therapy, and Re-evaluation  PLAN FOR NEXT SESSION: reassess for BPPV PRN, progress VOR exercises, progress vestibular input for balance - EC, head motions, unlevel surfaces    Suzann Lazaro, PT, DPT 03/14/23 10:17 AM

## 2023-03-16 ENCOUNTER — Telehealth: Payer: Self-pay

## 2023-03-16 ENCOUNTER — Ambulatory Visit: Payer: Medicare HMO

## 2023-03-16 DIAGNOSIS — R42 Dizziness and giddiness: Secondary | ICD-10-CM | POA: Diagnosis not present

## 2023-03-16 DIAGNOSIS — R2681 Unsteadiness on feet: Secondary | ICD-10-CM

## 2023-03-16 NOTE — Telephone Encounter (Signed)
FAXED ON: 03/16/23  Laurann Montana, MD,  I'm currently seeing [name] DOB: 08/04/1965 for dizziness. She did have BPPV, which has since been cleared, but remains with significant balance challenges. She doesn't have any known vestibular causes for her imbalance. We did a full vestibular eval and she does not have a hypofunction or other explanatory causes. I was wondering if perhaps she was in a flair of her sarcoidosis and if that was somehow contributing. We would recommend following up with you or whoever is following her for the sarcoidosis for further management.   Thank you for your time,   Merry Lofty, PT, DPT, CBIS

## 2023-03-16 NOTE — Therapy (Signed)
OUTPATIENT PHYSICAL THERAPY VESTIBULAR TREATMENT     Patient Name: [name] MRN: [id number] DOB:1965-09-19, 57 y.o., female Today's Date: 03/16/2023  END OF SESSION:  PT End of Session - 03/16/23 1021     Visit Number 7    Number of Visits 13    Date for PT Re-Evaluation 04/14/23    Authorization Type [employer]    PT Start Time 1019   patient late   PT Stop Time 1058    PT Time Calculation (min) 39 min    Equipment Utilized During Treatment Gait belt    Activity Tolerance Patient tolerated treatment well    Behavior During Therapy WFL for tasks assessed/performed             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   Irritable bowel syndrome    Overweight(278.02)    Personal history of colonic polyps 09/19/2011   tubular adenoma   Sarcoidosis 03/20/2019   Segmental colitis (HCC)    Tubular adenoma of colon    Past Surgical History:  Procedure Laterality Date   COLONOSCOPY     FACET JOINT INJECTION  03/28/11   injections in neck   hysterectomy  2000   IR SACROPLASTY BILATERAL  06/18/2019   KNEE SURGERY Right     Right knee arthroscopic partial lateral meniscectomy (01/22/07),arthroscopic partial lateral meniscectomy 5/09   POLYPECTOMY     SPINE SURGERY  10/13/06   Posterior interbody fusion (PLIF) L5-S1 (07/04/07)   WRIST FRACTURE SURGERY Left 2011   Due to injury at work in 2008.   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   Pain in joint of left elbow 08/29/2017   Pain in joint of right elbow 08/29/2017   Pain of left shoulder joint on movement 08/29/2017   Hx of adenomatous colonic polyps 11/14/2014   Chronic pain syndrome 09/04/2013   Epigastric abdominal tenderness  08/16/2013   Elbow pain, right 06/30/2013   Fibromyalgia 04/19/2012   IBS (irritable bowel syndrome) 04/19/2012   Joint pain 04/19/2012   Benign neoplasm of colon 09/19/2011   Colitis, nonspecific 09/19/2011   Hematochezia 09/19/2011   Abdominal pain, other specified site 09/08/2011   GERD (gastroesophageal reflux disease) 09/08/2011   General medical examination 04/13/2011   Cervical disc disease 03/15/2011   Multiple thyroid nodules 02/14/2011    PCP: Laurann Montana, MD REFERRING PROVIDER: Laurann Montana, MD  REFERRING DIAG: H81.10 (ICD-10-CM) - Benign paroxysmal vertigo, unspecified ear  THERAPY DIAG:  Unsteadiness on feet  Dizziness and giddiness  ONSET DATE: 01/12/2023  Rationale for Evaluation and Treatment: Rehabilitation  SUBJECTIVE:   SUBJECTIVE STATEMENT: Spinning has gotten better compared to when she was last here. Had a relaxing weekend. Did have to take the Meclizine one time over the weekend on Saturday and Sunday. Notes sometimes will sensitive to bright lights.   Pt accompanied by: self  PERTINENT HISTORY: anemia, arthritis, GERD, sarcoidosis  PAIN:  Are you having pain?  Does report "usual back pain"  There were no vitals filed for this visit.    PRECAUTIONS: Fall   PATIENT GOALS: "to not be dizzy"  OBJECTIVE:   DIAGNOSTIC FINDINGS: 1. Normal MRI appearance of the brain.  No acute or focal lesion to explain the patient's symptoms. 2. C3-4 disc protrusion with mild central canal stenosis. 3. Abnormal signal within the C3 vertebral body. This may be degenerative. Recommend MRI of the cervical spine without and with contrast for further evaluation. 4. Mild sphenoid sinus disease.   VESTIBULAR ASSESSMENT   POSITIONAL TESTING: Right Dix-Hallpike: no nystagmus Left Dix-Hallpike: no nystagmus Right Roll Test: no nystagmus Left Roll Test: no nystagmus    VESTIBULAR TREATMENT:    Standing Balance: Surface: Floor Position: Narrow Base of  Support Feet Hip Width Apart Completed with: Eyes Open and Eyes Closed;  Tandem Stance:  Surface: Floor Completed with: Eyes Open and Eyes Closed;   Time: only tolerated ~5-10s   -increased instability with L foot anterior     PATIENT EDUCATION: Education details: added to HEP, components of sarcoidosis impacting balance/dizziness? Person educated: Patient Education method: Explanation, Demonstration, Verbal cues, and Handouts Education comprehension: verbalized understanding, returned demonstration, and needs further education  HOME EXERCISE PROGRAM: Access Code: [email or web address] URL: [email or web address] Date: 03/14/2023 Prepared by: Sherlie Ban  Exercises - Wide Stance with Eyes Closed on Foam Pad  - 1 x daily - 7 x weekly - 3 sets - 30 hold - Standing on Foam Pad  - 1 x daily - 7 x weekly - 2 sets - 10 reps - Romberg Stance with Eyes Closed  - 1 x daily - 7 x weekly - 3 sets - 30 hold - Standing Tandem Balance with Counter Support  - 1 x daily - 7 x weekly - 3 sets - 30 hold  Self epley PRN Sit to Side-Lying    Sit on edge of bed. 1. Turn head 45 to right. 2. Maintain head position and lie down slowly on left side. Hold until symptoms subside. 3. Sit up slowly. Hold until symptoms subside. 4. Turn head 45 to left. 5. Maintain head position and lie down slowly on right side. Hold until symptoms subside. 6. Sit up slowly. Repeat sequence ___4_ times per session. Do __3__ sessions per day. Gaze Stabilization: Sitting    Keeping eyes on target on wall 5 feet away, tilt head down 15-30 and move head side to side for _30___ seconds. Repeat while moving head up and down for __30__ seconds. Do __3__ sessions per day.  GOALS: Goals reviewed with patient? Yes  SHORT TERM GOALS: Target date: 03/17/23  Pt will be independent with initial HEP for improved symptoms Baseline: to be provided Goal status: INITIAL  2.  Patient will demonstrate (-) positional testing  to indicate resolution of BPPV  Baseline: cleared on 9/17 Goal status: MET  3.  FOTO goal Baseline: staff did not capture  Goal status: N/A  4. DVA goal  Baseline: 2 line difference, 5/10 dizziness   Goal status: MET   LONG TERM GOALS: Target date: 04/14/23  Pt will be independent with initial HEP for improved symptom report Baseline: to be provided  Goal status: INITIAL  2.  Pt will report </= 2/5 for all movements on MSQ to indicate improvement in motion sensitivity and improved activity tolerance.   Baseline: up to 4/5  Goal status: INITIAL  3.  FOTO goal Baseline: staff did not capture  Goal status: N/A  4. Pt will perform DVA with 1 line difference with 3/10 dizziness or less in order to demo improved VOR.   Baseline: 2 line difference, 5/10 dizziness   Goal status: INITIAL  ASSESSMENT:  CLINICAL IMPRESSION: Patient seen for skilled PT session  with emphasis on vestibular retraining. BPPV remains cleared. Patient with no true known vestibular cause for her significant imbalance. She does have a known diagnosis of sarcoidosis and PT questioning if a flair of sarcoidosis is somehow contributing to her imbalance. She also reports random and sudden onset of numbness in distal B LE and distal hands. Provided patient with semi-tandem exercise to progress balance challenges. Continue POC.   OBJECTIVE IMPAIRMENTS: decreased balance, decreased knowledge of condition, and dizziness.   ACTIVITY LIMITATIONS: stairs, bed mobility, reach over head, locomotion level, and caring for others  PARTICIPATION LIMITATIONS: cleaning, interpersonal relationship, driving, shopping, and community activity  PERSONAL FACTORS: Age, Past/current experiences, Sex, Time since onset of injury/illness/exacerbation, Transportation, and 1-2 comorbidities: see above  are also affecting patient's functional outcome.   REHAB POTENTIAL: Good  CLINICAL DECISION MAKING: Evolving/moderate  complexity  EVALUATION COMPLEXITY: Moderate   PLAN:  PT FREQUENCY: 2x/week  PT DURATION: 6 weeks  PLANNED INTERVENTIONS: Therapeutic exercises, Therapeutic activity, Neuromuscular re-education, Balance training, Gait training, Patient/Family education, Self Care, Joint mobilization, Stair training, Vestibular training, Canalith repositioning, Visual/preceptual remediation/compensation, Orthotic/Fit training, DME instructions, Aquatic Therapy, Manual therapy, and Re-evaluation  PLAN FOR NEXT SESSION: reassess for BPPV PRN, progress VOR exercises, progress vestibular input for balance - EC, head motions, unlevel surfaces    Westley Foots, PT, DPT, CBIS 03/16/23 11:57 AM

## 2023-03-21 ENCOUNTER — Encounter: Payer: Self-pay | Admitting: Physical Therapy

## 2023-03-21 ENCOUNTER — Ambulatory Visit: Payer: Medicare HMO | Admitting: Physical Therapy

## 2023-03-21 DIAGNOSIS — R42 Dizziness and giddiness: Secondary | ICD-10-CM

## 2023-03-21 DIAGNOSIS — R2681 Unsteadiness on feet: Secondary | ICD-10-CM | POA: Diagnosis not present

## 2023-03-21 NOTE — Therapy (Signed)
OUTPATIENT PHYSICAL THERAPY VESTIBULAR TREATMENT     Patient Name: [name] MRN: [id number] DOB:16-May-1966, 57 y.o., female Today's Date: 03/21/2023  END OF SESSION:  PT End of Session - 03/21/23 1020     Visit Number 8    Number of Visits 13    Date for PT Re-Evaluation 04/14/23    Authorization Type [employer]    PT Start Time 1019    PT Stop Time 1059    PT Time Calculation (min) 40 min    Equipment Utilized During Treatment Gait belt    Activity Tolerance Patient tolerated treatment well    Behavior During Therapy WFL for tasks assessed/performed             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   Irritable bowel syndrome    Overweight(278.02)    Personal history of colonic polyps 09/19/2011   tubular adenoma   Sarcoidosis 03/20/2019   Segmental colitis (HCC)    Tubular adenoma of colon    Past Surgical History:  Procedure Laterality Date   COLONOSCOPY     FACET JOINT INJECTION  03/28/11   injections in neck   hysterectomy  2000   IR SACROPLASTY BILATERAL  06/18/2019   KNEE SURGERY Right     Right knee arthroscopic partial lateral meniscectomy (01/22/07),arthroscopic partial lateral meniscectomy 5/09   POLYPECTOMY     SPINE SURGERY  10/13/06   Posterior interbody fusion (PLIF) L5-S1 (07/04/07)   WRIST FRACTURE SURGERY Left 2011   Due to injury at work in 2008.   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   Pain in joint of left elbow 08/29/2017   Pain in joint of right elbow 08/29/2017   Pain of left shoulder joint on movement 08/29/2017   Hx of adenomatous colonic polyps 11/14/2014   Chronic pain syndrome 09/04/2013   Epigastric abdominal tenderness 08/16/2013    Elbow pain, right 06/30/2013   Fibromyalgia 04/19/2012   IBS (irritable bowel syndrome) 04/19/2012   Joint pain 04/19/2012   Benign neoplasm of colon 09/19/2011   Colitis, nonspecific 09/19/2011   Hematochezia 09/19/2011   Abdominal pain, other specified site 09/08/2011   GERD (gastroesophageal reflux disease) 09/08/2011   General medical examination 04/13/2011   Cervical disc disease 03/15/2011   Multiple thyroid nodules 02/14/2011    PCP: Laurann Montana, MD REFERRING PROVIDER: Laurann Montana, MD  REFERRING DIAG: H81.10 (ICD-10-CM) - Benign paroxysmal vertigo, unspecified ear  THERAPY DIAG:  Unsteadiness on feet  Dizziness and giddiness  ONSET DATE: 01/12/2023  Rationale for Evaluation and Treatment: Rehabilitation  SUBJECTIVE:   SUBJECTIVE STATEMENT: Went to a class reunion this previous weekend in [place]. Took her Meclizine 1 time, just when she was feeling dizzy when she got up. Not getting anything from the vertigo when she gets up. Just still having a feeling when she is laying down and sitting back up, will feel a little nauseous feeling. This only happens sometimes. Has been working on some balance things, sometimes will feel a little wobbly. Planning on reaching out to her PCP regarding significant imbalance that PT is unsure of the cause.   Pt accompanied by: self  PERTINENT HISTORY: anemia, arthritis, GERD,  sarcoidosis  PAIN:  Are you having pain? No  There were no vitals filed for this visit.  PRECAUTIONS: Fall   PATIENT GOALS: "to not be dizzy"  OBJECTIVE:   DIAGNOSTIC FINDINGS: 1. Normal MRI appearance of the brain. No acute or focal lesion to explain the patient's symptoms. 2. C3-4 disc protrusion with mild central canal stenosis. 3. Abnormal signal within the C3 vertebral body. This may be degenerative. Recommend MRI of the cervical spine without and with contrast for further evaluation. 4. Mild sphenoid sinus disease.  VESTIBULAR TREATMENT:     2  x 5 reps sit <> stands on level ground with EC, pt with mild postural sway, feeling a little woozy, performed an additional 2 x 5 reps with wide BOS on air ex, trying to perform without UE support, mild postural sway when standing  Tandem gait down and back at countertop x2 reps, pt with intermittent UE support, pt fatigued easily with this and needing a seated rest break afterwards   Forward gait with head turns 4 x 20' - pt needing min A/CGA for balance, improving with incr reps and when pt spotted a target on the wall when turning her head, and head nods 4 x 20' CGA for balance, pt less unsteady with head nods compared to turns   On air ex: Wide BOS: EC 3 x 30 seconds, intermittent taps to wall for balance Standing marching with EO 2 x 10 reps, pt unsteady but able to perform with no UE support  On level ground: More narrow BOS EC: 10 reps head turns, 10 reps head nods, pt more challenged with head nods and pt feeling nauseous afterwards.    PATIENT EDUCATION: Education details: pt to reach out to her PCP regarding continued imbalance that PT is unsure of a cause for, added gait with head turns with UE support at wall/countertop for balance Person educated: Patient Education method: Explanation, Demonstration, Verbal cues, and Handouts Education comprehension: verbalized understanding, returned demonstration, and needs further education  HOME EXERCISE PROGRAM: Access Code: [email or web address] URL: [email or web address] Date: 03/21/2023 Prepared by: Sherlie Ban  Exercises - Wide Stance with Eyes Closed on Foam Pad  - 1 x daily - 7 x weekly - 3 sets - 30 hold - Standing on Foam Pad  - 1 x daily - 7 x weekly - 2 sets - 10 reps - Romberg Stance with Eyes Closed  - 1 x daily - 7 x weekly - 3 sets - 30 hold - Standing Tandem Balance with Counter Support  - 1 x daily - 7 x weekly - 3 sets - 30 hold - Walking with Head Rotation  - 1 x daily - 7 x weekly - 3 sets  Self epley  PRN Sit to Side-Lying    Sit on edge of bed. 1. Turn head 45 to right. 2. Maintain head position and lie down slowly on left side. Hold until symptoms subside. 3. Sit up slowly. Hold until symptoms subside. 4. Turn head 45 to left. 5. Maintain head position and lie down slowly on right side. Hold until symptoms subside. 6. Sit up slowly. Repeat sequence ___4_ times per session. Do __3__ sessions per day. Gaze Stabilization: Sitting    Keeping eyes on target on wall 5 feet away, tilt head down 15-30 and move head side to side for _30___ seconds. Repeat while moving head up and down for __30__ seconds. Do __3__ sessions per day.  GOALS: Goals reviewed with patient? Yes  SHORT TERM GOALS:  Target date: 03/17/23  Pt will be independent with initial HEP for improved symptoms Baseline: to be provided Goal status: INITIAL  2.  Patient will demonstrate (-) positional testing to indicate resolution of BPPV  Baseline: cleared on 9/17 Goal status: MET  3.  FOTO goal Baseline: staff did not capture  Goal status: N/A  4. DVA goal  Baseline: 2 line difference, 5/10 dizziness   Goal status: MET   LONG TERM GOALS: Target date: 04/14/23  Pt will be independent with initial HEP for improved symptom report Baseline: to be provided  Goal status: INITIAL  2.  Pt will report </= 2/5 for all movements on MSQ to indicate improvement in motion sensitivity and improved activity tolerance.   Baseline: up to 4/5  Goal status: INITIAL  3.  FOTO goal Baseline: staff did not capture  Goal status: N/A  4. Pt will perform DVA with 1 line difference with 3/10 dizziness or less in order to demo improved VOR.   Baseline: 2 line difference, 5/10 dizziness   Goal status: INITIAL  ASSESSMENT:  CLINICAL IMPRESSION: Today's skilled session continued on working on balance and vestibular retraining. BPPV continues to be cleared. Pt with most challenge with her balance with gait with head turns,  initially needing min A for balance, did improve with incr practice. With balance with head motions with EC on level ground, pt did get some nausea with head nods, but able to continue session after brief standing rest break. Will continue per POC.    OBJECTIVE IMPAIRMENTS: decreased balance, decreased knowledge of condition, and dizziness.   ACTIVITY LIMITATIONS: stairs, bed mobility, reach over head, locomotion level, and caring for others  PARTICIPATION LIMITATIONS: cleaning, interpersonal relationship, driving, shopping, and community activity  PERSONAL FACTORS: Age, Past/current experiences, Sex, Time since onset of injury/illness/exacerbation, Transportation, and 1-2 comorbidities: see above  are also affecting patient's functional outcome.   REHAB POTENTIAL: Good  CLINICAL DECISION MAKING: Evolving/moderate complexity  EVALUATION COMPLEXITY: Moderate   PLAN:  PT FREQUENCY: 2x/week  PT DURATION: 6 weeks  PLANNED INTERVENTIONS: Therapeutic exercises, Therapeutic activity, Neuromuscular re-education, Balance training, Gait training, Patient/Family education, Self Care, Joint mobilization, Stair training, Vestibular training, Canalith repositioning, Visual/preceptual remediation/compensation, Orthotic/Fit training, DME instructions, Aquatic Therapy, Manual therapy, and Re-evaluation  PLAN FOR NEXT SESSION: reassess for BPPV PRN, progress VOR exercises, progress vestibular input for balance - EC, head motions, unlevel surfaces    Peggy Loge, PT, DPT 03/21/23 11:11 AM

## 2023-03-22 ENCOUNTER — Other Ambulatory Visit: Payer: Self-pay | Admitting: Physical Medicine and Rehabilitation

## 2023-03-22 DIAGNOSIS — R531 Weakness: Secondary | ICD-10-CM

## 2023-03-22 DIAGNOSIS — M542 Cervicalgia: Secondary | ICD-10-CM

## 2023-03-23 ENCOUNTER — Ambulatory Visit: Payer: Medicare HMO

## 2023-03-23 DIAGNOSIS — R2681 Unsteadiness on feet: Secondary | ICD-10-CM | POA: Diagnosis not present

## 2023-03-23 DIAGNOSIS — R42 Dizziness and giddiness: Secondary | ICD-10-CM | POA: Diagnosis not present

## 2023-03-23 NOTE — Therapy (Signed)
OUTPATIENT PHYSICAL THERAPY VESTIBULAR TREATMENT     Patient Name: [name] MRN: [id number] DOB:1966/02/06, 57 y.o., female Today's Date: 03/23/2023  END OF SESSION:  PT End of Session - 03/23/23 1146     Visit Number 9    Number of Visits 13    Date for PT Re-Evaluation 04/14/23    Authorization Type [employer]    PT Start Time 1145    PT Stop Time 1227    PT Time Calculation (min) 42 min    Equipment Utilized During Treatment Gait belt    Activity Tolerance Patient tolerated treatment well    Behavior During Therapy WFL for tasks assessed/performed             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   Irritable bowel syndrome    Overweight(278.02)    Personal history of colonic polyps 09/19/2011   tubular adenoma   Sarcoidosis 03/20/2019   Segmental colitis (HCC)    Tubular adenoma of colon    Past Surgical History:  Procedure Laterality Date   COLONOSCOPY     FACET JOINT INJECTION  03/28/11   injections in neck   hysterectomy  2000   IR SACROPLASTY BILATERAL  06/18/2019   KNEE SURGERY Right     Right knee arthroscopic partial lateral meniscectomy (01/22/07),arthroscopic partial lateral meniscectomy 5/09   POLYPECTOMY     SPINE SURGERY  10/13/06   Posterior interbody fusion (PLIF) L5-S1 (07/04/07)   WRIST FRACTURE SURGERY Left 2011   Due to injury at work in 2008.   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   Pain in joint of left elbow 08/29/2017   Pain in joint of right elbow 08/29/2017   Pain of left shoulder joint on movement 08/29/2017   Hx of adenomatous colonic polyps 11/14/2014   Chronic pain syndrome 09/04/2013   Epigastric abdominal tenderness 08/16/2013    Elbow pain, right 06/30/2013   Fibromyalgia 04/19/2012   IBS (irritable bowel syndrome) 04/19/2012   Joint pain 04/19/2012   Benign neoplasm of colon 09/19/2011   Colitis, nonspecific 09/19/2011   Hematochezia 09/19/2011   Abdominal pain, other specified site 09/08/2011   GERD (gastroesophageal reflux disease) 09/08/2011   General medical examination 04/13/2011   Cervical disc disease 03/15/2011   Multiple thyroid nodules 02/14/2011    PCP: Laurann Montana, MD REFERRING PROVIDER: Laurann Montana, MD  REFERRING DIAG: H81.10 (ICD-10-CM) - Benign paroxysmal vertigo, unspecified ear  THERAPY DIAG:  Unsteadiness on feet  Dizziness and giddiness  ONSET DATE: 01/12/2023  Rationale for Evaluation and Treatment: Rehabilitation  SUBJECTIVE:   SUBJECTIVE STATEMENT: Patient reports doing fair. Does have random onset of nagging HA and attributes this to "moving quickly." Denies falls. Did call her dr and is waiting to hear back.   Pt accompanied by: self  PERTINENT HISTORY: anemia, arthritis, GERD, sarcoidosis  PAIN:  Are you having pain? No  There were no vitals filed for this visit.  PRECAUTIONS: Fall   PATIENT GOALS: "to not be dizzy"  OBJECTIVE:   DIAGNOSTIC FINDINGS: 1. Normal MRI appearance of the brain. No acute or focal lesion to explain the patient's symptoms. 2. C3-4 disc protrusion with mild central canal  stenosis. 3. Abnormal signal within the C3 vertebral body. This may be degenerative. Recommend MRI of the cervical spine without and with contrast for further evaluation. 4. Mild sphenoid sinus disease.  VESTIBULAR TREATMENT:    -in // bars:  -lateral stepping on foam balance beam blaze pods x4    -2x51mins   -increased use of ankle strategy, but required verbal cues to implement prior to just grabbing bars  -tandem gait fwd/backward on foam balance beam blaze pods x2   -1x2 mins  -rockerboard A/P (static -> smooth weight shifts)  -rockerboard lateral  (static-> smooth weight shifts)    PATIENT EDUCATION: Education details: continue HEP Person educated: Patient Education method: Programmer, multimedia, Demonstration, Verbal cues, and Handouts Education comprehension: verbalized understanding, returned demonstration, and needs further education  HOME EXERCISE PROGRAM: Access Code: [email or web address] URL: [email or web address] Date: 03/21/2023 Prepared by: Sherlie Ban  Exercises - Wide Stance with Eyes Closed on Foam Pad  - 1 x daily - 7 x weekly - 3 sets - 30 hold - Standing on Foam Pad  - 1 x daily - 7 x weekly - 2 sets - 10 reps - Romberg Stance with Eyes Closed  - 1 x daily - 7 x weekly - 3 sets - 30 hold - Standing Tandem Balance with Counter Support  - 1 x daily - 7 x weekly - 3 sets - 30 hold - Walking with Head Rotation  - 1 x daily - 7 x weekly - 3 sets  Self epley PRN Sit to Side-Lying    Sit on edge of bed. 1. Turn head 45 to right. 2. Maintain head position and lie down slowly on left side. Hold until symptoms subside. 3. Sit up slowly. Hold until symptoms subside. 4. Turn head 45 to left. 5. Maintain head position and lie down slowly on right side. Hold until symptoms subside. 6. Sit up slowly. Repeat sequence ___4_ times per session. Do __3__ sessions per day. Gaze Stabilization: Sitting    Keeping eyes on target on wall 5 feet away, tilt head down 15-30 and move head side to side for _30___ seconds. Repeat while moving head up and down for __30__ seconds. Do __3__ sessions per day.  GOALS: Goals reviewed with patient? Yes  SHORT TERM GOALS: Target date: 03/17/23  Pt will be independent with initial HEP for improved symptoms Baseline: to be provided Goal status: INITIAL  2.  Patient will demonstrate (-) positional testing to indicate resolution of BPPV  Baseline: cleared on 9/17 Goal status: MET  3.  FOTO goal Baseline: staff did not capture  Goal status: N/A  4. DVA goal  Baseline: 2 line  difference, 5/10 dizziness   Goal status: MET   LONG TERM GOALS: Target date: 04/14/23  Pt will be independent with initial HEP for improved symptom report Baseline: to be provided  Goal status: INITIAL  2.  Pt will report </= 2/5 for all movements on MSQ to indicate improvement in motion sensitivity and improved activity tolerance.   Baseline: up to 4/5  Goal status: INITIAL  3.  FOTO goal Baseline: staff did not capture  Goal status: N/A  4. Pt will perform DVA with 1 line difference with 3/10 dizziness or less in order to demo improved VOR.   Baseline: 2 line difference, 5/10 dizziness   Goal status: INITIAL  ASSESSMENT:  CLINICAL IMPRESSION: Patient seen for skilled PT session with emphasis on vestibular and balance retraining. Demonstrated appropriate use of ankle strategy, but when external source was  available (// bars), patient with preference to use that over internal strategies. Continue POC.    OBJECTIVE IMPAIRMENTS: decreased balance, decreased knowledge of condition, and dizziness.   ACTIVITY LIMITATIONS: stairs, bed mobility, reach over head, locomotion level, and caring for others  PARTICIPATION LIMITATIONS: cleaning, interpersonal relationship, driving, shopping, and community activity  PERSONAL FACTORS: Age, Past/current experiences, Sex, Time since onset of injury/illness/exacerbation, Transportation, and 1-2 comorbidities: see above  are also affecting patient's functional outcome.   REHAB POTENTIAL: Good  CLINICAL DECISION MAKING: Evolving/moderate complexity  EVALUATION COMPLEXITY: Moderate   PLAN:  PT FREQUENCY: 2x/week  PT DURATION: 6 weeks  PLANNED INTERVENTIONS: Therapeutic exercises, Therapeutic activity, Neuromuscular re-education, Balance training, Gait training, Patient/Family education, Self Care, Joint mobilization, Stair training, Vestibular training, Canalith repositioning, Visual/preceptual remediation/compensation, Orthotic/Fit  training, DME instructions, Aquatic Therapy, Manual therapy, and Re-evaluation  PLAN FOR NEXT SESSION: reassess for BPPV PRN, progress VOR exercises, progress vestibular input for balance - EC, head motions, unlevel surfaces    Westley Foots, PT, DPT, CBIS 03/23/23 1:13 PM

## 2023-03-29 ENCOUNTER — Ambulatory Visit: Payer: Medicare HMO | Admitting: Physical Therapy

## 2023-03-30 ENCOUNTER — Ambulatory Visit: Payer: Medicare HMO | Attending: Family Medicine

## 2023-03-30 DIAGNOSIS — R2681 Unsteadiness on feet: Secondary | ICD-10-CM | POA: Diagnosis not present

## 2023-03-30 DIAGNOSIS — R42 Dizziness and giddiness: Secondary | ICD-10-CM | POA: Insufficient documentation

## 2023-03-30 NOTE — Therapy (Signed)
OUTPATIENT PHYSICAL THERAPY VESTIBULAR TREATMENT- 10TH VISIT PROGRESS NOTE- DISCHARGE SUMMARY     Patient Name: [name] MRN: [id number] DOB:1965-06-29, 57 y.o., female Today's Date: 03/30/2023  PHYSICAL THERAPY DISCHARGE SUMMARY  Visits from Start of Care: 10  Current functional level related to goals / functional outcomes: See below   Remaining deficits: See below   Education / Equipment: PT POC, HEP, pathophys of BPPV and vestibular dx   Patient agrees to discharge. Patient goals were met. Patient is being discharged due to meeting the stated rehab goals.  Physical Therapy Progress Note   Dates of Reporting Period:02/13/23- 03/30/23  See Note below for Objective Data and Assessment of Progress/Goals.  Thank you for the referral of this patient. 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   Irritable bowel syndrome    Overweight(278.02)    Personal history of colonic polyps 09/19/2011   tubular adenoma   Sarcoidosis 03/20/2019   Segmental colitis (HCC)    Tubular adenoma of colon    Past Surgical History:  Procedure Laterality Date   COLONOSCOPY     FACET JOINT INJECTION  03/28/11   injections in neck   hysterectomy  2000   IR SACROPLASTY BILATERAL  06/18/2019   KNEE SURGERY Right     Right knee arthroscopic partial lateral meniscectomy (01/22/07),arthroscopic partial lateral meniscectomy 5/09   POLYPECTOMY     SPINE SURGERY  10/13/06   Posterior  interbody fusion (PLIF) L5-S1 (07/04/07)   WRIST FRACTURE SURGERY Left 2011   Due to injury at work in 2008.   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   Pain in joint of left elbow 08/29/2017   Pain in joint of right elbow 08/29/2017   Pain of left shoulder joint on movement 08/29/2017   Hx of adenomatous colonic polyps 11/14/2014   Chronic pain syndrome 09/04/2013   Epigastric abdominal tenderness 08/16/2013   Elbow pain, right 06/30/2013   Fibromyalgia 04/19/2012   IBS (irritable bowel syndrome) 04/19/2012   Joint pain 04/19/2012   Benign neoplasm of colon 09/19/2011   Colitis, nonspecific 09/19/2011   Hematochezia 09/19/2011   Abdominal pain, other specified site 09/08/2011   GERD (gastroesophageal reflux disease) 09/08/2011   General medical examination 04/13/2011   Cervical disc disease 03/15/2011   Multiple thyroid nodules 02/14/2011    PCP: Laurann Montana, MD REFERRING PROVIDER: Laurann Montana, MD  REFERRING DIAG: H81.10 (ICD-10-CM) - Benign paroxysmal vertigo, unspecified ear  THERAPY DIAG:  Unsteadiness on feet  Dizziness and giddiness  ONSET DATE: 01/12/2023  Rationale for Evaluation and Treatment: Rehabilitation  SUBJECTIVE:   SUBJECTIVE STATEMENT:  Patient reports doing fair. Feeling better today. Denies falls. Did hear from referral coordinator at PCP and got scheduled for neuro, but in the new year.   Pt accompanied by: self  PERTINENT HISTORY: anemia, arthritis, GERD, sarcoidosis  PAIN:  Are you having pain? No  There were no vitals filed for this visit.  PRECAUTIONS: Fall   PATIENT GOALS: "to not be dizzy"  OBJECTIVE:   DIAGNOSTIC FINDINGS: 1. Normal MRI appearance of the brain. No acute or focal lesion to explain the patient's symptoms. 2. C3-4 disc  protrusion with mild central canal stenosis. 3. Abnormal signal within the C3 vertebral body. This may be degenerative. Recommend MRI of the cervical spine without and with contrast for further evaluation. 4. Mild sphenoid sinus disease.  VESTIBULAR TREATMENT:    Motion Sensitivity Quotient  Intensity: 0 = none, 1 = Lightheaded, 2 = Mild, 3 = Moderate, 4 = Severe, 5 = Vomiting  Intensity  1. Sitting to supine 0  2. Supine to L side 0  3. Supine to R side 0  4. Supine to sitting 0  5. L Hallpike-Dix   6. Up from L    7. R Hallpike-Dix   8. Up from R  0  9. Sitting, head  tipped to L knee 0  10. Head up from L  knee 0  11. Sitting, head  tipped to R knee 0  12. Head up from R  knee 0  13. Sitting head turns x5 0  14.Sitting head nods x5 0  15. In stance, 180  turn to L  0  16. In stance, 180  turn to R 0      PATIENT EDUCATION: Education details: PT POC, exam findings, possible benefit from ENT if can get in before neuro  Person educated: Patient Education method: Explanation, Demonstration, Verbal cues, and Handouts Education comprehension: verbalized understanding, returned demonstration, and needs further education  HOME EXERCISE PROGRAM: Access Code: [email or web address] URL: [email or web address] Date: 03/21/2023 Prepared by: Sherlie Ban  Exercises - Wide Stance with Eyes Closed on Foam Pad  - 1 x daily - 7 x weekly - 3 sets - 30 hold - Standing on Foam Pad  - 1 x daily - 7 x weekly - 2 sets - 10 reps - Romberg Stance with Eyes Closed  - 1 x daily - 7 x weekly - 3 sets - 30 hold - Standing Tandem Balance with Counter Support  - 1 x daily - 7 x weekly - 3 sets - 30 hold - Walking with Head Rotation  - 1 x daily - 7 x weekly - 3 sets  Self epley PRN Sit to Side-Lying    Sit on edge of bed. 1. Turn head 45 to right. 2. Maintain head position and lie down slowly on left side. Hold until symptoms subside. 3. Sit up slowly. Hold until symptoms  subside. 4. Turn head 45 to left. 5. Maintain head position and lie down slowly on right side. Hold until symptoms subside. 6. Sit up slowly. Repeat sequence ___4_ times per session. Do __3__ sessions per day. Gaze Stabilization: Sitting    Keeping eyes on target on wall 5 feet away, tilt head down 15-30 and move head side to side for _30___ seconds. Repeat while moving head up and down for __30__ seconds. Do __3__ sessions per day.  GOALS: Goals reviewed with patient? Yes  SHORT TERM GOALS: Target date: 03/17/23  Pt will be independent with initial HEP for improved symptoms Baseline: to  be provided Goal status: INITIAL  2.  Patient will demonstrate (-) positional testing to indicate resolution of BPPV  Baseline: cleared on 9/17 Goal status: MET  3.  FOTO goal Baseline: staff did not capture  Goal status: N/A  4. DVA goal  Baseline: 2 line difference, 5/10 dizziness   Goal status: MET   LONG TERM GOALS: Target date: 04/14/23  Pt will be independent with initial HEP for improved symptom report Baseline: to be provided; provided Goal status: MET  2.  Pt will report </= 2/5 for all movements on MSQ to indicate improvement in motion sensitivity and improved activity tolerance.   Baseline: up to 4/5; 0/5 Goal status: MET  3.  FOTO goal Baseline: staff did not capture  Goal status: N/A  4. Pt will perform DVA with 1 line difference with 3/10 dizziness or less in order to demo improved VOR.   Baseline: 2 line difference, 5/10 dizziness; 1 line difference 3/10 dizziness  Goal status: MET  ASSESSMENT:  CLINICAL IMPRESSION: Patient met 3/3 LTG- agreeable to dc. Her dizziness is essentially resolved. However, she does still have general imbalance without known diagnosis that would contribute to this. PT recommending f/u with MD for further investigation before returning to PT PRN.    OBJECTIVE IMPAIRMENTS: decreased balance, decreased knowledge of condition, and  dizziness.   ACTIVITY LIMITATIONS: stairs, bed mobility, reach over head, locomotion level, and caring for others  PARTICIPATION LIMITATIONS: cleaning, interpersonal relationship, driving, shopping, and community activity  PERSONAL FACTORS: Age, Past/current experiences, Sex, Time since onset of injury/illness/exacerbation, Transportation, and 1-2 comorbidities: see above  are also affecting patient's functional outcome.   REHAB POTENTIAL: Good  CLINICAL DECISION MAKING: Evolving/moderate complexity  EVALUATION COMPLEXITY: Moderate   PLAN:  PT FREQUENCY: 2x/week  PT DURATION: 6 weeks  PLANNED INTERVENTIONS: Therapeutic exercises, Therapeutic activity, Neuromuscular re-education, Balance training, Gait training, Patient/Family education, Self Care, Joint mobilization, Stair training, Vestibular training, Canalith repositioning, Visual/preceptual remediation/compensation, Orthotic/Fit training, DME instructions, Aquatic Therapy, Manual therapy, and Re-evaluation  PLAN FOR NEXT SESSION: dc from PT   Westley Foots, PT, DPT, CBIS 03/30/23 10:43 AM

## 2023-03-31 ENCOUNTER — Encounter: Payer: Self-pay | Admitting: Neurology

## 2023-04-04 DIAGNOSIS — E538 Deficiency of other specified B group vitamins: Secondary | ICD-10-CM | POA: Diagnosis not present

## 2023-04-04 DIAGNOSIS — M810 Age-related osteoporosis without current pathological fracture: Secondary | ICD-10-CM | POA: Diagnosis not present

## 2023-04-04 DIAGNOSIS — Z0000 Encounter for general adult medical examination without abnormal findings: Secondary | ICD-10-CM | POA: Diagnosis not present

## 2023-04-04 DIAGNOSIS — F321 Major depressive disorder, single episode, moderate: Secondary | ICD-10-CM | POA: Diagnosis not present

## 2023-04-04 DIAGNOSIS — D649 Anemia, unspecified: Secondary | ICD-10-CM | POA: Diagnosis not present

## 2023-04-04 DIAGNOSIS — Z79899 Other long term (current) drug therapy: Secondary | ICD-10-CM | POA: Diagnosis not present

## 2023-04-04 DIAGNOSIS — E559 Vitamin D deficiency, unspecified: Secondary | ICD-10-CM | POA: Diagnosis not present

## 2023-04-04 DIAGNOSIS — E785 Hyperlipidemia, unspecified: Secondary | ICD-10-CM | POA: Diagnosis not present

## 2023-04-04 DIAGNOSIS — K753 Granulomatous hepatitis, not elsewhere classified: Secondary | ICD-10-CM | POA: Diagnosis not present

## 2023-04-04 DIAGNOSIS — K589 Irritable bowel syndrome without diarrhea: Secondary | ICD-10-CM | POA: Diagnosis not present

## 2023-04-04 DIAGNOSIS — K219 Gastro-esophageal reflux disease without esophagitis: Secondary | ICD-10-CM | POA: Diagnosis not present

## 2023-04-12 ENCOUNTER — Encounter: Payer: Self-pay | Admitting: Gastroenterology

## 2023-04-14 ENCOUNTER — Ambulatory Visit: Payer: Self-pay | Admitting: Cardiology

## 2023-04-27 ENCOUNTER — Telehealth: Payer: Self-pay | Admitting: *Deleted

## 2023-04-27 ENCOUNTER — Ambulatory Visit: Payer: Medicare HMO | Admitting: *Deleted

## 2023-04-27 VITALS — Ht 63.0 in | Wt 195.0 lb

## 2023-04-27 DIAGNOSIS — F330 Major depressive disorder, recurrent, mild: Secondary | ICD-10-CM | POA: Diagnosis not present

## 2023-04-27 DIAGNOSIS — Z860100 Personal history of colon polyps, unspecified: Secondary | ICD-10-CM

## 2023-04-27 MED ORDER — NA SULFATE-K SULFATE-MG SULF 17.5-3.13-1.6 GM/177ML PO SOLN
1.0000 | Freq: Once | ORAL | 0 refills | Status: AC
Start: 2023-04-27 — End: 2023-04-27

## 2023-04-27 NOTE — Progress Notes (Signed)
Pt's name and DOB verified at the beginning of the pre-visit wit 2 identifiers  Pt denies any difficulty with ambulating,sitting, laying down or rolling side to side  Gave both [facility] main # and MD on call # prior to instructions.   No egg or soy allergy known to patient   No issues known to pt with past sedation with any surgeries or procedures  Pt denies having issues being intubated  Patient denies ever being intubated Pt has no issues moving head neck or swallowing  No FH of Malignant Hyperthermia  Pt is not on diet pills or shots  Pt is not on home 02   Pt is not on blood thinners   Pt denies issues with constipation   Pt is not on dialysis  Pt denise any abnormal heart rhythms   Pt denies any upcoming cardiac testing  Pt encouraged to use to use Singlecare or Goodrx to reduce cost   Patient's chart reviewed by [name] CNRA prior to pre-visit and patient appropriate for the [facility].  Pre-visit completed and red dot placed by patient's name on their procedure day (on provider's schedule).  .  Visit by phone  Pt states weight is 195 lb  Instructed pt why it is important to and  to call if they have any changes in health or new medications. Directed them to the # given and on instructions.     Instructions reviewed with pt and pt states understanding. Instructed to review again prior to procedure. Pt states they will.   Instructions sent by mail with coupon and by my chart

## 2023-04-27 NOTE — Telephone Encounter (Signed)
Attempt to reach pt for pre-visit. LM with call back #.  Will attempt to reach again in 5 min due to no other # listed in profile Attempt to reach pt for pre-visit. LM with call back #.   Will attempt other number in profile  Will attempt to reach again in 5 min due to no other # listed in profile  Second attempt to reach pt for pre-vist unsuccessful. LM with facility # for pt to call back. Instructed pt to call # given by end of the day and reschedule the pre-visit  with RN or the scheduled procedure will be canceled.

## 2023-04-27 NOTE — Telephone Encounter (Signed)
Attempt to reach pt for pre-visit. LM with call back #.  Will attempt to reach again in 5 min due to no other # listed in profile  Second attempt to reach pt for pre-vist unsuccessful. LM with facility # for pt to call back. Instructed pt to call # given by end of the day and reschedule the pre-visit  with RN or the scheduled procedure will be canceled.

## 2023-04-30 ENCOUNTER — Ambulatory Visit
Admission: RE | Admit: 2023-04-30 | Discharge: 2023-04-30 | Disposition: A | Discharge status: Home / Self Care | Payer: Medicare HMO | Source: Ambulatory Visit | Attending: Physical Medicine and Rehabilitation | Admitting: Physical Medicine and Rehabilitation

## 2023-04-30 DIAGNOSIS — M4802 Spinal stenosis, cervical region: Secondary | ICD-10-CM | POA: Diagnosis not present

## 2023-04-30 DIAGNOSIS — R531 Weakness: Secondary | ICD-10-CM

## 2023-04-30 DIAGNOSIS — M542 Cervicalgia: Secondary | ICD-10-CM

## 2023-04-30 DIAGNOSIS — M5412 Radiculopathy, cervical region: Secondary | ICD-10-CM | POA: Diagnosis not present

## 2023-04-30 MED ORDER — GADOPICLENOL 0.5 MMOL/ML IV SOLN
9.0000 mL | Freq: Once | INTRAVENOUS | Status: AC | PRN
  Administered 2023-04-30: 9 mL via INTRAVENOUS

## 2023-05-02 ENCOUNTER — Ambulatory Visit: Payer: Medicare HMO | Admitting: Neurology

## 2023-05-02 ENCOUNTER — Encounter: Payer: Self-pay | Admitting: Neurology

## 2023-05-02 ENCOUNTER — Encounter: Payer: Self-pay | Admitting: Gastroenterology

## 2023-05-02 VITALS — BP 117/75 | HR 97 | Ht 63.0 in | Wt 199.0 lb

## 2023-05-02 DIAGNOSIS — R2681 Unsteadiness on feet: Secondary | ICD-10-CM

## 2023-05-02 NOTE — Patient Instructions (Signed)
Nerve testing of both legs.  ELECTROMYOGRAM AND NERVE CONDUCTION STUDIES (EMG/NCS) INSTRUCTIONS  How to Prepare The neurologist conducting the EMG will need to know if you have certain medical conditions. Tell the neurologist and other EMG lab personnel if you: . Have a pacemaker or any other electrical medical device . Take blood-thinning medications . Have hemophilia, a blood-clotting disorder that causes prolonged bleeding Bathing Take a shower or bath shortly before your exam in order to remove oils from your skin. Don't apply lotions or creams before the exam.  What to Expect You'll likely be asked to change into a hospital gown for the procedure and lie down on an examination table. The following explanations can help you understand what will happen during the exam.  . Electrodes. The neurologist or a technician places surface electrodes at various locations on your skin depending on where you're experiencing symptoms. Or the neurologist may insert needle electrodes at different sites depending on your symptoms.  . Sensations. The electrodes will at times transmit a tiny electrical current that you may feel as a twinge or spasm. The needle electrode may cause discomfort or pain that usually ends shortly after the needle is removed. If you are concerned about discomfort or pain, you may want to talk to the neurologist about taking a short break during the exam.  . Instructions. During the needle EMG, the neurologist will assess whether there is any spontaneous electrical activity when the muscle is at rest - activity that isn't present in healthy muscle tissue - and the degree of activity when you slightly contract the muscle.  He or she will give you instructions on resting and contracting a muscle at appropriate times. Depending on what muscles and nerves the neurologist is examining, he or she may ask you to change positions during the exam.  After your EMG You may experience some  temporary, minor bruising where the needle electrode was inserted into your muscle. This bruising should fade within several days. If it persists, contact your primary care doctor.    

## 2023-05-02 NOTE — Progress Notes (Signed)
[facility] Clinic Note - Initial Visit   Date: 05/02/2023   [name] MRN: [id number] DOB: 02-Oct-1965   Dear Dr. Cliffton Asters:  Thank you for your kind referral of [name] for consultation of imbalance. Although her history is well known to you, please allow [place] to reiterate it for the purpose of our medical record. The patient was accompanied to the clinic by self.    [name] is a 57 y.o. right-handed female with IBS, GERD, vitamin B12 deficiency, depression, lumbar fusion at L5-S1, chronic low back pain (followed by Dr. Maurice Small) presenting for evaluation of imbalance.   IMPRESSION/PLAN: Gait imbalance.  I am not sure what is causing her imbalance.  Her neurological exam shows normal strength, sensation, and reflexes.  There is no evidence of neuropathy. She has unsteadiness with tandem gait. She has known lumbar disease and it's possible she may have mechanical changes related to this.  She is followed by Dr. Maurice Small and tells me that she has imaging through their office, which I do not have to review.  I have viewed her MRI cervical spine which does not show compressive pathology. MRI brain also does not show any intracranial abnormalities.   To be complete, I will check NCS/EMG of the legs.  She has been using a cane for balance for many years.   ------------------------------------------------------------- History of present illness: She reports having problems with imbalance for the past year.  She had two falls.   She has some good days.  She has been using a cane since 2008.  She has not identified anything that makes her balance worse.  She has chronic back pain and gets injections by Dr. Maurice Small.  Prior MRI brain did not show any intracranial abnormalities.  MRI cervical spine shows mild central canal stenosis at C4-5, as well as foraminal stenosis at the left C4, however this would not cause imbalance.   She is not working. She has  work-related accident and has been on disability since 2008.  She lives at home with daughter.  Nonsmoker.  She does not drink alcohol.    Out-side paper records, electronic medical record, and images have been reviewed where available and summarized as:  MRI cervical spine wwo contrast 02/24/2023: 1. At C4-C5, moderate to severe right foraminal stenosis and mild canal stenosis. 2. At C7-T1, mild-to-moderate right foraminal stenosis.  MRI brain wwo contrast 01/15/2023: 1. Normal MRI appearance of the brain. No acute or focal lesion to explain the patient's symptoms. 2. C3-4 disc protrusion with mild central canal stenosis. 3. Abnormal signal within the C3 vertebral body. This may be degenerative. Recommend MRI of the cervical spine without and with contrast for further evaluation. 4. Mild sphenoid sinus disease.  MRI cervical spine wwo contrast 04/30/2023: 1. Widespread chronic cervical spine degeneration with degenerative appearing marrow signal heterogeneity and enhancement, stable from August MRI. 2. Up to mild spinal stenosis and mild cord mass effect at C4-C5 related to broad-based rightward posterior disc or disc osteophyte. No cervical spinal cord signal abnormality. 3. Degenerative moderate neural foraminal stenosis at the left C4, and moderate to severe at the right C5 nerve levels.    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   Hypertension    Irritable bowel syndrome    Overweight(278.02)    Personal history of colonic polyps 09/19/2011   tubular adenoma   Sarcoidosis 03/20/2019   Sarcoidosis    Segmental colitis (HCC)    Tubular adenoma of colon     Past Surgical History:   Procedure Laterality Date   COLONOSCOPY     FACET JOINT INJECTION  03/28/11   injections in neck   hysterectomy  2000   IR SACROPLASTY BILATERAL  06/18/2019   KNEE SURGERY Right     Right knee arthroscopic partial lateral meniscectomy (01/22/07),arthroscopic partial lateral meniscectomy 5/09   POLYPECTOMY     SPINE SURGERY  10/13/06   Posterior interbody fusion (PLIF) L5-S1 (07/04/07)   WRIST FRACTURE SURGERY Left 2011   Due to injury at work in 2008.     Medications:  Outpatient Encounter Medications as of 05/02/2023  Medication Sig   Acetaminophen 500 MG capsule  TAKE 2 TABLETS BY MOUTH EVERY 6 HOURS FOR 7 DAYS   Cyanocobalamin 2500 MCG SUBL Once a month   dicyclomine (BENTYL) 20 MG tablet TAKE 1 TABLET(20 MG) BY MOUTH FOUR TIMES DAILY BEFORE MEALS AND AT BEDTIME   escitalopram (LEXAPRO) 10 MG tablet Take 10 mg by mouth daily.   hyoscyamine (LEVSIN SL) 0.125 MG SL tablet DISSOLVE 1 TO 2 TABLETS UNDER THE TONGUE EVERY 4 HOURS AS NEEDED   losartan-hydrochlorothiazide (HYZAAR) 50-12.5 MG tablet Take 1 tablet by mouth daily.   meclizine (ANTIVERT) 25 MG tablet Take 25 mg by mouth at bedtime.   methocarbamol (ROBAXIN) 750 MG tablet Take 750 mg by mouth 2 (two) times daily as needed for muscle spasms.   nitroGLYCERIN (NITROSTAT) 0.4 MG SL tablet Place 1 tablet (0.4 mg total) under the tongue every 5 (five) minutes as needed for chest pain. If you require more than two tablets five minutes apart go to the nearest ER via EMS.   pantoprazole (PROTONIX) 40 MG tablet TAKE 1 TABLET BY MOUTH TWICE DAILY   traMADol (ULTRAM) 50 MG tablet Take 50 mg by mouth 3 (three) times daily as needed for moderate pain.    Vitamin D, Ergocalciferol, (DRISDOL) 1.25 MG (50000 UNIT) CAPS capsule Take 50,000 Units by mouth once a week.   fluticasone (FLONASE) 50 MCG/ACT nasal spray Place 2 sprays into both nostrils daily as needed. (Patient not taking: Reported on 04/27/2023)   [DISCONTINUED] omeprazole (PRILOSEC) 20  MG capsule Take 20 mg by mouth daily.   No facility-administered encounter medications on file as of 05/02/2023.    Allergies: No Known Allergies  Family History: Family History  Problem Relation Age of Onset   Asthma Mother    Cancer Mother        gastric tumor   Glaucoma Mother    Hyperlipidemia Mother    COPD Mother    Stomach cancer Mother    Esophageal cancer Father        mets to lung and liver   Hypertension Maternal Aunt    Colon cancer Neg Hx    Colon polyps Neg Hx    Rectal cancer Neg Hx    Ulcerative colitis Neg Hx     Social History: Social History   Tobacco Use   Smoking status: Never   Smokeless tobacco: Never  Vaping Use   Vaping status: Never Used  Substance Use Topics   Alcohol use: No  Alcohol/week: 0.0 standard drinks of alcohol   Drug use: No   Social History   Social History Narrative   Regular exercise:  NoCaffeine Use:  Occasional sweet teaShe is legally separated2 children ages 66 year old son and 39 year old daughter.  Both children.Previously worked in Engineering geologist, now disabled.Completed 2 yrs of college.        Are you right handed or left handed? Right Handed    Are you currently employed ? No    What is your current occupation? Disabled    Do you live at home alone? No   Who lives with you? Daughter   What type of home do you live in: 1 story or 2 story? Lives in a two story home.        Vital Signs:  BP 117/75   Pulse 97   Ht 5\' 3"  (1.6 m)   Wt 199 lb (90.3 kg)   LMP 02/25/2009   SpO2 100%   BMI 35.25 kg/m    Neurological Exam: MENTAL STATUS including orientation to time, place, person, recent and remote memory, attention span and concentration, language, and fund of knowledge is normal.  Speech is not dysarthric.  CRANIAL NERVES: II:  No visual field defects.     III-IV-VI: Pupils equal round and reactive to light.  Normal conjugate, extra-ocular eye movements in all directions of gaze.  No nystagmus.  No ptosis.   V:   Normal facial sensation.    VII:  Normal facial symmetry and movements.   VIII:  Normal hearing and vestibular function.   IX-X:  Normal palatal movement.   XI:  Normal shoulder shrug and head rotation.   XII:  Normal tongue strength and range of motion, no deviation or fasciculation.  MOTOR:  No atrophy, fasciculations or abnormal movements.  No pronator drift.   Upper Extremity:  Right  Left  Deltoid  5/5   5/5   Biceps  5/5   5/5   Triceps  5/5   5/5   Wrist extensors  5/5   5/5   Wrist flexors  5/5   5/5   Finger extensors  5/5   5/5   Finger flexors  5/5   5/5   Dorsal interossei  5/5   5/5   Abductor pollicis  5/5   5/5   Tone (Ashworth scale)  0  0   Lower Extremity:  Right  Left  Hip flexors  5/5   5/5   Knee flexors  5/5   5/5   Knee extensors  5/5   5/5   Dorsiflexors  5/5   5/5   Plantarflexors  5/5   5/5   Toe extensors  5/5   5/5   Toe flexors  5/5   5/5   Tone (Ashworth scale)  0  0   MSRs:                                           Right        Left brachioradialis 2+  2+  biceps 2+  2+  triceps 2+  2+  patellar 2+  2+  ankle jerk 2+  2+  Hoffman no  no  plantar response down  down   SENSORY:  Normal and symmetric perception of light touch, pinprick, vibration, and proprioception.  Romberg's sign absent.   COORDINATION/GAIT: Normal finger-to-  nose-finger. Nonphysiologic pattern of finger tapping.    Gait mildly wide-based, stable.  Stressed gait intact.  Unsteady with tandem gait.    Thank you for allowing me to participate in patient's care.  If I can answer any additional questions, I would be pleased to do so.    Sincerely,    Kamelia Lampkins K. Allena Katz, DO

## 2023-05-04 ENCOUNTER — Other Ambulatory Visit: Payer: Self-pay | Admitting: Cardiology

## 2023-05-04 DIAGNOSIS — I10 Essential (primary) hypertension: Secondary | ICD-10-CM

## 2023-05-05 DIAGNOSIS — E538 Deficiency of other specified B group vitamins: Secondary | ICD-10-CM | POA: Diagnosis not present

## 2023-05-09 DIAGNOSIS — M5412 Radiculopathy, cervical region: Secondary | ICD-10-CM | POA: Diagnosis not present

## 2023-05-09 DIAGNOSIS — M25561 Pain in right knee: Secondary | ICD-10-CM | POA: Diagnosis not present

## 2023-05-11 ENCOUNTER — Ambulatory Visit: Payer: Medicare HMO | Admitting: Gastroenterology

## 2023-05-11 ENCOUNTER — Encounter: Payer: Self-pay | Admitting: Gastroenterology

## 2023-05-11 VITALS — BP 151/74 | HR 74 | Temp 97.5°F | Resp 15 | Ht 63.0 in | Wt 195.0 lb

## 2023-05-11 DIAGNOSIS — Z860101 Personal history of adenomatous and serrated colon polyps: Secondary | ICD-10-CM

## 2023-05-11 DIAGNOSIS — Z09 Encounter for follow-up examination after completed treatment for conditions other than malignant neoplasm: Secondary | ICD-10-CM

## 2023-05-11 MED ORDER — SODIUM CHLORIDE 0.9 % IV SOLN
500.0000 mL | Freq: Once | INTRAVENOUS | Status: DC
Start: 2023-05-11 — End: 2023-05-11

## 2023-05-11 NOTE — Progress Notes (Signed)
A/O x 3, gd SR's, VSS, report to RN

## 2023-05-11 NOTE — Patient Instructions (Signed)
Repeat colonoscopy in 10 years for surveillance  Resume previous diet  Continue present medications    YOU HAD AN ENDOSCOPIC PROCEDURE TODAY AT THE [facility]:   Refer to the procedure report that was given to you for any specific questions about what was found during the examination.  If the procedure report does not answer your questions, please call your gastroenterologist to clarify.  If you requested that your care partner not be given the details of your procedure findings, then the procedure report has been included in a sealed envelope for you to review at your convenience later.  YOU SHOULD EXPECT: Some feelings of bloating in the abdomen. Passage of more gas than usual.  Walking can help get rid of the air that was put into your GI tract during the procedure and reduce the bloating. If you had a lower endoscopy (such as a colonoscopy or flexible sigmoidoscopy) you may notice spotting of blood in your stool or on the toilet paper. If you underwent a bowel prep for your procedure, you may not have a normal bowel movement for a few days.  Please Note:  You might notice some irritation and congestion in your nose or some drainage.  This is from the oxygen used during your procedure.  There is no need for concern and it should clear up in a day or so.  SYMPTOMS TO REPORT IMMEDIATELY:  Following lower endoscopy (colonoscopy or flexible sigmoidoscopy):  Excessive amounts of blood in the stool  Significant tenderness or worsening of abdominal pains  Swelling of the abdomen that is new, acute  Fever of 100F or higher   For urgent or emergent issues, a gastroenterologist can be reached at any hour by calling [phone]. Do not use MyChart messaging for urgent concerns.    DIET:  We do recommend a small meal at first, but then you may proceed to your regular diet.  Drink plenty of fluids but you should avoid alcoholic beverages for 24 hours.  ACTIVITY:  You should plan  to take it easy for the rest of today and you should NOT DRIVE or use heavy machinery until tomorrow (because of the sedation medicines used during the test).    FOLLOW UP: Our staff will call the number listed on your records the next business day following your procedure.  We will call around 7:15- 8:00 am to check on you and address any questions or concerns that you may have regarding the information given to you following your procedure. If we do not reach you, we will leave a message.     If any biopsies were taken you will be contacted by phone or by letter within the next 1-3 weeks.  Please call us at [phone] if you have not heard about the biopsies in 3 weeks.    SIGNATURES/CONFIDENTIALITY: You and/or your care partner have signed paperwork which will be entered into your electronic medical record.  These signatures attest to the fact that that the information above on your After Visit Summary has been reviewed and is understood.  Full responsibility of the confidentiality of this discharge information lies with you and/or your care-partner.

## 2023-05-11 NOTE — Progress Notes (Signed)
Pt's states no medical or surgical changes since previsit or office visit. 

## 2023-05-11 NOTE — Progress Notes (Signed)
History & Physical  Primary Care Physician:  Laurann Montana, MD Primary Gastroenterologist: Claudette Head, MD  Impression / Plan:  Personal history of adenomatous colon polyps for surveillance colonoscopy  CHIEF COMPLAINT:  Personal history of adenomatous colon polyps   HPI: [name] is a 57 y.o. female with a history of adenomatous colon polyps for surveillance colonoscopy.    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   Hypertension    Irritable bowel syndrome    Overweight(278.02)    Personal history of colonic polyps 09/19/2011   tubular adenoma   Sarcoidosis 03/20/2019   Sarcoidosis    Segmental colitis (HCC)    Tubular adenoma of colon     Past Surgical History:  Procedure Laterality Date   COLONOSCOPY     FACET JOINT INJECTION  03/28/11   injections in neck   hysterectomy  2000   IR SACROPLASTY BILATERAL  06/18/2019   KNEE SURGERY Right     Right knee arthroscopic partial lateral meniscectomy (01/22/07),arthroscopic partial lateral meniscectomy 5/09   POLYPECTOMY     SPINE SURGERY  10/13/06   Posterior interbody fusion (PLIF) L5-S1 (07/04/07)   WRIST FRACTURE SURGERY Left 2011   Due to injury at work in 2008.    Prior to Admission medications   Medication Sig Start Date End Date Taking? Authorizing Provider  Acetaminophen 500 MG capsule  TAKE 2 TABLETS BY MOUTH EVERY 6 HOURS FOR 7 DAYS 11/07/19  Yes [provider]  dicyclomine (BENTYL) 20 MG tablet TAKE 1 TABLET(20 MG) BY MOUTH FOUR TIMES DAILY BEFORE MEALS AND AT BEDTIME 07/06/22  Yes Meryl Dare, MD  hyoscyamine (LEVSIN SL) 0.125 MG SL tablet DISSOLVE 1 TO 2 TABLETS UNDER THE TONGUE EVERY 4 HOURS AS NEEDED 07/06/22  Yes Meryl Dare, MD  losartan-hydrochlorothiazide [facility]) 50-12.5 MG tablet TAKE 1 TABLET BY MOUTH DAILY 05/04/23 06/03/23 Yes Tolia, Sunit, DO  meclizine (ANTIVERT) 25 MG tablet  Take 25 mg by mouth at bedtime. 11/24/22  Yes [provider]  methocarbamol (ROBAXIN) 750 MG tablet Take 750 mg by mouth 2 (two) times daily as needed for muscle spasms.   Yes [provider]  pantoprazole (PROTONIX) 40 MG tablet TAKE 1 TABLET BY MOUTH TWICE DAILY 07/13/22  Yes Meryl Dare, MD  traMADol (ULTRAM) 50 MG tablet Take 50 mg by mouth 3 (three) times daily as needed for moderate pain.    Yes [provider]  Cyanocobalamin 2500 MCG SUBL Once a month    [provider]  escitalopram (LEXAPRO) 10 MG tablet Take 10 mg by mouth daily. 07/19/21   [provider]  fluticasone (FLONASE) 50 MCG/ACT nasal spray Place 2 sprays into both nostrils daily as needed. Patient not taking: Reported on 04/27/2023 06/24/22 06/24/23  [provider]  nitroGLYCERIN (NITROSTAT) 0.4 MG SL tablet Place 1 tablet (0.4 mg total) under the tongue every 5 (five) minutes as needed for chest pain. If you require more than two tablets five minutes apart go to the nearest ER via EMS. 12/08/22 05/02/23  Tolia, Sunit, DO  Vitamin D, Ergocalciferol, (DRISDOL) 1.25 MG (50000 UNIT) CAPS capsule Take 50,000 Units by mouth once a week.    [provider]  omeprazole (PRILOSEC) 20 MG capsule Take 20 mg by mouth daily.  09/08/11  [provider]    Current Outpatient Medications  Medication Sig  Dispense Refill   Acetaminophen 500 MG capsule  TAKE 2 TABLETS BY MOUTH EVERY 6 HOURS FOR 7 DAYS     dicyclomine (BENTYL) 20 MG tablet TAKE 1 TABLET(20 MG) BY MOUTH FOUR TIMES DAILY BEFORE MEALS AND AT BEDTIME 120 tablet 11   hyoscyamine (LEVSIN SL) 0.125 MG SL tablet DISSOLVE 1 TO 2 TABLETS UNDER THE TONGUE EVERY 4 HOURS AS NEEDED 120 tablet 11   losartan-hydrochlorothiazide (HYZAAR) 50-12.5 MG tablet TAKE 1 TABLET BY MOUTH DAILY 30 tablet 0   meclizine (ANTIVERT) 25 MG tablet Take 25 mg by mouth at bedtime.     methocarbamol (ROBAXIN) 750 MG tablet Take 750 mg by  mouth 2 (two) times daily as needed for muscle spasms.     pantoprazole (PROTONIX) 40 MG tablet TAKE 1 TABLET BY MOUTH TWICE DAILY 60 tablet 11   traMADol (ULTRAM) 50 MG tablet Take 50 mg by mouth 3 (three) times daily as needed for moderate pain.      Cyanocobalamin 2500 MCG SUBL Once a month     escitalopram (LEXAPRO) 10 MG tablet Take 10 mg by mouth daily.     fluticasone (FLONASE) 50 MCG/ACT nasal spray Place 2 sprays into both nostrils daily as needed. (Patient not taking: Reported on 04/27/2023)     nitroGLYCERIN (NITROSTAT) 0.4 MG SL tablet Place 1 tablet (0.4 mg total) under the tongue every 5 (five) minutes as needed for chest pain. If you require more than two tablets five minutes apart go to the nearest ER via EMS. 30 tablet 0   Vitamin D, Ergocalciferol, (DRISDOL) 1.25 MG (50000 UNIT) CAPS capsule Take 50,000 Units by mouth once a week.     Current Facility-Administered Medications  Medication Dose Route Frequency Provider Last Rate Last Admin   0.9 %  sodium chloride infusion  500 mL Intravenous Once Meryl Dare, MD        Allergies as of 05/11/2023   (No Known Allergies)    Family History  Problem Relation Age of Onset   Asthma Mother    Cancer Mother        gastric tumor   Glaucoma Mother    Hyperlipidemia Mother    COPD Mother    Stomach cancer Mother    Esophageal cancer Father        mets to lung and liver   Hypertension Maternal Aunt    Colon cancer Neg Hx    Colon polyps Neg Hx    Rectal cancer Neg Hx    Ulcerative colitis Neg Hx     Social History   Socioeconomic History   Marital status: Divorced    Spouse name: Not on file   Number of children: 2   Years of education: Not on file   Highest education level: Not on file  Occupational History   Occupation: Disabled  Tobacco Use   Smoking status: Never   Smokeless tobacco: Never  Vaping Use   Vaping status: Never Used  Substance and Sexual Activity   Alcohol use: No    Alcohol/week: 0.0  standard drinks of alcohol   Drug use: No   Sexual activity: Yes    Birth control/protection: Post-menopausal  Other Topics Concern   Not on file  Social History Narrative   Regular exercise:  NoCaffeine Use:  Occasional sweet teaShe is legally separated2 children ages 58 year old son and 71 year old daughter.  Both children.Previously worked in Engineering geologist, now disabled.Completed 2 yrs of college.  Are you right handed or left handed? Right Handed    Are you currently employed ? No    What is your current occupation? Disabled    Do you live at home alone? No   Who lives with you? Daughter   What type of home do you live in: 1 story or 2 story? Lives in a two story home.       Social Determinants of Health   Financial Resource Strain: Not on file  Food Insecurity: Not on file  Transportation Needs: Not on file  Physical Activity: Not on file  Stress: Not on file  Social Connections: Not on file  Intimate Partner Violence: Not on file    Review of Systems:  All systems reviewed were negative except where noted in HPI.   Physical Exam:   General:  Alert, well-developed, in NAD Head:  Normocephalic and atraumatic. Eyes:  Sclera clear, no icterus.   Conjunctiva pink. Ears:  Normal auditory acuity. Mouth:  No deformity or lesions.  Neck:  Supple; no masses. Lungs:  Clear throughout to auscultation.   No wheezes, crackles, or rhonchi.  Heart:  Regular rate and rhythm; no murmurs. Abdomen:  Soft, nondistended, nontender. No masses, hepatomegaly. No palpable masses.  Normal bowel sounds.    Rectal:  Deferred   Msk:  Symmetrical without gross deformities. Extremities:  Without edema. Neurologic:  Alert and  oriented x 4; grossly normal neurologically. Skin:  Intact without significant lesions or rashes. Psych:  Alert and cooperative. Normal mood and affect.   Venita Lick. Russella Dar  05/11/2023, 9:14 AM See Loretha Stapler, [facility], to contact our on call provider

## 2023-05-11 NOTE — Op Note (Signed)
[facility] Patient Name: [name] Procedure Date: 05/11/2023 9:19 AM MRN: [id number] Endoscopist: Meryl Dare , MD, [phone] Age: 57 Referring MD:  Date of Birth: [birth year] Gender: Female Account #: [id number] Procedure:                Colonoscopy Indications:              Surveillance: Personal history of adenomatous                            polyps on last colonoscopy > 5 years ago Medicines:                Monitored Anesthesia Care Procedure:                Pre-Anesthesia Assessment:                           - Prior to the procedure, a History and Physical                            was performed, and patient medications and                            allergies were reviewed. The patient's tolerance of                            previous anesthesia was also reviewed. The risks                            and benefits of the procedure and the sedation                            options and risks were discussed with the patient.                            All questions were answered, and informed consent                            was obtained. Prior Anticoagulants: The patient has                            taken no anticoagulant or antiplatelet agents. ASA                            Grade Assessment: II - A patient with mild systemic                            disease. After reviewing the risks and benefits,                            the patient was deemed in satisfactory condition to                            undergo the procedure.  After obtaining informed consent, the colonoscope                            was passed under direct vision. Throughout the                            procedure, the patient's blood pressure, pulse, and                            oxygen saturations were monitored continuously. The                            CF [id number] #[id number] was introduced through the anus                            and advanced to  the the cecum, identified by                            appendiceal orifice and ileocecal valve. The                            ileocecal valve, appendiceal orifice, and rectum                            were photographed. The quality of the bowel                            preparation was good after substantial lavage,                            suction. The patient tolerated the procedure well.                            The colonoscopy was somewhat difficult due to                            restricted mobility of the left colon, significant                            looping and a tortuous colon. Successful completion                            of the procedure was aided by withdrawing the scope                            and replacing with the pediatric colonoscope.                            Unable to safely retroflex. Scope In: 9:25:25 AM Scope Out: 9:49:30 AM Scope Withdrawal Time: 0 hours 12 minutes 12 seconds  Total Procedure Duration: 0 hours 24 minutes 5 seconds  Findings:                 The perianal and digital rectal examinations were  normal.                           The exam was otherwise without abnormality on                            direct views. Complications:            No immediate complications. Estimated blood loss:                            None. Estimated Blood Loss:     Estimated blood loss: none. Impression:               - The examination was otherwise normal on direct                            views.                           - No specimens collected. Recommendation:           - Repeat colonoscopy in 10 years for surveillance.                           - Patient has a contact number available for                            emergencies. The signs and symptoms of potential                            delayed complications were discussed with the                            patient. Return to normal activities tomorrow.                             Written discharge instructions were provided to the                            patient.                           - Resume previous diet.                           - Continue present medications. Meryl Dare, MD 05/11/2023 9:55:41 AM This report has been signed electronically.

## 2023-05-12 ENCOUNTER — Telehealth: Payer: Self-pay | Admitting: *Deleted

## 2023-05-12 NOTE — Telephone Encounter (Signed)
Left message on f/u call 

## 2023-05-16 ENCOUNTER — Ambulatory Visit: Payer: Medicare HMO | Attending: Cardiology | Admitting: Cardiology

## 2023-05-16 ENCOUNTER — Encounter: Payer: Self-pay | Admitting: Cardiology

## 2023-05-16 VITALS — BP 120/82 | HR 88 | Resp 16 | Ht 63.0 in | Wt 195.0 lb

## 2023-05-16 DIAGNOSIS — I10 Essential (primary) hypertension: Secondary | ICD-10-CM | POA: Diagnosis not present

## 2023-05-16 DIAGNOSIS — R200 Anesthesia of skin: Secondary | ICD-10-CM

## 2023-05-16 DIAGNOSIS — M79604 Pain in right leg: Secondary | ICD-10-CM

## 2023-05-16 DIAGNOSIS — M79605 Pain in left leg: Secondary | ICD-10-CM

## 2023-05-16 NOTE — Progress Notes (Signed)
Cardiology Office Note:  .   Date:  05/16/2023  ID:  [name], DOB 1965/12/06, MRN [id number] PCP:  Laurann Montana, MD  Former Cardiology Providers: None [facility] Providers Cardiologist:  Tessa Lerner, DO , [facility] (established care 12/2022) Electrophysiologist:  None  Click to update primary MD,subspecialty MD or APP then REFRESH:1}    Chief Complaint  Patient presents with   Benign hypertension   Follow-up    History of Present Illness: .   [name] is a 57 y.o. African-American female whose past medical history and cardiovascular risk factors includes:  Benign essential hypertension, depression, irritable bowel syndrome, chronic fatigue, GERD, necrotizing granuloma of the liver.  Patient was referred to the practice for abnormal EKG and chest pain.  With regards to her chest pain workup she did undergo echo and coronary CTA.  She was noted to have coronary calcium score of 0 and no evidence of CAD on coronary CTA.  At the last office visit the shared decision was to start antihypertensive medications and patient was agreeable.  Patient presents today for follow-up.  Office blood pressures and home numbers are very well-controlled on current medical therapy.  She notes incidental concerns for numbness and bilateral hand and also white fingertips when exposed to cold and warm weather condition.  She also has pain in the right hip radiating down the groin.  Patient states that she has followed up with neurology and states that she was told it is a vascular problem.  No prior history of Raynaud's.  Review of Systems: .   Review of Systems  Cardiovascular:  Negative for chest pain, claudication, irregular heartbeat, leg swelling, near-syncope, orthopnea, palpitations, paroxysmal nocturnal dyspnea and syncope.  Respiratory:  Negative for shortness of breath.   Hematologic/Lymphatic: Negative for bleeding problem.  Neurological:  Positive for numbness and  paresthesias.    Studies Reviewed:   EKG: December 08, 2022: Sinus rhythm, 78 bpm, normal axis, TWI consider anterior/lateral ischemia.  Echocardiogram: July 2024: LVEF 50-55%. Normal diastolic function. Mild MR. See report for additional details  Coronary CTA: July 2024: 1. Total coronary calcium score of 0.   2. Normal coronary origin with right dominance.   3. CAD-RADS = 0 No evidence of CAD.   Radiology over read: 1. No acute or significant extracardiac abnormality.   RECOMMENDATIONS:   Consider non-atherosclerotic causes of chest pain.   Carotid artery duplex 01/06/2023:  The bifurcation, internal, external and common carotid arteries reveal no evidence of significant stenosis, bilaterally.  Antegrade right vertebral artery flow. Antegrade left vertebral artery flow.   RADIOLOGY: NA  Risk Assessment/Calculations:   NA   Labs:       Latest Ref Rng & Units 03/25/2020   10:09 AM 06/18/2019    7:00 AM 03/07/2019   11:30 AM  CBC  WBC 4.0 - 10.5 K/uL 6.2  9.2  10.6   Hemoglobin 12.0 - 15.0 g/dL 40.9  81.1  91.4   Hematocrit 36.0 - 46.0 % 33.7  34.2  36.0   Platelets 150.0 - 400.0 K/uL 411.0  559  407.0        Latest Ref Rng & Units 12/21/2022    3:10 PM 03/25/2020   10:09 AM 03/07/2019   11:30 AM  BMP  Glucose 70 - 99 mg/dL 94  88  782   BUN 6 - 24 mg/dL 12  8  9    Creatinine 0.57 - 1.00 mg/dL 9.56  2.13  0.86   BUN/Creat  Ratio 9 - 23 14     Sodium 134 - 144 mmol/L 140  141  140   Potassium 3.5 - 5.2 mmol/L 4.0  3.2  3.5   Chloride 96 - 106 mmol/L 104  104  101   CO2 20 - 29 mmol/L 21  31  34   Calcium 8.7 - 10.2 mg/dL 9.4  9.1  9.3       Latest Ref Rng & Units 12/21/2022    3:10 PM 03/25/2020   10:09 AM 03/07/2019   11:30 AM  CMP  Glucose 70 - 99 mg/dL 94  88  829   BUN 6 - 24 mg/dL 12  8  9    Creatinine 0.57 - 1.00 mg/dL 5.62  1.30  8.65   Sodium 134 - 144 mmol/L 140  141  140   Potassium 3.5 - 5.2 mmol/L 4.0  3.2  3.5   Chloride 96 - 106 mmol/L  104  104  101   CO2 20 - 29 mmol/L 21  31  34   Calcium 8.7 - 10.2 mg/dL 9.4  9.1  9.3   Total Protein 6.0 - 8.3 g/dL   7.7   Total Bilirubin 0.2 - 1.2 mg/dL   0.4   Alkaline Phos 39 - 117 U/L   94   AST 0 - 37 U/L   12   ALT 0 - 35 U/L   9     Lab Results  Component Value Date   CHOL 162 04/13/2011   HDL 49 04/13/2011   LDLCALC 94 04/13/2011   TRIG 94 04/13/2011   CHOLHDL 3.3 04/13/2011   No results for input(s): "LIPOA" in the last 8760 hours. No components found for: "NTPROBNP" No results for input(s): "PROBNP" in the last 8760 hours. No results for input(s): "TSH" in the last 8760 hours.  External Labs: Collected: Nov 24 2022 provided by PCP. TSH 0.65. BUN 16, creatinine 0.84. AST 15, ALT 7, alkaline phosphatase 93. Hemoglobin 11.3, hematocrit 34.1%     Collected: July 12, 2022 provided by PCP. BUN 19, creatinine 0.92. Sodium 140, potassium 4, chloride 103, bicarb 30 AST 23, ALT 27, alkaline phosphatase 90. Hemoglobin 12.7, hematocrit 38.7%  Physical Exam:    Today's Vitals   05/16/23 0950  BP: 120/82  Pulse: 88  Resp: 16  SpO2: 95%  Weight: 195 lb (88.5 kg)  Height: 5\' 3"  (1.6 m)   Body mass index is 34.54 kg/m. Wt Readings from Last 3 Encounters:  05/16/23 195 lb (88.5 kg)  05/11/23 195 lb (88.5 kg)  05/02/23 199 lb (90.3 kg)    Physical Exam  Constitutional: No distress.  HENT:  Nose: Nose normal. No nasal discharge.  Eyes: Conjunctivae are normal.  Neck: No JVD present.  Cardiovascular: Normal rate, regular rhythm, S1 normal and S2 normal. Exam reveals no gallop, no S3 and no S4.  No murmur heard. Pulses:      Carotid pulses are 2+ on the right side with bruit and 2+ on the left side.      Radial pulses are 2+ on the right side and 2+ on the left side.       Femoral pulses are 1+ on the right side and 1+ on the left side.      Dorsalis pedis pulses are 2+ on the right side and 2+ on the left side.       Posterior tibial pulses are 2+ on  the right side and 2+ on the left  side.  Pulmonary/Chest: Effort normal and breath sounds normal. No stridor. She has no wheezes. She has no rales. She exhibits no tenderness.  Abdominal: Soft. Bowel sounds are normal. She exhibits no distension. There is no abdominal tenderness.  Musculoskeletal:        General: No tenderness or edema. Normal range of motion.     Cervical back: Neck supple.  Neurological: She is alert and oriented to person, place, and time.  Skin: Skin is warm and dry. No rash noted. No cyanosis. No jaundice. Nails show no clubbing.     Impression & Recommendation(s):  Impression:   ICD-10-CM   1. Benign hypertension  I10     2. Hand numbness  R20.0     3. Pain in both lower extremities  M79.604 VAS [place] ABI WITH/WO TBI   M79.605        Recommendation(s):  Benign hypertension Office blood pressures are very well-controlled. Continue losartan/hydrochlorothiazide 50/12.5 mg p.o. daily. Reemphasized importance of low-salt diet No additional cardiovascular testing warranted at this time.  Hand numbness Pain in both lower extremities The numbness that she is complaining of in bilateral arms with highly suggestive of radicular discomfort. The pain that she is also experiencing from her right hip into the groin likely suggestive of radicular discomfort. However, patient states that she has been evaluated neurology and was told that it could be a vascular etiology. Will proceed forward with ABIs of the lower extremity and if abnormal will proceed forward with arterial duplex. Will also schedule her for upper extremity arterial duplex to evaluate for possible Raynaud's.  Further recommendations to follow.  Orders Placed:  No orders of the defined types were placed in this encounter.  Final Medication List:   No orders of the defined types were placed in this encounter.   Medications Discontinued During This Encounter  Medication Reason   fluticasone (FLONASE)  50 MCG/ACT nasal spray Patient Preference   Vitamin D, Ergocalciferol, (DRISDOL) 1.25 MG (50000 UNIT) CAPS capsule Patient Preference     Current Outpatient Medications:    Acetaminophen 500 MG capsule,  TAKE 2 TABLETS BY MOUTH EVERY 6 HOURS FOR 7 DAYS, Disp: , Rfl:    Cyanocobalamin 2500 MCG SUBL, Once a month, Disp: , Rfl:    dicyclomine (BENTYL) 20 MG tablet, TAKE 1 TABLET(20 MG) BY MOUTH FOUR TIMES DAILY BEFORE MEALS AND AT BEDTIME, Disp: 120 tablet, Rfl: 11   escitalopram (LEXAPRO) 10 MG tablet, Take 10 mg by mouth daily., Disp: , Rfl:    hyoscyamine (LEVSIN SL) 0.125 MG SL tablet, DISSOLVE 1 TO 2 TABLETS UNDER THE TONGUE EVERY 4 HOURS AS NEEDED, Disp: 120 tablet, Rfl: 11   losartan-hydrochlorothiazide (HYZAAR) 50-12.5 MG tablet, TAKE 1 TABLET BY MOUTH DAILY, Disp: 30 tablet, Rfl: 0   meclizine (ANTIVERT) 25 MG tablet, Take 25 mg by mouth at bedtime., Disp: , Rfl:    methocarbamol (ROBAXIN) 750 MG tablet, Take 750 mg by mouth 2 (two) times daily as needed for muscle spasms., Disp: , Rfl:    pantoprazole (PROTONIX) 40 MG tablet, TAKE 1 TABLET BY MOUTH TWICE DAILY, Disp: 60 tablet, Rfl: 11   traMADol (ULTRAM) 50 MG tablet, Take 50 mg by mouth 3 (three) times daily as needed for moderate pain. , Disp: , Rfl:    nitroGLYCERIN (NITROSTAT) 0.4 MG SL tablet, Place 1 tablet (0.4 mg total) under the tongue every 5 (five) minutes as needed for chest pain. If you require more than two tablets five minutes apart  go to the nearest ER via EMS., Disp: 30 tablet, Rfl: 0  Consent:   NA  Disposition:   1 year follow up   Patient may be asked to follow-up sooner based on the results of the above-mentioned testing.  Her questions and concerns were addressed to her satisfaction. She voices understanding of the recommendations provided during this encounter.    Signed, Tessa Lerner, DO, [facility]  [facility]  [address] [place], [place] [address] 05/16/2023 5:33 PM

## 2023-05-16 NOTE — Patient Instructions (Addendum)
Medication Instructions:  Your physician recommends that you continue on your current medications as directed. Please refer to the Current Medication list given to you today.  *If you need a refill on your cardiac medications before your next appointment, please call your pharmacy*  Lab Work: None ordered today. If you have labs (blood work) drawn today and your tests are completely normal, you will receive your results only by: MyChart Message (if you have MyChart) OR A paper copy in the mail If you have any lab test that is abnormal or we need to change your treatment, we will call you to review the results.  Testing/Procedures: Your physician has requested that you have an ankle brachial index (ABI). During this test an ultrasound and blood pressure cuff are used to evaluate the arteries that supply the arms and legs with blood. Allow thirty minutes for this exam. There are no restrictions or special instructions.  Please note: We ask at that you not bring children with you during ultrasound (echo/ vascular) testing. Due to room size and safety concerns, children are not allowed in the ultrasound rooms during exams. Our front office staff cannot provide observation of children in our lobby area while testing is being conducted. An adult accompanying a patient to their appointment will only be allowed in the ultrasound room at the discretion of the ultrasound technician under special circumstances. We apologize for any inconvenience.   Follow-Up: At [facility], you and your health needs are our priority.  As part of our continuing mission to provide you with exceptional heart care, we have created designated Provider Care Teams.  These Care Teams include your primary Cardiologist (physician) and Advanced Practice Providers (APPs -  Physician Assistants and Nurse Practitioners) who all work together to provide you with the care you need, when you need it.  Your next appointment:   1  year(s)  The format for your next appointment:   In Person  Provider:   Tessa Lerner, DO {  Other Instructions Your provider has requested you have a vascular ultrasound of the hand. The order will be placed and you will be contacted about scheduling this appointment at a later date.

## 2023-05-22 ENCOUNTER — Other Ambulatory Visit: Payer: Self-pay

## 2023-05-22 DIAGNOSIS — R200 Anesthesia of skin: Secondary | ICD-10-CM

## 2023-05-30 DIAGNOSIS — H9311 Tinnitus, right ear: Secondary | ICD-10-CM | POA: Diagnosis not present

## 2023-05-30 DIAGNOSIS — R42 Dizziness and giddiness: Secondary | ICD-10-CM | POA: Diagnosis not present

## 2023-05-30 DIAGNOSIS — H9041 Sensorineural hearing loss, unilateral, right ear, with unrestricted hearing on the contralateral side: Secondary | ICD-10-CM | POA: Diagnosis not present

## 2023-06-01 DIAGNOSIS — M25561 Pain in right knee: Secondary | ICD-10-CM | POA: Diagnosis not present

## 2023-06-06 ENCOUNTER — Ambulatory Visit (HOSPITAL_COMMUNITY)
Admission: RE | Admit: 2023-06-06 | Payer: Medicare HMO | Source: Ambulatory Visit | Attending: Cardiology | Admitting: Cardiology

## 2023-06-08 ENCOUNTER — Ambulatory Visit (HOSPITAL_COMMUNITY)
Admission: RE | Admit: 2023-06-08 | Discharge: 2023-06-08 | Disposition: A | Discharge status: Home / Self Care | Payer: Medicare HMO | Source: Ambulatory Visit | Attending: Cardiology | Admitting: Cardiology

## 2023-06-08 DIAGNOSIS — R200 Anesthesia of skin: Secondary | ICD-10-CM | POA: Insufficient documentation

## 2023-06-09 DIAGNOSIS — G8929 Other chronic pain: Secondary | ICD-10-CM | POA: Diagnosis not present

## 2023-06-09 DIAGNOSIS — M47812 Spondylosis without myelopathy or radiculopathy, cervical region: Secondary | ICD-10-CM | POA: Diagnosis not present

## 2023-06-12 ENCOUNTER — Other Ambulatory Visit: Payer: Self-pay | Admitting: Cardiology

## 2023-06-12 DIAGNOSIS — I10 Essential (primary) hypertension: Secondary | ICD-10-CM

## 2023-06-19 ENCOUNTER — Other Ambulatory Visit: Payer: Self-pay

## 2023-06-19 ENCOUNTER — Ambulatory Visit (HOSPITAL_COMMUNITY)
Admission: RE | Admit: 2023-06-19 | Payer: Medicare HMO | Source: Ambulatory Visit | Attending: Cardiology | Admitting: Cardiology

## 2023-06-19 DIAGNOSIS — R200 Anesthesia of skin: Secondary | ICD-10-CM

## 2023-06-19 DIAGNOSIS — I10 Essential (primary) hypertension: Secondary | ICD-10-CM

## 2023-06-19 MED ORDER — AMLODIPINE BESYLATE 2.5 MG PO TABS: 2.5000 mg | ORAL_TABLET | Freq: Every evening | ORAL | 3 refills | Status: AC

## 2023-06-20 DIAGNOSIS — M5412 Radiculopathy, cervical region: Secondary | ICD-10-CM | POA: Diagnosis not present

## 2023-06-29 ENCOUNTER — Encounter: Payer: Self-pay | Admitting: Neurology

## 2023-06-29 ENCOUNTER — Encounter: Payer: Medicare HMO | Admitting: Neurology

## 2023-06-29 DIAGNOSIS — Z029 Encounter for administrative examinations, unspecified: Secondary | ICD-10-CM

## 2023-06-30 DIAGNOSIS — M5412 Radiculopathy, cervical region: Secondary | ICD-10-CM | POA: Diagnosis not present

## 2023-07-10 ENCOUNTER — Ambulatory Visit: Payer: Medicare HMO | Admitting: Diagnostic Neuroimaging

## 2023-07-10 ENCOUNTER — Encounter: Payer: Self-pay | Admitting: Diagnostic Neuroimaging

## 2023-07-13 ENCOUNTER — Telehealth: Payer: Self-pay | Admitting: Gastroenterology

## 2023-07-13 MED ORDER — DICYCLOMINE HCL 20 MG PO TABS: ORAL_TABLET | ORAL | 5 refills | Status: DC

## 2023-07-13 MED ORDER — HYOSCYAMINE SULFATE 0.125 MG SL SUBL: SUBLINGUAL_TABLET | SUBLINGUAL | 5 refills | Status: DC

## 2023-07-13 MED ORDER — PANTOPRAZOLE SODIUM 40 MG PO TBEC: DELAYED_RELEASE_TABLET | ORAL | 5 refills | Status: DC

## 2023-07-13 NOTE — Telephone Encounter (Signed)
Patient requesting medication refill for levsin, Protonix and bentyl. Please advise.

## 2023-07-13 NOTE — Telephone Encounter (Signed)
Prescriptions sent to patient's pharmacy.

## 2023-07-13 NOTE — Telephone Encounter (Signed)
Patient is requesting a refill of Levsin, pantoprazole and bentyl. Dr. Leone Payor, you are DOD. Please advise if patient can have refills.

## 2023-07-13 NOTE — Telephone Encounter (Signed)
[place] to refill x 1 year 

## 2023-07-24 NOTE — Progress Notes (Deleted)
 Patient name: [name] MRN: [id number] DOB: [birth year] Sex: female  REASON FOR CONSULT: Evaluation and management of Raynaud's  HPI: [name] is a 58 y.o. female, with history of hypertension, sarcoid, GERD the presents for evaluation of management of Raynaud's.  In review the notes she had bilateral hand pain with white fingertips when exposed to the cold.  She then was sent for vascular studies with her cardiologist.  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   Hypertension    Irritable bowel syndrome    Overweight(278.02)    Personal history of colonic polyps 09/19/2011   tubular adenoma   Sarcoidosis 03/20/2019   Sarcoidosis    Segmental colitis (HCC)    Tubular adenoma of colon     Past Surgical History:  Procedure Laterality Date   COLONOSCOPY     FACET JOINT INJECTION  03/28/11   injections in neck   hysterectomy  2000   IR SACROPLASTY BILATERAL  06/18/2019   KNEE SURGERY Right     Right knee arthroscopic partial lateral meniscectomy (01/22/07),arthroscopic partial lateral meniscectomy 5/09   POLYPECTOMY     SPINE SURGERY  10/13/06   Posterior interbody fusion (PLIF) L5-S1 (07/04/07)   WRIST FRACTURE SURGERY Left 2011   Due to injury at work in 2008.    Family History  Problem Relation Age of Onset   Asthma Mother    Cancer Mother        gastric tumor   Glaucoma Mother    Hyperlipidemia Mother    COPD Mother    Stomach cancer Mother    Esophageal cancer Father        mets to lung and liver   Hypertension Maternal Aunt    Colon cancer Neg Hx    Colon polyps Neg Hx    Rectal cancer Neg Hx    Ulcerative colitis Neg Hx     SOCIAL HISTORY: Social History   Socioeconomic History   Marital status: Divorced    Spouse name: Not on file   Number of children: 2   Years of education: Not on file   Highest education level: Not on file  Occupational  History   Occupation: Disabled  Tobacco Use   Smoking status: Never   Smokeless tobacco: Never  Vaping Use   Vaping status: Never Used  Substance and Sexual Activity   Alcohol use: No    Alcohol/week: 0.0 standard drinks of alcohol   Drug use: No   Sexual activity: Yes    Birth control/protection: Post-menopausal  Other Topics Concern   Not on file  Social History Narrative   Regular exercise:  NoCaffeine Use:  Occasional sweet teaShe is legally separated2 children ages 86 year old son and 51 year old daughter.  Both children.Previously worked in Engineering geologist, now disabled.Completed 2 yrs of college.        Are you right handed or left handed? Right Handed    Are you currently employed ? No    What is your current occupation? Disabled    Do you live at home alone? No   Who lives with you? Daughter   What type of home do you live in: 1 story or 2 story? Lives in a two story home.       Social Drivers of Corporate investment banker Strain: Not on file  Food Insecurity: Low  Risk  (05/30/2023)   Received from [facility]   Hunger Vital Sign    Worried About Running Out of Food in the Last Year: Never true    Ran Out of Food in the Last Year: Never true  Transportation Needs: No Transportation Needs (05/30/2023)   Received from [facility]    In the past 12 months, has lack of reliable transportation kept you from medical appointments, meetings, work or from getting things needed for daily living? : No  Physical Activity: Not on file  Stress: Not on file  Social Connections: Not on file  Intimate Partner Violence: Not on file    No Known Allergies  Current Outpatient Medications  Medication Sig Dispense Refill   Acetaminophen 500 MG capsule  TAKE 2 TABLETS BY MOUTH EVERY 6 HOURS FOR 7 DAYS     amLODipine (NORVASC) 2.5 MG tablet Take 1 tablet (2.5 mg total) by mouth every evening. 90 tablet 3   Cyanocobalamin 2500 MCG SUBL Once a month     dicyclomine  (BENTYL) 20 MG tablet TAKE 1 TABLET(20 MG) BY MOUTH FOUR TIMES DAILY BEFORE MEALS AND AT BEDTIME 120 tablet 5   escitalopram (LEXAPRO) 10 MG tablet Take 10 mg by mouth daily.     hyoscyamine (LEVSIN SL) 0.125 MG SL tablet DISSOLVE 1 TO 2 TABLETS UNDER THE TONGUE EVERY 4 HOURS AS NEEDED 120 tablet 5   losartan-hydrochlorothiazide (HYZAAR) 50-12.5 MG tablet TAKE 1 TABLET BY MOUTH DAILY 90 tablet 3   meclizine (ANTIVERT) 25 MG tablet Take 25 mg by mouth at bedtime.     methocarbamol (ROBAXIN) 750 MG tablet Take 750 mg by mouth 2 (two) times daily as needed for muscle spasms.     nitroGLYCERIN (NITROSTAT) 0.4 MG SL tablet Place 1 tablet (0.4 mg total) under the tongue every 5 (five) minutes as needed for chest pain. If you require more than two tablets five minutes apart go to the nearest ER via EMS. 30 tablet 0   pantoprazole (PROTONIX) 40 MG tablet TAKE 1 TABLET BY MOUTH TWICE DAILY 60 tablet 5   traMADol (ULTRAM) 50 MG tablet Take 50 mg by mouth 3 (three) times daily as needed for moderate pain.      No current facility-administered medications for this visit.    REVIEW OF SYSTEMS:  [X]  denotes positive finding, [ ]  denotes negative finding Cardiac  Comments:  Chest pain or chest pressure: ***   Shortness of breath upon exertion:    Short of breath when lying flat:    Irregular heart rhythm:        Vascular    Pain in calf, thigh, or hip brought on by ambulation:    Pain in feet at night that wakes you up from your sleep:     Blood clot in your veins:    Leg swelling:         Pulmonary    Oxygen at home:    Productive cough:     Wheezing:         Neurologic    Sudden weakness in arms or legs:     Sudden numbness in arms or legs:     Sudden onset of difficulty speaking or slurred speech:    Temporary loss of vision in one eye:     Problems with dizziness:         Gastrointestinal    Blood in stool:     Vomited blood:  Genitourinary    Burning when urinating:      Blood in urine:        Psychiatric    Major depression:         Hematologic    Bleeding problems:    Problems with blood clotting too easily:        Skin    Rashes or ulcers:        Constitutional    Fever or chills:      PHYSICAL EXAM: There were no vitals filed for this visit.  GENERAL: The patient is a well-nourished female, in no acute distress. The vital signs are documented above. CARDIAC: There is a regular rate and rhythm.  VASCULAR: *** PULMONARY: There is good air exchange bilaterally without wheezing or rales. ABDOMEN: Soft and non-tender with normal pitched bowel sounds.  MUSCULOSKELETAL: There are no major deformities or cyanosis. NEUROLOGIC: No focal weakness or paresthesias are detected. SKIN: There are no ulcers or rashes noted. PSYCHIATRIC: The patient has a normal affect.  DATA:   UPPER EXTREMITY DOPPLER STUDY   Patient Name:  DERA VANAKEN  Date of Exam:   06/08/2023  Medical Rec #: [id number]        Accession #:    [id number]  Date of Birth: [birth year]         Patient Gender: F  Patient Age:   34 years  Exam Location:  [facility]  Procedure:      VAS UE DOPPLER BILAT/COMP TOS, DIGITS (TO&UE REYNAUDS)  Referring Phys: Tessa Lerner    ---------------------------------------------------------------------------  -----    Indications: Bilateral upper extremity digit numbness/discoloration,  Raynaud's              phenomenon.   Risk Factors: Hypertension.   Performing Technologist: Criss Rosales RVT     Examination Guidelines: A complete evaluation includes B-mode imaging,  spectral  Doppler, color Doppler, and power Doppler as needed of all accessible  portions  of each vessel. Bilateral testing is considered an integral part of a  complete  examination. Limited examinations for reoccurring indications may be  performed  as noted.     Right Doppler Findings:  +--------+--------+-----+---------+--------+  Site    PressureIndexDoppler  Comments  +--------+--------+-----+---------+--------+  [id number]         triphasic          +--------+--------+-----+---------+--------+  Radial 138     1.14 triphasic          +--------+--------+-----+---------+--------+  Ulnar  137     1.13 triphasic          +--------+--------+-----+---------+--------+  Digit  105     0.87 Normal             +--------+--------+-----+---------+--------+    +---+----+   WBI1.14   +---+----+   Left Doppler Findings:  +--------+--------+-----+---------+--------+  Site   PressureIndexDoppler  Comments  +--------+--------+-----+---------+--------+  [id number]                            +--------+--------+-----+---------+--------+  Radial 130     1.07 triphasic          +--------+--------+-----+---------+--------+  Ulnar  128     1.06 triphasic          +--------+--------+-----+---------+--------+  Digit  116     0.96 Normal             +--------+--------+-----+---------+--------+    +---+----+   WBI1.07   +---+----+  Summary:    Raynaud's Waveforms:  3 minute cold immersion  - 1 minute: right fingertips discomfort  - 2 minutes: increased right discomfort, left approaching discomfort    - 2:45 minutes: more discomfort, ready to stop    Waveform PPG Sequence  1. Baseline/Rest  2. Immediate post immersion  3. Three minutes rest    Right: - No significant arterial obstruction detected in the right         upper extremity.         Normal wrist brachial index and digit brachial index.         - Resting PPG waveforms appear normal.           - Post cold immersion testing, PPG waveforms are         significantly dampened with decreased amplitudes throughout         all five digits.  Left: - No significant arterial obstruction detected in the left        upper extremity. Normal wrist brachial index and digit        brachial index        -  Resting PPG waveforms appear normal.        - Post cold immersion testing, PPG waveforms are significantly        dampened with decreased amplitudes throughout all five digits.  *See table(s) above for measurements and observations.     Electronically signed by Gerarda Fraction on 06/08/2023 at 4:41:33 PM.   Assessment/Plan:   58 y.o. female, with history of hypertension, sarcoid, GERD the presents for evaluation of management of Raynaud's.   Cephus Shelling, MD [facility] Office: [phone]

## 2023-07-25 ENCOUNTER — Encounter: Payer: Medicare HMO | Admitting: Vascular Surgery

## 2023-07-25 DIAGNOSIS — H9041 Sensorineural hearing loss, unilateral, right ear, with unrestricted hearing on the contralateral side: Secondary | ICD-10-CM | POA: Diagnosis not present

## 2023-07-26 ENCOUNTER — Ambulatory Visit (INDEPENDENT_AMBULATORY_CARE_PROVIDER_SITE_OTHER): Payer: Medicare HMO | Admitting: Vascular Surgery

## 2023-07-26 ENCOUNTER — Encounter: Payer: Self-pay | Admitting: Vascular Surgery

## 2023-07-26 VITALS — BP 120/78 | HR 88 | Temp 98.1°F | Resp 20 | Ht 63.0 in | Wt 201.0 lb

## 2023-07-26 DIAGNOSIS — I7300 Raynaud's syndrome without gangrene: Secondary | ICD-10-CM

## 2023-07-26 NOTE — Progress Notes (Signed)
Patient ID: [name], female   DOB: [birth year], 58 y.o.   MRN: [id number]  Reason for Consult: New Patient (Initial Visit)   Referred by Tessa Lerner, DO  Subjective:     HPI:  [name] is a 58 y.o. female with worsening pain in her bilateral fingers and toes particularly related to exposure to cold.  This has been present for many years but is worsening with age.  She has not had any tissue loss or ulceration.  It does limit her activities given that she has to avoid the cold or she has severe pain.  She states that she takes low-dose amlodipine once a day for blood pressure this has not helped with her hands.  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   Hypertension    Irritable bowel syndrome    Overweight(278.02)    Personal history of colonic polyps 09/19/2011   tubular adenoma   Sarcoidosis 03/20/2019   Sarcoidosis    Segmental colitis (HCC)    Tubular adenoma of colon    Family History  Problem Relation Age of Onset   Asthma Mother    Cancer Mother        gastric tumor   Glaucoma Mother    Hyperlipidemia Mother    COPD Mother    Stomach cancer Mother    Esophageal cancer Father        mets to lung and liver   Hypertension Maternal Aunt    Colon cancer Neg Hx    Colon polyps Neg Hx    Rectal cancer Neg Hx    Ulcerative colitis Neg Hx    Past Surgical History:  Procedure Laterality Date   COLONOSCOPY     FACET JOINT INJECTION  03/28/11   injections in neck   hysterectomy  2000   IR SACROPLASTY BILATERAL  06/18/2019   KNEE SURGERY Right     Right knee arthroscopic partial lateral meniscectomy (01/22/07),arthroscopic partial lateral meniscectomy 5/09   POLYPECTOMY     SPINE SURGERY  10/13/06   Posterior interbody fusion (PLIF) L5-S1 (07/04/07)   WRIST FRACTURE SURGERY Left 2011   Due to injury at work in 2008.    Short Social History:  Social History    Tobacco Use   Smoking status: Never   Smokeless tobacco: Never  Substance Use Topics   Alcohol use: No    Alcohol/week: 0.0 standard drinks of alcohol    No Known Allergies  Current Outpatient Medications  Medication Sig Dispense Refill   Acetaminophen 500 MG capsule  TAKE 2 TABLETS BY MOUTH EVERY 6 HOURS FOR 7 DAYS     amLODipine (NORVASC) 2.5 MG tablet Take 1 tablet (2.5 mg total) by mouth every evening. 90 tablet 3   Cyanocobalamin 2500 MCG SUBL Once a month     dicyclomine (BENTYL) 20 MG tablet TAKE 1 TABLET(20 MG) BY MOUTH FOUR TIMES DAILY BEFORE MEALS AND AT BEDTIME 120 tablet 5   escitalopram (LEXAPRO) 10 MG tablet Take 10 mg by mouth daily.     hyoscyamine (LEVSIN SL) 0.125 MG SL tablet DISSOLVE 1 TO 2 TABLETS UNDER THE TONGUE EVERY 4 HOURS AS NEEDED 120 tablet 5   meclizine (ANTIVERT) 25 MG tablet Take 25 mg by mouth at bedtime.     methocarbamol (ROBAXIN) 750 MG tablet Take 750 mg by mouth 2 (two) times daily as needed  for muscle spasms.     pantoprazole (PROTONIX) 40 MG tablet TAKE 1 TABLET BY MOUTH TWICE DAILY 60 tablet 5   traMADol (ULTRAM) 50 MG tablet Take 50 mg by mouth 3 (three) times daily as needed for moderate pain.      losartan-hydrochlorothiazide (HYZAAR) 50-12.5 MG tablet TAKE 1 TABLET BY MOUTH DAILY 90 tablet 3   nitroGLYCERIN (NITROSTAT) 0.4 MG SL tablet Place 1 tablet (0.4 mg total) under the tongue every 5 (five) minutes as needed for chest pain. If you require more than two tablets five minutes apart go to the nearest ER via EMS. 30 tablet 0   No current facility-administered medications for this visit.    Review of Systems  Constitutional:  Constitutional negative. HENT: HENT negative.  Eyes: Eyes negative.  Respiratory: Respiratory negative.  Cardiovascular: Cardiovascular negative.  GI: Gastrointestinal negative.  Musculoskeletal: Musculoskeletal negative.  Skin: Skin negative.  Neurological: Neurological negative. Hematologic:  Hematologic/lymphatic negative.  Psychiatric: Psychiatric negative.        Objective:  Objective   Vitals:   07/26/23 0909  BP: 120/78  Pulse: 88  Resp: 20  Temp: 98.1 F (36.7 C)  SpO2: 96%  Weight: 201 lb (91.2 kg)  Height: 5\' 3"  (1.6 m)   Body mass index is 35.61 kg/m.  Physical Exam HENT:     Head: Normocephalic.     Nose: Nose normal.  Eyes:     Pupils: Pupils are equal, round, and reactive to light.  Cardiovascular:     Rate and Rhythm: Normal rate.     Pulses: Normal pulses.  Pulmonary:     Effort: Pulmonary effort is normal.  Abdominal:     General: Abdomen is flat.  Musculoskeletal:     Cervical back: Normal range of motion and neck supple.     Right lower leg: No edema.     Left lower leg: No edema.  Skin:    General: Skin is warm.     Capillary Refill: Capillary refill takes less than 2 seconds.  Neurological:     General: No focal deficit present.     Mental Status: She is alert.  Psychiatric:        Mood and Affect: Mood normal.        Thought Content: Thought content normal.        Judgment: Judgment normal.     Data: Right Doppler Findings:  +--------+--------+-----+---------+--------+  Site   PressureIndexDoppler  Comments  +--------+--------+-----+---------+--------+  [id number]         triphasic          +--------+--------+-----+---------+--------+  Radial 138     1.14 triphasic          +--------+--------+-----+---------+--------+  Ulnar  137     1.13 triphasic          +--------+--------+-----+---------+--------+  Digit  105     0.87 Normal             +--------+--------+-----+---------+--------+    +---+----+   WBI1.14   +---+----+   Left Doppler Findings:  +--------+--------+-----+---------+--------+  Site   PressureIndexDoppler  Comments  +--------+--------+-----+---------+--------+  [id number]                            +--------+--------+-----+---------+--------+   Radial 130     1.07 triphasic          +--------+--------+-----+---------+--------+  Ulnar  128     1.06 triphasic          +--------+--------+-----+---------+--------+  Digit  116     0.96 Normal             +--------+--------+-----+---------+--------+    +---+----+   WBI1.07   +---+----+       Summary:    Raynaud's Waveforms:  3 minute cold immersion  - 1 minute: right fingertips discomfort  - 2 minutes: increased right discomfort, left approaching discomfort    - 2:45 minutes: more discomfort, ready to stop    Waveform PPG Sequence  1. Baseline/Rest  2. Immediate post immersion  3. Three minutes rest    Right: - No significant arterial obstruction detected in the right         upper extremity.         Normal wrist brachial index and digit brachial index.         - Resting PPG waveforms appear normal.           - Post cold immersion testing, PPG waveforms are         significantly dampened with decreased amplitudes throughout         all five digits.  Left: - No significant arterial obstruction detected in the left        upper extremity. Normal wrist brachial index and digit        brachial index        - Resting PPG waveforms appear normal.        - Post cold immersion testing, PPG waveforms are significantly        dampened with decreased amplitudes throughout all five digits.      Assessment/Plan:     58 year old female with history consistent with Raynaud's phenomenon.  I have recommended conservative measures with warming of her hands and preventing cold exposure.  We also discussed possibly increasing her calcium channel blocker dosage at the discretion of her primary care doctor.  All questions were answered today she can see me on an as-needed basis.    Maeola Harman MD [facility]

## 2023-08-02 DIAGNOSIS — R5382 Chronic fatigue, unspecified: Secondary | ICD-10-CM | POA: Diagnosis not present

## 2023-08-02 DIAGNOSIS — D649 Anemia, unspecified: Secondary | ICD-10-CM | POA: Diagnosis not present

## 2023-08-02 DIAGNOSIS — R2681 Unsteadiness on feet: Secondary | ICD-10-CM | POA: Diagnosis not present

## 2023-08-02 DIAGNOSIS — H8110 Benign paroxysmal vertigo, unspecified ear: Secondary | ICD-10-CM | POA: Diagnosis not present

## 2023-08-02 DIAGNOSIS — F321 Major depressive disorder, single episode, moderate: Secondary | ICD-10-CM | POA: Diagnosis not present

## 2023-08-02 DIAGNOSIS — E559 Vitamin D deficiency, unspecified: Secondary | ICD-10-CM | POA: Diagnosis not present

## 2023-08-02 DIAGNOSIS — E538 Deficiency of other specified B group vitamins: Secondary | ICD-10-CM | POA: Diagnosis not present

## 2023-08-02 DIAGNOSIS — M5030 Other cervical disc degeneration, unspecified cervical region: Secondary | ICD-10-CM | POA: Diagnosis not present

## 2023-08-02 DIAGNOSIS — R052 Subacute cough: Secondary | ICD-10-CM | POA: Diagnosis not present

## 2023-08-02 DIAGNOSIS — M25362 Other instability, left knee: Secondary | ICD-10-CM | POA: Diagnosis not present

## 2023-08-15 DIAGNOSIS — K1379 Other lesions of oral mucosa: Secondary | ICD-10-CM | POA: Diagnosis not present

## 2023-08-15 DIAGNOSIS — M51362 Other intervertebral disc degeneration, lumbar region with discogenic back pain and lower extremity pain: Secondary | ICD-10-CM | POA: Diagnosis not present

## 2023-08-15 DIAGNOSIS — M545 Low back pain, unspecified: Secondary | ICD-10-CM | POA: Diagnosis not present

## 2023-08-30 DIAGNOSIS — E538 Deficiency of other specified B group vitamins: Secondary | ICD-10-CM | POA: Diagnosis not present

## 2023-09-12 ENCOUNTER — Encounter (HOSPITAL_COMMUNITY): Payer: Self-pay

## 2023-09-21 DIAGNOSIS — M4802 Spinal stenosis, cervical region: Secondary | ICD-10-CM | POA: Diagnosis not present

## 2023-09-21 DIAGNOSIS — Z79899 Other long term (current) drug therapy: Secondary | ICD-10-CM | POA: Diagnosis not present

## 2023-09-21 DIAGNOSIS — R42 Dizziness and giddiness: Secondary | ICD-10-CM | POA: Diagnosis not present

## 2023-09-21 DIAGNOSIS — R2689 Other abnormalities of gait and mobility: Secondary | ICD-10-CM | POA: Diagnosis not present

## 2023-09-21 DIAGNOSIS — H9313 Tinnitus, bilateral: Secondary | ICD-10-CM | POA: Diagnosis not present

## 2023-09-21 DIAGNOSIS — M5021 Other cervical disc displacement,  high cervical region: Secondary | ICD-10-CM | POA: Diagnosis not present

## 2023-10-03 DIAGNOSIS — D509 Iron deficiency anemia, unspecified: Secondary | ICD-10-CM | POA: Diagnosis not present

## 2023-10-31 DIAGNOSIS — M51362 Other intervertebral disc degeneration, lumbar region with discogenic back pain and lower extremity pain: Secondary | ICD-10-CM | POA: Diagnosis not present

## 2023-10-31 DIAGNOSIS — D509 Iron deficiency anemia, unspecified: Secondary | ICD-10-CM | POA: Diagnosis not present

## 2023-10-31 DIAGNOSIS — E559 Vitamin D deficiency, unspecified: Secondary | ICD-10-CM | POA: Diagnosis not present

## 2023-10-31 DIAGNOSIS — F321 Major depressive disorder, single episode, moderate: Secondary | ICD-10-CM | POA: Diagnosis not present

## 2023-10-31 DIAGNOSIS — E669 Obesity, unspecified: Secondary | ICD-10-CM | POA: Diagnosis not present

## 2023-10-31 DIAGNOSIS — R42 Dizziness and giddiness: Secondary | ICD-10-CM | POA: Diagnosis not present

## 2023-10-31 DIAGNOSIS — R2681 Unsteadiness on feet: Secondary | ICD-10-CM | POA: Diagnosis not present

## 2023-10-31 DIAGNOSIS — E538 Deficiency of other specified B group vitamins: Secondary | ICD-10-CM | POA: Diagnosis not present

## 2023-10-31 DIAGNOSIS — I10 Essential (primary) hypertension: Secondary | ICD-10-CM | POA: Diagnosis not present

## 2023-11-02 DIAGNOSIS — M5412 Radiculopathy, cervical region: Secondary | ICD-10-CM | POA: Diagnosis not present

## 2023-11-07 DIAGNOSIS — N6313 Unspecified lump in the right breast, lower outer quadrant: Secondary | ICD-10-CM | POA: Diagnosis not present

## 2023-11-07 DIAGNOSIS — N644 Mastodynia: Secondary | ICD-10-CM | POA: Diagnosis not present

## 2023-11-07 DIAGNOSIS — N6311 Unspecified lump in the right breast, upper outer quadrant: Secondary | ICD-10-CM | POA: Diagnosis not present

## 2023-11-09 NOTE — Progress Notes (Unsigned)
 se     Brigitte Canard, PA-C [address] [place], [place]  [address] Phone: [phone]   Primary Care Physician: Victorio Grave, MD  Primary Gastroenterologist:  Brigitte Canard, PA-C / Darol Elizabeth, MD   Chief Complaint:  IDA, IBS-D, Epig Pain       HPI:   [name] is a 58 y.o. female, previous patient Dr. Sandrea Cruel, presents for annual follow-up of irritable bowel syndrome.  Also evaluate iron deficiency anemia.  Referred by Dr. Camilo Cella at [facility].  She last saw Dr. Sandrea Cruel 06/2022 for follow-up IBS-D, GERD, and granulomatous liver disease of uncertain etiology.  Currently taking hyoscyamine , dicyclomine , pantoprazole  40 Mg twice daily.  She has been followed by [facility].  10/03/23 lab: Hgb 10.4, MCV 87.3, total iron 39, iron saturation 16%. Baseline hemoglobin has been between 11 and 12g.  04/2023 last colonoscopy: Normal with no polyps.  10-year repeat.  08/2018 last EGD: Normal.  Current symptoms: She has some epigastric pain.  GERD is controlled on Pantoprazole .  Intermittent flare of IBS-D occurs once per month.  Associated symptoms include diarrhea, low abdominal cramping, nausea and vomiting.  She states Dicyclomine  and Hyosciamine help.  She denies NSAID use, or blood donation. Prior hysterectomy.  Denies melena, hematochezia, hematemesis, or other signs of bleeding.  Denies weight loss.   Current Outpatient Medications  Medication Sig Dispense Refill   Acetaminophen  500 MG capsule  TAKE 2 TABLETS BY MOUTH EVERY 6 HOURS FOR 7 DAYS     amLODipine  (NORVASC ) 2.5 MG tablet Take 1 tablet (2.5 mg total) by mouth every evening. 90 tablet 3   buPROPion (WELLBUTRIN XL) 150 MG 24 hr tablet Take 150 mg by mouth every morning.     Calcium Carb-Cholecalciferol (CALCIUM 600 + D PO) Take 1 tablet by mouth daily.     Cyanocobalamin  2500 MCG SUBL Once a month     dicyclomine  (BENTYL ) 20 MG tablet TAKE 1 TABLET(20 MG) BY MOUTH FOUR TIMES DAILY BEFORE MEALS AND AT BEDTIME  120 tablet 5   famotidine  (PEPCID ) 40 MG tablet Take 40 mg by mouth as needed.     FEROSUL 325 (65 Fe) MG tablet Take 325 mg by mouth daily.     hyoscyamine  (LEVSIN SL) 0.125 MG SL tablet DISSOLVE 1 TO 2 TABLETS UNDER THE TONGUE EVERY 4 HOURS AS NEEDED 120 tablet 5   losartan -hydrochlorothiazide (HYZAAR) 50-12.5 MG tablet TAKE 1 TABLET BY MOUTH DAILY 90 tablet 3   meclizine (ANTIVERT) 25 MG tablet Take 25 mg by mouth at bedtime.     methocarbamol (ROBAXIN) 750 MG tablet Take 750 mg by mouth 2 (two) times daily as needed for muscle spasms.     pantoprazole  (PROTONIX ) 40 MG tablet TAKE 1 TABLET BY MOUTH TWICE DAILY 60 tablet 5   traMADol (ULTRAM) 50 MG tablet Take 50 mg by mouth 3 (three) times daily as needed for moderate pain.      Vitamin D, Ergocalciferol, (DRISDOL) 1.25 MG (50000 UNIT) CAPS capsule Take 50,000 Units by mouth once a week.     buPROPion (WELLBUTRIN XL) 300 MG 24 hr tablet Take 300 mg by mouth every morning. (Patient not taking: Reported on 11/10/2023)     nitroGLYCERIN  (NITROSTAT ) 0.4 MG SL tablet Place 1 tablet (0.4 mg total) under the tongue every 5 (five) minutes as needed for chest pain. If you require more than two tablets five minutes apart go to the nearest ER via EMS. (Patient not taking: Reported on 11/10/2023)  30 tablet 0   No current facility-administered medications for this visit.    Allergies as of 11/10/2023   (No Known Allergies)    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   Hypertension    Irritable bowel syndrome    Overweight(278.02)    Personal history of colonic polyps 09/19/2011   tubular adenoma   Sarcoidosis 03/20/2019   Sarcoidosis    Segmental colitis (HCC)    Tubular adenoma of colon     Past Surgical History:  Procedure Laterality Date   COLONOSCOPY     FACET JOINT INJECTION  03/28/11   injections in neck   hysterectomy  2000   IR  SACROPLASTY BILATERAL  06/18/2019   KNEE SURGERY Right     Right knee arthroscopic partial lateral meniscectomy (01/22/07),arthroscopic partial lateral meniscectomy 5/09   POLYPECTOMY     SPINE SURGERY  10/13/06   Posterior interbody fusion (PLIF) L5-S1 (07/04/07)   WRIST FRACTURE SURGERY Left 2011   Due to injury at work in 2008.    Review of Systems:    All systems reviewed and negative except where noted in HPI.    Physical Exam:  BP 106/60 (BP Location: Left Arm, Patient Position: Sitting, Cuff Size: Large)   Pulse 76 Comment: irregular  Ht 5' 1.5" (1.562 m) Comment: height measured without shoes  Wt 197 lb 2 oz (89.4 kg)   LMP 02/25/2009   BMI 36.64 kg/m  Patient's last menstrual period was 02/25/2009.  General: Well-nourished, well-developed in no acute distress.  Lungs: Clear to auscultation bilaterally. Non-labored. Heart: Regular rate and rhythm, no murmurs rubs or gallops.  Abdomen: Bowel sounds are normal; Abdomen is Soft; No hepatosplenomegaly, masses or hernias;  Moderate Epigastric Abdominal Tenderness; No lower abdominal tenderness; No guarding or rebound tenderness. Neuro: Alert and oriented x 3.  Grossly intact.  Psych: Alert and cooperative, normal mood and affect.  Imaging Studies: No results found.  Labs: CBC    Component Value Date/Time   WBC 6.2 03/25/2020 1009   RBC 3.82 (L) 03/25/2020 1009   HGB 11.2 (L) 03/25/2020 1009   HCT 33.7 (L) 03/25/2020 1009   PLT 411.0 (H) 03/25/2020 1009   MCV 88.3 03/25/2020 1009   MCH 29.1 06/18/2019 0700   MCHC 33.3 03/25/2020 1009   RDW 12.9 03/25/2020 1009   LYMPHSABS 2.2 03/25/2020 1009   MONOABS 0.6 03/25/2020 1009   EOSABS 0.1 03/25/2020 1009   BASOSABS 0.0 03/25/2020 1009    CMP     Component Value Date/Time   NA 140 12/21/2022 1510   K 4.0 12/21/2022 1510   CL 104 12/21/2022 1510   CO2 21 12/21/2022 1510   GLUCOSE 94 12/21/2022 1510   GLUCOSE 88 03/25/2020 1009   BUN 12 12/21/2022 1510    CREATININE 0.88 12/21/2022 1510   CREATININE 0.63 08/12/2013 1428   CALCIUM 9.4 12/21/2022 1510   PROT 7.7 03/07/2019 1130   ALBUMIN 3.9 03/07/2019 1130   AST 12 03/07/2019 1130   ALT 9 03/07/2019 1130   ALKPHOS 94 03/07/2019 1130   BILITOT 0.4 03/07/2019 1130   GFRNONAA >60 01/18/2019 0930   GFRNONAA >90 04/13/2011 1011   GFRAA >60 01/18/2019 0930   GFRAA >90 04/13/2011 1011       Assessment and Plan:   [name] is a 58 y.o. y/o female presents for recurrent iron deficiency anemia  of uncertain etiology.  She had a normal/negative colonoscopy 04/2023.  I am scheduling repeat EGD to evaluate for upper GI sources of iron deficiency anemia.  It is possible she may have decreased iron absorption due to high-dose chronic PPI use.  1.  Iron deficiency anemia; Evaluate for Gastritis or PUD. - Labs: CBC, Iron Panal, Ferritin, Celiac Screen - Schedule repeat EGD. Scheduling EGD I discussed risks of EGD with patient to include risk of bleeding, perforation, and risk of sedation.  Patient expressed understanding and agrees to proceed with EGD.  - If EGD is unrevealing, then consider capsule endoscopy. - She had Normal colonoscopy 04/2023. - Continue ferrous sulfate 325 mg once daily.  Hold iron 5 days before EGD.  Consider IV iron infusion if she is not absorbing oral iron.  2.  Irritable bowel syndrome, Diarrhea predominant - Continue dicyclomine  and hyoscyamine  - Low FODMAP diet  3.  GERD - Controlled on PPI - Continue pantoprazole  40 Mg twice daily - Continue famotidine  40 Mg once daily - GERD diet   Brigitte Canard, PA-C  Follow up 4 weeks after EGD with TG.

## 2023-11-10 ENCOUNTER — Ambulatory Visit: Admitting: Physician Assistant

## 2023-11-10 ENCOUNTER — Other Ambulatory Visit (INDEPENDENT_AMBULATORY_CARE_PROVIDER_SITE_OTHER)

## 2023-11-10 ENCOUNTER — Encounter: Payer: Self-pay | Admitting: Physician Assistant

## 2023-11-10 VITALS — BP 106/60 | HR 76 | Ht 61.5 in | Wt 197.1 lb

## 2023-11-10 DIAGNOSIS — D509 Iron deficiency anemia, unspecified: Secondary | ICD-10-CM | POA: Diagnosis not present

## 2023-11-10 DIAGNOSIS — K219 Gastro-esophageal reflux disease without esophagitis: Secondary | ICD-10-CM

## 2023-11-10 DIAGNOSIS — K580 Irritable bowel syndrome with diarrhea: Secondary | ICD-10-CM | POA: Diagnosis not present

## 2023-11-10 LAB — CBC WITH DIFFERENTIAL/PLATELET
Basophils Absolute: 0.1 10*3/uL (ref 0.0–0.1)
Basophils Relative: 0.9 % (ref 0.0–3.0)
Eosinophils Absolute: 0.2 10*3/uL (ref 0.0–0.7)
Eosinophils Relative: 2.6 % (ref 0.0–5.0)
HCT: 32 % — ABNORMAL LOW (ref 36.0–46.0)
Hemoglobin: 10.8 g/dL — ABNORMAL LOW (ref 12.0–15.0)
Lymphocytes Relative: 27 % (ref 12.0–46.0)
Lymphs Abs: 2 10*3/uL (ref 0.7–4.0)
MCHC: 33.7 g/dL (ref 30.0–36.0)
MCV: 88.2 fl (ref 78.0–100.0)
Monocytes Absolute: 0.6 10*3/uL (ref 0.1–1.0)
Monocytes Relative: 8 % (ref 3.0–12.0)
Neutro Abs: 4.7 10*3/uL (ref 1.4–7.7)
Neutrophils Relative %: 61.5 % (ref 43.0–77.0)
Platelets: 474 10*3/uL — ABNORMAL HIGH (ref 150.0–400.0)
RBC: 3.63 Mil/uL — ABNORMAL LOW (ref 3.87–5.11)
RDW: 13.3 % (ref 11.5–15.5)
WBC: 7.6 10*3/uL (ref 4.0–10.5)

## 2023-11-10 NOTE — Patient Instructions (Addendum)
 Your provider has requested that you go to the basement level for lab work before leaving today. Press "B" on the elevator. The lab is located at the first door on the left as you exit the elevator.  You have been scheduled for an endoscopy. Please follow written instructions given to you at your visit today.  If you use inhalers (even only as needed), please bring them with you on the day of your procedure.  If you take any of the following medications, they will need to be adjusted prior to your procedure:   DO NOT TAKE 7 DAYS PRIOR TO TEST- Trulicity (dulaglutide) Ozempic, Wegovy (semaglutide) Mounjaro (tirzepatide) Bydureon Bcise (exanatide extended release)  DO NOT TAKE 1 DAY PRIOR TO YOUR TEST Rybelsus (semaglutide) Adlyxin (lixisenatide) Victoza (liraglutide) Byetta (exanatide) ___________________________________________________________________________  Please follow up sooner if symptoms increase or worsen  Due to recent changes in healthcare laws, you may see the results of your imaging and laboratory studies on MyChart before your provider has had a chance to review them.  We understand that in some cases there may be results that are confusing or concerning to you. Not all laboratory results come back in the same time frame and the provider may be waiting for multiple results in order to interpret others.  Please give us  48 hours in order for your provider to thoroughly review all the results before contacting the office for clarification of your results.   _______________________________________________________  If your blood pressure at your visit was 140/90 or greater, please contact your primary care physician to follow up on this.  _______________________________________________________  If you are age 56 or older, your body mass index should be between 23-30. Your Body mass index is 36.64 kg/m. If this is out of the aforementioned range listed, please consider follow  up with your Primary Care Provider.  If you are age 56 or younger, your body mass index should be between 19-25. Your Body mass index is 36.64 kg/m. If this is out of the aformentioned range listed, please consider follow up with your Primary Care Provider.   ________________________________________________________  The [facility] providers would like to encourage you to use MYCHART to communicate with providers for non-urgent requests or questions.  Due to long hold times on the telephone, sending your provider a message by [facility] may be a faster and more efficient way to get a response.  Please allow 48 business hours for a response.  Please remember that this is for non-urgent requests.  _______________________________________________________ Thank you for trusting me with your gastrointestinal care!   Brigitte Canard PA

## 2023-11-11 LAB — TISSUE TRANSGLUTAMINASE ABS,IGG,IGA
(tTG) Ab, IgA: <1 U/mL
(tTG) Ab, IgG: <1 U/mL

## 2023-11-11 LAB — IGA: Immunoglobulin A: 333 mg/dL — ABNORMAL HIGH (ref 47–310)

## 2023-11-13 ENCOUNTER — Ambulatory Visit: Payer: Self-pay | Admitting: Physician Assistant

## 2023-11-14 LAB — IBC + FERRITIN
Ferritin: 109.6 ng/mL (ref 10.0–291.0)
Iron: 48 ug/dL (ref 42–145)
Saturation Ratios: 16 % — ABNORMAL LOW (ref 20.0–50.0)
TIBC: 299.6 ug/dL (ref 250.0–450.0)
Transferrin: 214 mg/dL (ref 212.0–360.0)

## 2023-11-17 DIAGNOSIS — M533 Sacrococcygeal disorders, not elsewhere classified: Secondary | ICD-10-CM | POA: Diagnosis not present

## 2023-11-26 NOTE — Progress Notes (Signed)
 Agree with the assessment and plan as outlined by Brigitte Canard, PA-C.

## 2023-12-01 DIAGNOSIS — E538 Deficiency of other specified B group vitamins: Secondary | ICD-10-CM | POA: Diagnosis not present

## 2023-12-05 DIAGNOSIS — B370 Candidal stomatitis: Secondary | ICD-10-CM | POA: Diagnosis not present

## 2023-12-14 ENCOUNTER — Encounter: Payer: Self-pay | Admitting: Gastroenterology

## 2023-12-19 DIAGNOSIS — M545 Low back pain, unspecified: Secondary | ICD-10-CM | POA: Diagnosis not present

## 2023-12-19 DIAGNOSIS — M533 Sacrococcygeal disorders, not elsewhere classified: Secondary | ICD-10-CM | POA: Diagnosis not present

## 2023-12-22 ENCOUNTER — Encounter: Payer: Self-pay | Admitting: Gastroenterology

## 2023-12-22 ENCOUNTER — Ambulatory Visit (AMBULATORY_SURGERY_CENTER): Admitting: Gastroenterology

## 2023-12-22 ENCOUNTER — Encounter (HOSPITAL_COMMUNITY): Payer: Self-pay | Admitting: Interventional Radiology

## 2023-12-22 VITALS — BP 120/69 | HR 76 | Temp 98.1°F | Resp 16 | Ht 61.5 in | Wt 197.0 lb

## 2023-12-22 DIAGNOSIS — D509 Iron deficiency anemia, unspecified: Secondary | ICD-10-CM | POA: Diagnosis not present

## 2023-12-22 DIAGNOSIS — K319 Disease of stomach and duodenum, unspecified: Secondary | ICD-10-CM

## 2023-12-22 DIAGNOSIS — I10 Essential (primary) hypertension: Secondary | ICD-10-CM | POA: Diagnosis not present

## 2023-12-22 DIAGNOSIS — K317 Polyp of stomach and duodenum: Secondary | ICD-10-CM | POA: Diagnosis not present

## 2023-12-22 MED ORDER — SODIUM CHLORIDE 0.9 % IV SOLN: 500.0000 mL | Freq: Once | INTRAVENOUS | Status: DC

## 2023-12-22 NOTE — Progress Notes (Signed)
 [facility] History and Physical   Primary Care Physician:  Teresa Channel, MD   Reason for Procedure:   Iron deficiency anemia  Plan:    EGD     HPI: [name] is a 58 y.o. female undergoing EGD to evaluate iron deficiency anemia.  She has IBS_D and GERD.  GERD symptoms controlled on PPI and famotidine .  She does have epigastric pain.  Colonoscopy in Nov 2024 without GI source of blood loss.  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   Hypertension    Irritable bowel syndrome    Overweight(278.02)    Personal history of colonic polyps 09/19/2011   tubular adenoma   Sarcoidosis 03/20/2019   Sarcoidosis    Segmental colitis (HCC)    Tubular adenoma of colon     Past Surgical History:  Procedure Laterality Date   COLONOSCOPY     FACET JOINT INJECTION  03/28/11   injections in neck   hysterectomy  2000   IR SACROPLASTY BILATERAL  06/18/2019   KNEE SURGERY Right     Right knee arthroscopic partial lateral meniscectomy (01/22/07),arthroscopic partial lateral meniscectomy 5/09   POLYPECTOMY     SPINE SURGERY  10/13/06   Posterior interbody fusion (PLIF) L5-S1 (07/04/07)   WRIST FRACTURE SURGERY Left 2011   Due to injury at work in 2008.    Prior to Admission medications   Medication Sig Start Date End Date Taking? Authorizing Provider  Acetaminophen  500 MG capsule  TAKE 2 TABLETS BY MOUTH EVERY 6 HOURS FOR 7 DAYS 11/07/19   [provider]  amLODipine  (NORVASC ) 2.5 MG tablet Take 1 tablet (2.5 mg total) by mouth every evening. 06/19/23   Tolia, Sunit, DO  buPROPion (WELLBUTRIN XL) 150 MG 24 hr tablet Take 150 mg by mouth every morning. 10/24/23   [provider]  buPROPion (WELLBUTRIN XL) 300 MG 24 hr tablet Take 300 mg by mouth every morning. Patient not taking: Reported on 11/10/2023 10/31/23   [provider]  Calcium  Carb-Cholecalciferol (CALCIUM 600 + D PO) Take 1 tablet by mouth daily.    [provider]  Cyanocobalamin  2500 MCG SUBL Once a month    [provider]  dicyclomine  (BENTYL ) 20 MG tablet TAKE 1 TABLET(20 MG) BY MOUTH FOUR TIMES DAILY BEFORE MEALS AND AT BEDTIME 07/13/23   Avram Lupita BRAVO, MD  famotidine  (PEPCID ) 40 MG tablet Take 40 mg by mouth as needed. 09/08/23   [provider]  FEROSUL 325 (65 Fe) MG tablet Take 325 mg by mouth daily. 08/03/23   [provider]  hyoscyamine  (LEVSIN  SL) 0.125 MG SL tablet DISSOLVE 1 TO 2 TABLETS UNDER THE TONGUE EVERY 4 HOURS AS NEEDED 07/13/23   Avram Lupita BRAVO, MD  losartan -hydrochlorothiazide (HYZAAR) 50-12.5 MG tablet TAKE 1 TABLET BY MOUTH DAILY 06/13/23 11/10/23  Tolia, Sunit, DO  meclizine (ANTIVERT) 25 MG tablet Take 25 mg by mouth at bedtime. 11/24/22   [provider]  methocarbamol (ROBAXIN) 750 MG tablet Take 750 mg by mouth 2 (two) times daily as needed for muscle spasms.    [provider]  nitroGLYCERIN  (NITROSTAT ) 0.4 MG SL tablet Place 1 tablet (0.4 mg total) under the tongue every 5 (five) minutes as needed for chest pain. If you require more than two tablets five minutes apart go to the nearest ER via EMS. Patient not taking: Reported  on 11/10/2023 12/08/22 11/10/23  Tolia, Sunit, DO  pantoprazole  (PROTONIX ) 40 MG tablet TAKE 1 TABLET BY MOUTH TWICE DAILY 07/13/23   Avram Lupita BRAVO, MD  traMADol (ULTRAM) 50 MG tablet Take 50 mg by mouth 3 (three) times daily as needed for moderate pain.     [provider]  Vitamin D, Ergocalciferol, (DRISDOL) 1.25 MG (50000 UNIT) CAPS capsule Take 50,000 Units by mouth once a week. 10/31/23   [provider]  omeprazole (PRILOSEC) 20 MG capsule Take 20 mg by mouth daily.  09/08/11  [provider]    Current Outpatient Medications  Medication Sig Dispense Refill   Acetaminophen  500 MG capsule  TAKE 2 TABLETS BY MOUTH EVERY 6 HOURS FOR 7  DAYS     amLODipine  (NORVASC ) 2.5 MG tablet Take 1 tablet (2.5 mg total) by mouth every evening. 90 tablet 3   buPROPion (WELLBUTRIN XL) 150 MG 24 hr tablet Take 150 mg by mouth every morning.     buPROPion (WELLBUTRIN XL) 300 MG 24 hr tablet Take 300 mg by mouth every morning. (Patient not taking: Reported on 11/10/2023)     Calcium Carb-Cholecalciferol (CALCIUM 600 + D PO) Take 1 tablet by mouth daily.     Cyanocobalamin  2500 MCG SUBL Once a month     dicyclomine  (BENTYL ) 20 MG tablet TAKE 1 TABLET(20 MG) BY MOUTH FOUR TIMES DAILY BEFORE MEALS AND AT BEDTIME 120 tablet 5   famotidine  (PEPCID ) 40 MG tablet Take 40 mg by mouth as needed.     FEROSUL 325 (65 Fe) MG tablet Take 325 mg by mouth daily.     hyoscyamine  (LEVSIN  SL) 0.125 MG SL tablet DISSOLVE 1 TO 2 TABLETS UNDER THE TONGUE EVERY 4 HOURS AS NEEDED 120 tablet 5   losartan -hydrochlorothiazide (HYZAAR) 50-12.5 MG tablet TAKE 1 TABLET BY MOUTH DAILY 90 tablet 3   meclizine (ANTIVERT) 25 MG tablet Take 25 mg by mouth at bedtime.     methocarbamol (ROBAXIN) 750 MG tablet Take 750 mg by mouth 2 (two) times daily as needed for muscle spasms.     nitroGLYCERIN  (NITROSTAT ) 0.4 MG SL tablet Place 1 tablet (0.4 mg total) under the tongue every 5 (five) minutes as needed for chest pain. If you require more than two tablets five minutes apart go to the nearest ER via EMS. (Patient not taking: Reported on 11/10/2023) 30 tablet 0   pantoprazole  (PROTONIX ) 40 MG tablet TAKE 1 TABLET BY MOUTH TWICE DAILY 60 tablet 5   traMADol (ULTRAM) 50 MG tablet Take 50 mg by mouth 3 (three) times daily as needed for moderate pain.      Vitamin D, Ergocalciferol, (DRISDOL) 1.25 MG (50000 UNIT) CAPS capsule Take 50,000 Units by mouth once a week.     No current facility-administered medications for this visit.    Allergies as of 12/22/2023   (Not on File)    Family History  Problem Relation Age of Onset   Asthma Mother    Cancer Mother        gastric tumor    Glaucoma Mother    Hyperlipidemia Mother    COPD Mother    Stomach cancer Mother    Esophageal cancer Father        mets to lung and liver   Hypertension Maternal Aunt    Colon cancer Neg Hx    Colon polyps Neg Hx    Rectal cancer Neg Hx    Ulcerative colitis Neg Hx     Social  History   Socioeconomic History   Marital status: Divorced    Spouse name: Not on file   Number of children: 2   Years of education: Not on file   Highest education level: Not on file  Occupational History   Occupation: Disabled  Tobacco Use   Smoking status: Never   Smokeless tobacco: Never  Vaping Use   Vaping status: Never Used  Substance and Sexual Activity   Alcohol use: No    Alcohol/week: 0.0 standard drinks of alcohol   Drug use: No   Sexual activity: Yes    Birth control/protection: Post-menopausal  Other Topics Concern   Not on file  Social History Narrative   Regular exercise:  NoCaffeine Use:  Occasional sweet teaShe is legally separated2 children ages 47 year old son and 36 year old daughter.  Both children.Previously worked in Engineering geologist, now disabled.Completed 2 yrs of college.        Are you right handed or left handed? Right Handed    Are you currently employed ? No    What is your current occupation? Disabled    Do you live at home alone? No   Who lives with you? Daughter   What type of home do you live in: 1 story or 2 story? Lives in a two story home.       Social Drivers of Corporate investment banker Strain: Not on file  Food Insecurity: Low Risk  (05/30/2023)   Received from [facility]   Hunger Vital Sign    Within the past 12 months, you worried that your food would run out before you got money to buy more: Never true    Within the past 12 months, the food you bought just didn't last and you didn't have money to get more. : Never true  Transportation Needs: No Transportation Needs (05/30/2023)   Received from [facility]    In the past 12 months,  has lack of reliable transportation kept you from medical appointments, meetings, work or from getting things needed for daily living? : No  Physical Activity: Not on file  Stress: Not on file  Social Connections: Not on file  Intimate Partner Violence: Not on file    Review of Systems:  All other review of systems negative except as mentioned in the HPI.  Physical Exam: Vital signs BP 126/74   Pulse 84   Temp 98.1 F (36.7 C) (Temporal)   Resp (!) 22   Ht 5' 1.5 (1.562 m)   Wt 197 lb (89.4 kg)   LMP 02/25/2009   SpO2 100%   BMI 36.62 kg/m   General:   Alert,  Well-developed, well-nourished, pleasant and cooperative in NAD Airway:  Mallampati 2 Lungs:  Clear throughout to auscultation.   Heart:  Regular rate and rhythm; no murmurs, clicks, rubs,  or gallops. Abdomen:  Soft, nontender and nondistended. Normal bowel sounds.   Neuro/Psych:  Normal mood and affect. A and O x 3   Haidyn Kilburg E. Stacia, MD [facility]

## 2023-12-22 NOTE — Progress Notes (Signed)
 Called to room to assist during endoscopic procedure.  Patient ID and intended procedure confirmed with present staff. Received instructions for my participation in the procedure from the performing physician.

## 2023-12-22 NOTE — Op Note (Signed)
 [facility] Patient Name: [name] Procedure Date: 12/22/2023 9:00 AM MRN: [id number] Endoscopist: Glendia E. Stacia , MD, [id number] Age: 58 Referring MD:  Date of Birth: Jun 17, 1966 Gender: Female Account #: [id number] Procedure:                Upper GI endoscopy Indications:              Iron deficiency anemia Medicines:                Monitored Anesthesia Care Procedure:                Pre-Anesthesia Assessment:                           - Prior to the procedure, a History and Physical                            was performed, and patient medications and                            allergies were reviewed. The patient's tolerance of                            previous anesthesia was also reviewed. The risks                            and benefits of the procedure and the sedation                            options and risks were discussed with the patient.                            All questions were answered, and informed consent                            was obtained. Prior Anticoagulants: The patient has                            taken no anticoagulant or antiplatelet agents. ASA                            Grade Assessment: III - A patient with severe                            systemic disease. After reviewing the risks and                            benefits, the patient was deemed in satisfactory                            condition to undergo the procedure.                           After obtaining informed consent, the endoscope was  passed under direct vision. Throughout the                            procedure, the patient's blood pressure, pulse, and                            oxygen saturations were monitored continuously. The                            Olympus Scope [id number] was introduced through the                            mouth, and advanced to the second part of duodenum.                            The upper GI  endoscopy was accomplished without                            difficulty. The patient tolerated the procedure                            well. Scope In: Scope Out: Findings:                 The examined esophagus was normal.                           The entire examined stomach was normal. Biopsies                            were taken with a cold forceps for Helicobacter                            pylori testing. Estimated blood loss was minimal.                           Multiple 2 to 5 mm sessile polyps were found in the                            gastric fundus and in the gastric body. These                            polyps were removed with a cold biopsy forceps.                            Resection and retrieval were complete. Estimated                            blood loss was minimal.                           The examined duodenum was normal. Biopsies for                            histology were  taken with a cold forceps for                            evaluation of celiac disease. Estimated blood loss                            was minimal. Complications:            No immediate complications. Estimated Blood Loss:     Estimated blood loss was minimal. Impression:               - Normal esophagus.                           - Normal stomach. Biopsied.                           - Multiple gastric polyps. Resected and retrieved.                           - Normal examined duodenum. Biopsied.                           - No endoscopic abnormalities to explain iron                            deficiency anemia. Recommendation:           - Patient has a contact number available for                            emergencies. The signs and symptoms of potential                            delayed complications were discussed with the                            patient. Return to normal activities tomorrow.                            Written discharge instructions were provided to the                             patient.                           - Resume previous diet.                           - Continue present medications.                           - Await pathology results. Further recommendations                            based on biopsy results. Tatsuya Okray E. Stacia, MD 12/22/2023 9:47:08 AM This report has been signed electronically.

## 2023-12-22 NOTE — Progress Notes (Signed)
 Vitals-AH  Pt's states no medical or surgical changes since previsit or office visit.

## 2023-12-22 NOTE — Patient Instructions (Signed)
    Await pathology results on gastric polyps removed and gastric & duodenal biopsies done    Continue previous diet & medications   YOU HAD AN ENDOSCOPIC PROCEDURE TODAY AT THE [facility]:   Refer to the procedure report that was given to you for any specific questions about what was found during the examination.  If the procedure report does not answer your questions, please call your gastroenterologist to clarify.  If you requested that your care partner not be given the details of your procedure findings, then the procedure report has been included in a sealed envelope for you to review at your convenience later.  YOU SHOULD EXPECT: Some feelings of bloating in the abdomen. Passage of more gas than usual.  Walking can help get rid of the air that was put into your GI tract during the procedure and reduce the bloating. If you had a lower endoscopy (such as a colonoscopy or flexible sigmoidoscopy) you may notice spotting of blood in your stool or on the toilet paper. If you underwent a bowel prep for your procedure, you may not have a normal bowel movement for a few days.  Please Note:  You might notice some irritation and congestion in your nose or some drainage.  This is from the oxygen used during your procedure.  There is no need for concern and it should clear up in a day or so.  SYMPTOMS TO REPORT IMMEDIATELY:  Following upper endoscopy (EGD)  Vomiting of blood or coffee ground material  New chest pain or pain under the shoulder blades  Painful or persistently difficult swallowing  New shortness of breath  Fever of 100F or higher  Black, tarry-looking stools  For urgent or emergent issues, a gastroenterologist can be reached at any hour by calling [phone]. Do not use MyChart messaging for urgent concerns.    DIET:  We do recommend a small meal at first, but then you may proceed to your regular diet.  Drink plenty of fluids but you should avoid alcoholic  beverages for 24 hours.  ACTIVITY:  You should plan to take it easy for the rest of today and you should NOT DRIVE or use heavy machinery until tomorrow (because of the sedation medicines used during the test).    FOLLOW UP: Our staff will call the number listed on your records the next business day following your procedure.  We will call around 7:15- 8:00 am to check on you and address any questions or concerns that you may have regarding the information given to you following your procedure. If we do not reach you, we will leave a message.     If any biopsies were taken you will be contacted by phone or by letter within the next 1-3 weeks.  Please call us  at [phone] if you have not heard about the biopsies in 3 weeks.    SIGNATURES/CONFIDENTIALITY: You and/or your care partner have signed paperwork which will be entered into your electronic medical record.  These signatures attest to the fact that that the information above on your After Visit Summary has been reviewed and is understood.  Full responsibility of the confidentiality of this discharge information lies with you and/or your care-partner.

## 2023-12-22 NOTE — Progress Notes (Signed)
 Pt A/O x 3, gd SR's, pleased with anesthesia, report to RN

## 2023-12-25 ENCOUNTER — Telehealth: Payer: Self-pay

## 2023-12-25 NOTE — Telephone Encounter (Signed)
 Left message on follow up call.

## 2023-12-26 DIAGNOSIS — M533 Sacrococcygeal disorders, not elsewhere classified: Secondary | ICD-10-CM | POA: Diagnosis not present

## 2023-12-27 ENCOUNTER — Encounter: Payer: Self-pay | Admitting: Cardiology

## 2023-12-28 LAB — SURGICAL PATHOLOGY

## 2024-01-01 ENCOUNTER — Ambulatory Visit: Payer: Self-pay | Admitting: Gastroenterology

## 2024-01-01 ENCOUNTER — Other Ambulatory Visit: Payer: Self-pay | Admitting: Physical Medicine and Rehabilitation

## 2024-01-01 DIAGNOSIS — D509 Iron deficiency anemia, unspecified: Secondary | ICD-10-CM

## 2024-01-01 DIAGNOSIS — M7918 Myalgia, other site: Secondary | ICD-10-CM

## 2024-01-01 DIAGNOSIS — R102 Pelvic and perineal pain: Secondary | ICD-10-CM

## 2024-01-01 NOTE — Progress Notes (Signed)
 Ms. [name],  The biopsies taken from you duodenum were unremarkable, with no evidence of celiac disease The biopsies taken from your stomach were notable for mild reactive gastropathy which is a common finding and often related to use of certain medications (usually NSAIDs), but there was no evidence of Helicobacter pylori infection. This common finding is not felt to cause iron deficiency and there is no specific treatment or further evaluation recommended. The polyps resected from your stomach were benign fundic gland polyps.  There was no evidence of infection with Helicobacter pylori or intestinal metaplasia/dysplasia.  These types of polyps are typically secondary to acid suppression therapy and no specific follow-up required for these small, benign polyps  Your iron deficiency may be related to poor iron absorption related to your GERD medicines, pantoprazole  and famotidine  (iron is best absorbed in an acidic environment and those medicines reduce stomach acid and can cause impaired iron absorption).  I recommend you continue your iron tablet with a vitamin C tablet and plan to repeat an iron panel in 3 months.  Team, Please place order for CBC and iron panel in 3 months

## 2024-01-03 DIAGNOSIS — R413 Other amnesia: Secondary | ICD-10-CM | POA: Diagnosis not present

## 2024-01-03 DIAGNOSIS — E538 Deficiency of other specified B group vitamins: Secondary | ICD-10-CM | POA: Diagnosis not present

## 2024-01-03 DIAGNOSIS — E669 Obesity, unspecified: Secondary | ICD-10-CM | POA: Diagnosis not present

## 2024-01-03 DIAGNOSIS — D509 Iron deficiency anemia, unspecified: Secondary | ICD-10-CM | POA: Diagnosis not present

## 2024-01-03 DIAGNOSIS — I10 Essential (primary) hypertension: Secondary | ICD-10-CM | POA: Diagnosis not present

## 2024-01-03 DIAGNOSIS — F321 Major depressive disorder, single episode, moderate: Secondary | ICD-10-CM | POA: Diagnosis not present

## 2024-01-11 ENCOUNTER — Other Ambulatory Visit: Payer: Self-pay | Admitting: Internal Medicine

## 2024-01-20 ENCOUNTER — Ambulatory Visit
Admission: RE | Admit: 2024-01-20 | Discharge: 2024-01-20 | Disposition: A | Discharge status: Home / Self Care | Source: Ambulatory Visit | Attending: Physical Medicine and Rehabilitation | Admitting: Physical Medicine and Rehabilitation

## 2024-01-20 DIAGNOSIS — Z981 Arthrodesis status: Secondary | ICD-10-CM | POA: Diagnosis not present

## 2024-01-20 DIAGNOSIS — M47816 Spondylosis without myelopathy or radiculopathy, lumbar region: Secondary | ICD-10-CM | POA: Diagnosis not present

## 2024-01-20 DIAGNOSIS — M25551 Pain in right hip: Secondary | ICD-10-CM | POA: Diagnosis not present

## 2024-01-20 DIAGNOSIS — R102 Pelvic and perineal pain: Secondary | ICD-10-CM

## 2024-01-20 DIAGNOSIS — M7918 Myalgia, other site: Secondary | ICD-10-CM

## 2024-01-20 DIAGNOSIS — R531 Weakness: Secondary | ICD-10-CM | POA: Diagnosis not present

## 2024-01-25 DIAGNOSIS — F330 Major depressive disorder, recurrent, mild: Secondary | ICD-10-CM | POA: Diagnosis not present

## 2024-01-25 DIAGNOSIS — E669 Obesity, unspecified: Secondary | ICD-10-CM | POA: Diagnosis not present

## 2024-01-25 DIAGNOSIS — E785 Hyperlipidemia, unspecified: Secondary | ICD-10-CM | POA: Diagnosis not present

## 2024-01-25 DIAGNOSIS — M810 Age-related osteoporosis without current pathological fracture: Secondary | ICD-10-CM | POA: Diagnosis not present

## 2024-02-05 DIAGNOSIS — E538 Deficiency of other specified B group vitamins: Secondary | ICD-10-CM | POA: Diagnosis not present

## 2024-02-08 DIAGNOSIS — M533 Sacrococcygeal disorders, not elsewhere classified: Secondary | ICD-10-CM | POA: Diagnosis not present

## 2024-02-08 DIAGNOSIS — M545 Low back pain, unspecified: Secondary | ICD-10-CM | POA: Diagnosis not present

## 2024-02-29 DIAGNOSIS — M25531 Pain in right wrist: Secondary | ICD-10-CM | POA: Diagnosis not present

## 2024-02-29 DIAGNOSIS — R202 Paresthesia of skin: Secondary | ICD-10-CM | POA: Diagnosis not present

## 2024-02-29 DIAGNOSIS — R200 Anesthesia of skin: Secondary | ICD-10-CM | POA: Diagnosis not present

## 2024-03-05 ENCOUNTER — Encounter: Payer: Self-pay | Admitting: Neurology

## 2024-03-05 ENCOUNTER — Other Ambulatory Visit

## 2024-03-05 ENCOUNTER — Ambulatory Visit: Admitting: Neurology

## 2024-03-05 VITALS — BP 90/52 | HR 95 | Ht 61.5 in | Wt 196.0 lb

## 2024-03-05 DIAGNOSIS — R413 Other amnesia: Secondary | ICD-10-CM | POA: Diagnosis not present

## 2024-03-05 LAB — TSH: TSH: 0.5 m[IU]/L (ref 0.40–4.50)

## 2024-03-05 LAB — VITAMIN B12: Vitamin B-12: 470 pg/mL (ref 200–1100)

## 2024-03-05 NOTE — Progress Notes (Signed)
 Follow-up Visit   Date: 03/05/2024    [name] MRN: [id number] DOB: 19-Nov-1965    [name] is a 58 y.o. right-handed female with IBS, GERD, vitamin B12 deficiency, depression, lumbar fusion at L5-S1, chronic low back pain (followed by Dr. Ibazebo) returning to the clinic with new complaints of memory loss.  The patient was accompanied to the clinic by self.  IMPRESSION/PLAN: Cognitive impairment manifesting with memory loss and difficulty with concentration.  She scored 25/30 on MOCA (-1 visuospatial, -1 language, -1 concentration, -2 recall).  I think her symptoms are stemming from mood disorder and reassured her that dementia is not likely.  MRI brain from 2024 was reviewed and does not show any atrophy.  Her vitamin B12 has been low in the past (225), but she has been on injections for quite some time and has not noticed any benefit.  She is very concerned and would like additional testing.  Check TSH and vitamin B12. Formal neuropsychological testing.  --------------------------------------------- UPDATE 03/05/2024:  She is here with new complaints of memory change.  For the past 2-3 years, she feels sensation that her head is full, looses concentration, tends to repeat herself and asks the same questions.  She manages her own medication and occasionally forgets her medication, but has started to use a pillbox.  She manages her own finances and reports forgetting to pay her bills frequently.   She denies any problems with driving or getting lost.  She lives alone.     She reports having mood swings and takes Wellbutrin 300mg  for depression. She takes ultram 50mg  TID and robaxin 750mg  twice daily.    No family history of dementia.      Medications:  Current Outpatient Medications on File Prior to Visit  Medication Sig Dispense Refill   Acetaminophen  500 MG capsule  TAKE 2 TABLETS BY MOUTH EVERY 6 HOURS FOR 7 DAYS     amLODipine  (NORVASC ) 2.5 MG tablet Take 1 tablet  (2.5 mg total) by mouth every evening. 90 tablet 3   buPROPion (WELLBUTRIN XL) 300 MG 24 hr tablet Take 300 mg by mouth every morning.     Calcium Carb-Cholecalciferol (CALCIUM 600 + D PO) Take 1 tablet by mouth daily.     Cyanocobalamin  2500 MCG SUBL Once a month     dicyclomine  (BENTYL ) 20 MG tablet TAKE 1 TABLET(20 MG) BY MOUTH FOUR TIMES DAILY BEFORE MEALS AND AT BEDTIME 120 tablet 5   FEROSUL 325 (65 Fe) MG tablet Take 325 mg by mouth daily.     hyoscyamine  (LEVSIN  SL) 0.125 MG SL tablet DISSOLVE 1 TO 2 TABLETS UNDER THE TONGUE EVERY 4 HOURS AS NEEDED 120 tablet 5   losartan -hydrochlorothiazide (HYZAAR) 50-12.5 MG tablet TAKE 1 TABLET BY MOUTH DAILY 90 tablet 3   meclizine (ANTIVERT) 25 MG tablet Take 25 mg by mouth at bedtime.     methocarbamol (ROBAXIN) 750 MG tablet Take 750 mg by mouth 2 (two) times daily as needed for muscle spasms.     nitroGLYCERIN  (NITROSTAT ) 0.4 MG SL tablet Place 1 tablet (0.4 mg total) under the tongue every 5 (five) minutes as needed for chest pain. If you require more than two tablets five minutes apart go to the nearest ER via EMS. 30 tablet 0   pantoprazole  (PROTONIX ) 40 MG tablet TAKE 1 TABLET BY MOUTH TWICE DAILY 60 tablet 5   traMADol (ULTRAM) 50 MG tablet Take 50 mg by mouth 3 (three) times daily as needed  for moderate pain.      Vitamin D, Ergocalciferol, (DRISDOL) 1.25 MG (50000 UNIT) CAPS capsule Take 50,000 Units by mouth once a week.     famotidine  (PEPCID ) 40 MG tablet Take 40 mg by mouth as needed. (Patient not taking: Reported on 03/05/2024)     [DISCONTINUED] omeprazole (PRILOSEC) 20 MG capsule Take 20 mg by mouth daily.     No current facility-administered medications on file prior to visit.    Allergies: No Known Allergies  Vital Signs:  BP (!) 90/52   Pulse 95   Ht 5' 1.5 (1.562 m)   Wt 196 lb (88.9 kg)   LMP 02/25/2009   SpO2 97%   BMI 36.43 kg/m    Neurological Exam: MENTAL STATUS including orientation to time, place, person,  recent and remote memory, attention span and concentration, language, and fund of knowledge is normal.  Speech is not dysarthric.    03/05/2024   10:21 AM  Montreal Cognitive Assessment   Visuospatial/ Executive (0/5) 4  Naming (0/3) 3  Attention: Read list of digits (0/2) 2  Attention: Read list of letters (0/1) 1  Attention: Serial 7 subtraction starting at 100 (0/3) 2  Language: Repeat phrase (0/2) 1  Language : Fluency (0/1) 1  Abstraction (0/2) 2  Delayed Recall (0/5) 3  Orientation (0/6) 6  Total 25  Adjusted Score (based on education) 25    CRANIAL NERVES:  No visual field defects.  Pupils equal round and reactive to light.  Normal conjugate, extra-ocular eye movements in all directions of gaze.  No ptosis.  Face is symmetric. Palate elevates symmetrically.  Tongue is midline.  MOTOR:  Motor strength is 5/5 in all extremities.  No atrophy, fasciculations or abnormal movements.  No pronator drift.  Tone is normal.    MSRs:  Reflexes are 2+/4 throughout.  SENSORY:  Intact to vibration throughout.  COORDINATION/GAIT:  Normal finger-to- nose-finger.   Gait mildly wide-based, assisted with cane, stable.  Data: MRI cervical spine wwo contrast 02/24/2023: 1. At C4-C5, moderate to severe right foraminal stenosis and mild canal stenosis. 2. At C7-T1, mild-to-moderate right foraminal stenosis.   MRI brain wwo contrast 01/15/2023: 1. Normal MRI appearance of the brain. No acute or focal lesion to explain the patient's symptoms. 2. C3-4 disc protrusion with mild central canal stenosis. 3. Abnormal signal within the C3 vertebral body. This may be degenerative. Recommend MRI of the cervical spine without and with contrast for further evaluation. 4. Mild sphenoid sinus disease.   MRI cervical spine wwo contrast 04/30/2023: 1. Widespread chronic cervical spine degeneration with degenerative appearing marrow signal heterogeneity and enhancement, stable from August MRI. 2. Up to mild  spinal stenosis and mild cord mass effect at C4-C5 related to broad-based rightward posterior disc or disc osteophyte. No cervical spinal cord signal abnormality. 3. Degenerative moderate neural foraminal stenosis at the left C4, and moderate to severe at the right C5 nerve levels.   Thank you for allowing me to participate in patient's care.  If I can answer any additional questions, I would be pleased to do so.    Sincerely,    Vallen Calabrese K. Tobie, DO

## 2024-03-05 NOTE — Patient Instructions (Signed)
 Check labs  You have been referred for a neurocognitive evaluation (i.e., evaluation of memory and thinking abilities). Please bring someone with you to this appointment if possible, as it is helpful for the neuropsychologist to hear from both you and another adult who knows you well. Please bring eyeglasses and hearing aids if you wear them and take any medications as you normally would. Please fully abstain from all alcohol, marijuana, or other substances prior to your appointment.   The evaluation will take approximately 2-3 hours and has two parts:   The first part is a clinical interview with the neuropsychologist, Dr. Richie or Dr. Gayland. During the interview, the neuropsychologist will speak with you and the individual you brought to the appointment.    The second part of the evaluation is testing with the doctor's technician, aka psychometrician, Dana or [employer]. During the testing, the technician will ask you to remember different types of material, solve problems, and answer some questionnaires. Your family member will not be present for this portion of the evaluation.   Please note: We have to reserve several hours of the neuropsychologist's time and the psychometrician's time for your evaluation appointment. As such, there is a No-Show fee of $100. If you are unable to attend any of your appointments, please contact our office as soon as possible to reschedule.

## 2024-03-06 ENCOUNTER — Ambulatory Visit: Payer: Self-pay | Admitting: Neurology

## 2024-03-07 DIAGNOSIS — E538 Deficiency of other specified B group vitamins: Secondary | ICD-10-CM | POA: Diagnosis not present

## 2024-03-08 ENCOUNTER — Ambulatory Visit: Payer: Self-pay | Admitting: Psychology

## 2024-03-08 ENCOUNTER — Ambulatory Visit: Admitting: Psychology

## 2024-03-08 DIAGNOSIS — R419 Unspecified symptoms and signs involving cognitive functions and awareness: Secondary | ICD-10-CM | POA: Diagnosis not present

## 2024-03-08 DIAGNOSIS — R4189 Other symptoms and signs involving cognitive functions and awareness: Secondary | ICD-10-CM

## 2024-03-08 NOTE — Progress Notes (Signed)
   Psychometrician Note   Cognitive testing was administered to [name] by Lonell Jude, B.S. (psychometrist) under the supervision of Dr. Renda Beckwith, Psy.D., licensed psychologist on 03/08/2024. [name] did not appear overtly distressed by the testing session per behavioral observation or responses across self-report questionnaires. Rest breaks were offered.    The battery of tests administered was selected by Dr. Renda Beckwith, Psy.D. with consideration to [name]'s current level of functioning, the nature of her symptoms, emotional and behavioral responses during interview, level of literacy, observed level of motivation/effort, and the nature of the referral question. This battery was communicated to the psychometrist. Communication between Dr. Renda Beckwith, Psy.D. and the psychometrist was ongoing throughout the evaluation and Dr. Renda Beckwith, Psy.D. was immediately accessible at all times. Dr. Renda Beckwith, Psy.D. provided supervision to the psychometrist on the date of this service to the extent necessary to assure the quality of all services provided.    [name] will return within approximately 1-2 weeks for an interactive feedback session with Dr. Beckwith at which time her test performances, clinical impressions, and treatment recommendations will be reviewed in detail. [name] understands she can contact our office should she require our assistance before this time.  A total of 150 minutes of billable time were spent face-to-face with [name] by the psychometrist. This includes both test administration and scoring time. Billing for these services is reflected in the clinical report generated by Dr. Renda Beckwith, Psy.D.  This note reflects time spent with the psychometrician and does not include test scores or any clinical interpretations made by Dr. Beckwith. The full report will follow in a separate note.

## 2024-03-08 NOTE — Progress Notes (Signed)
 NEUROPSYCHOLOGICAL EVALUATION [place]. [facility]  [place] Department of Neurology  Date of Evaluation: 03/08/2024  REASON FOR REFERRAL   [name] is a 58 year old, right-handed, Black female with 14 years of formal education. She was referred for neuropsychological evaluation by her neurologist, Tonita Blanch, D.O., to assess current neurocognitive functioning, document potential cognitive deficits, and assist with treatment planning. This is her first neuropsychological evaluation.  SUMMARY OF RESULTS   Premorbid cognitive abilities are estimated to be in the low average range based on word reading and sociodemographic factors. Relative to this baseline estimate, performance today was largely within age-related expectations, with minimal exceptions.  Specifically, performance was below expectations on a single measure of attention/working memory, while all other measures in this domain were intact. On a novel problem-solving task, performance was largely preserved, though she demonstrated a slightly elevated number of nonperseverative errors compared to normative expectations. Learning/memory of a word list, short stories, and shapes was globally intact. The only exception was a relatively low number of recognition hits on the word list; however, because she did not make an elevated number of false positive errors, her overall recognition discriminability is considered adequate.  All other assessed domains--including processing speed, executive functioning, language, and visuospatial abilities--were intact.  On self-report questionnaires, she endorsed mild symptoms of depression and anxiety. She did not report excessive daytime sleepiness.  DIAGNOSTIC IMPRESSION   Results of the current evaluation indicated broadly normal test performance, with a few isolated weaknesses that did not suggest broader impairments within their respective domains. Despite occasional  forgetfulness with bills and medications, she continues to maintain functional independence. There is no evidence of a neurocognitive disorder at this time. Subjective cognitive concerns are likely multifactorial, related to normal aging, chronic pain, fatigue, ongoing mood symptoms, sleep disturbance, and possible medication side effects. To the degree that modifiable factors can be ameliorated, she may find that her subjective cognitive concerns improve.   Results further serve as a baseline for future comparison, should it ever become necessary to re-evaluate the patient.  ICD-10 Codes: R41.9 Cognitive concerns  RECOMMENDATIONS   In consultation with your doctor, schedule cognitive reevaluation on an as-needed basis to assess for cognitive decline and update treatment recommendations. Reevaluation should occur during a period of medical and affective stability.  Continue treatment for depression, especially given that emotional distress can exacerbate cognitive difficulties. Discuss current medication regimen with your prescribing provider to ensure you are receiving maximum benefit. Should your symptoms persist or worsen, it may be helpful to revisit counseling.  Continue to prioritize physical health through diet, exercise, and sleep: Regular physical activity--even at a modified level--can help reduce pain sensitivity, improve mood, and support overall function. For individuals with chronic pain, low-impact exercises such as gentle walking, aquatic therapy, chair yoga, or tai chi may be more sustainable and less likely to aggravate symptoms. Aim for consistency over intensity, with a goal of accumulating up to 150 minutes of low- to moderate-intensity activity per week as tolerated. A brain-healthy diet such as the Mediterranean or MIND diet is rich in fruits, vegetables, whole grains, healthy fats, and lean proteins, and has been associated with reduced risk of cognitive decline. Additionally,  getting adequate, quality sleep and managing chronic conditions with the help of healthcare providers are essential components of healthy aging.  Given ongoing sleep difficulties, she may benefit from the implementation of sleep hygiene techniques, including:  Go to bed and get up at the same time each day to help your  body establish a regular rhythm. Establish and maintain a bedtime routine. Certain activities such as stretching, meditating, listening to soft music, or reading ~15 minutes before bedtime can be a great way to regularly get your brain and body ready for sleep. Avoid taking naps during the day. Avoid caffeine for 5 or 6 hours before going to bed. Get regular exercise, but not in the hours before bedtime. Use comfortable bedding and maintain a cool temperature in your bedroom. Block out light and distracting noise. Avoid watching television or using your phone/computer in bed. Avoid staying in bed if you have difficulty falling asleep. If you have not been able to get to sleep after about 20 minutes or more, get up and do something calming or boring until you feel sleepy, then return to bed and try again.  Continue to stay socially and mentally engaged. Maintaining strong social connections and regularly stimulating your brain can help protect against cognitive decline. This includes staying connected with friends and family, volunteering, or participating in community groups. Mentally engaging activities--such as reading, doing puzzles, playing strategy games, or learning a new language or musical instrument--promote brain plasticity. If you are interested in activities to support cognitive engagement, this site offers a variety of apps and games organized by difficulty level:  https://www.[facility].org/get-to-know-barrow/centers-programs/neurorehabilitation-center/neuro-rehab-apps-and-games/  Consider implementing compensatory strategies to maximize independence and maintain daily  functioning. Examples include:  Adhere to routine. Compensatory strategies work best when they are used consistently. Use a planner, calendar, or white board that has the schedule and important events for the day clearly listed to reference and cross off when tasks are complete.  Ask for written information, especially if it is new or unfamiliar (e.g., information provided at a doctor's appointment).  Create an organized environment. Keep items that can be easily misplaced in a sensible location and get into the habit of always returning the items to those places. Pay attention and reduce distractions. Make a point of focusing attention on information you want to remember. One-on-one interaction is more likely to facilitate attention and minimize distraction. Make eye contact and repeat the information out loud after you hear it. Reduce interruptions or distractions especially when attempting to learn new information.  Create associations. When learning something new, think about and understand the information. Explain it in your own words or try to associate it with something you already know. Take notes to help remember important details. Evaluate goals and plan accordingly. When confronted by many different tasks, begin by making a list that prioritizes each task and estimates the time it will take to complete. Break down complicated tasks into smaller, more manageable steps. Focus on one task at a time and complete each task before starting another. Avoid multitasking.  DISPOSITION   No follow-up neuropsychological testing was scheduled at this time. Please feel free to refer the patient for repeated evaluation if she shows a significant change in neurocognitive status. She will be provided verbal feedback in approximately one week regarding the findings and impression during this visit.  The remainder of the report includes the details of the patient's background and a table of results from the  current evaluation, which support the summary and recommendations described above.  BACKGROUND   History of Presenting Illness: The following information was obtained from a review of medical records and an interview with the patient. Briefly, the patient initially established care with Dr. Tobie at [facility] in 2024 for evaluation of imbalance but was recently seen again on 03/05/2024 due  to memory concerns over the past two to three years. She described a sensation of her head feeling full, difficulty concentrating, repetitiveness. She independently manages her medications but occasionally forgets to take them; she has recently begun using a pillbox. She also manages her own finances but reported frequently forgetting to pay bills. MoCA=25/30. Cognitive concerns were thought to be possibly mood-related; however, she was referred for neuropsychological evaluation for further assessment.  Cognitive Functioning: During today's appointment, the patient reported cognitive concerns over the past year. She described difficulties with short-term memory and attention, including misplacing items, forgetting details of conversations after a delay, losing her train of thought mid-conversation, difficulty concentrating, forgetting appointments, and an increased need to take notes. She also endorsed slower processing speed, word-finding difficulties, and decreased efficiency with executive functions such as planning and problem-solving. She denied significant issues with navigation but noted that she has never been strong in this area and has long relied on GPS.  Physical Functioning: Patient reported difficulties with both sleep initiation and maintenance. When she does not sleep well at night, she may take naps during the day. She also endorsed low energy and chronic pain. Appetite is somewhat variable due to IBS, and she sometimes limits certain foods as a result. No changes in taste or smell were reported.  Vision and hearing are grossly stable. She reported balance problems of unclear origin; she generally ambulates with a cane. Although she has a history of falls, none have occurred recently. She denied experiencing any tremors.  Emotional Functioning: Patient described her recent mood as "wishy-washy" and reported a sensation of her head feeling full, with many thoughts occupying her mind (consistent with what was previously reported to neurology). She denied any suicidal ideation. Due to fatigue and chronic pain, she spends most of her time at home resting on the couch, though she occasionally socializes with friends.  Imaging: MRI of the brain (01/07/2023) was unremarkable.  Other Relevant Medical History: Remarkable for fibromyalgia, cervical disc disease, colitis, irritable bowel syndrome, and gastroesophageal reflux disease. Please refer to the medical record for a more comprehensive problem list. No history of stroke, CNS infection, head injury, or seizure was reported.  Current Medications: Per record, acetaminophen , amlodipine , bupropion, dicyclomine , hyoscyamine , losartan -hydrochlorothiazide, meclizine, methocarbamol, nitroglycerin , pantoprazole , tramadol, calcium and vitamin D3, iron, vitamin D2, vitamin B12.   Functional Status: Patient independently performs all basic and instrumental (e.g., driving finances, medications) activities of daily living. She previously had some difficulty remembering to take her medications, but since beginning to use a pillbox, she has been adherent. She continues to occasionally struggle with paying bills; however, this is not solely related to memory issues, as financial constraints also contribute.  Family Neurological History: Remarkable for Alzheimer's disease in the patient's paternal grandmother.  Psychiatric History: Remarkable for depression, currently managed with medication. She is not participating in counseling at this time but has done so in the  past. History of suicidal ideation, hallucinations, and psychiatric hospitalizations was not reported.  Substance Use History: Patient denied current use of alcohol, nicotine, marijuana, and illicit substances.  Social and Developmental History: Patient was born in [place], [place]. History of perinatal complications and developmental delays was not reported. She is divorced and lives alone. She has two children.   Educational and Occupational History: No history of childhood learning disability, special education services, or grade retention was reported. 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 She went on disability in 2009 primarily due to back issues following a significant fall and has not returned to work since.  BEHAVIORAL OBSERVATIONS   Patient arrived on time and was unaccompanied. She ambulated with a cane. She was alert and fully oriented. She was appropriately groomed and dressed for the setting. No significant sensory or motor abnormalities were observed. Vision (with glasses) and hearing were adequate for testing purposes. Speech was of normal rate, prosody, and volume. No conversational word-finding difficulties, paraphasic errors, or dysarthria were observed. Comprehension was conversationally intact. Thought processes were linear, logical, and coherent. Thought content was organized and devoid of delusions. Insight appeared appropriate. Affect was even and congruent with euthymic mood. She was cooperative and gave adequate effort during testing, including on standalone and embedded measures of performance validity. Results are thought to accurately reflect her cognitive functioning at this time.  NEUROPSYCHOLOGICAL TESTING RESULTS   Tests Administered: Animal Naming Test; Brief Visuospatial Memory Test-Revised (BVMT-R) - Form  1; [place]  Verbal Learning Test Third Edition (CVLT3) - Standard Form; Controlled Oral Word Association Test (COWAT): FAS; Epworth Sleepiness Scale (ESS); Geriatric Anxiety Scale-10 Item (GAS-10); Geriatric Depression Scale Short Form (GDS-SF); Judgment of Line Orientation (JLO) - Form H; Neuropsychological Assessment Battery (NAB) - Subtest(s): Naming Form 1; Standalone performance validity test (PVT); Test of Premorbid Functioning (TOPF); Trail Making Test (TMT); Wechsler Adult Intelligence Scale Fifth Edition (WAIS-5) - Subtest(s): Similarities, Clinical cytogeneticist, Matrix Reasoning, Digit Sequencing, Coding, Running Digits, Symbol Search, Symbol Span; Wechsler Memory Scale Fourth Edition (WMS-IV) - Subtest(s): Logical Memory (LM); and [place]  Card Sorting Test 64 Card Version (WCST-64).  Test results are provided in the table below. Whenever possible, the patient's scores were compared against age-, sex-, and education-corrected normative samples. Interpretive descriptions are based on the AACN consensus conference statement on uniform labeling (Guilmette et al., 2020).  PREMORBID FUNCTIONING RAW  RANGE  TOPF 26 StdS=85 Low Average  ATTENTION & WORKING MEMORY RAW  RANGE  WAIS-5 Digit Sequencing -- ss=10 Average  WAIS-5 Running Digits -- ss=4 Below Average  WAIS-5 Symbol Span -- ss=9 Average  PROCESSING SPEED RAW  RANGE  Trails A 25''0e T=57 High Average  WAIS-5 Coding  -- ss=9 Average  WAIS-5 Symbol Search -- ss=7 Low Average  EXECUTIVE FUNCTION RAW  RANGE  Trails B 67''0e T=58 High Average  WAIS-5 Matrix Reasoning -- ss=8 Average  WAIS-5 Similarities -- ss=9 Average  COWAT Letter Fluency 11+11+15 T=49 Average  WCST-64 Total Errors 22 T=41 Low Average  WCST-64 Perseverative Errors 9 T=47 Average  WCST-64 Nonperseverative Errors 13 T=35 Below Average  WCST-64 Categories Completed 3 >16%ile WNL  WCSR-64 FMS 1 -- --  LANGUAGE RAW  RANGE  COWAT Letter Fluency 11+11+15 T=49 Average  Animal  Naming Test 21 T=60 High Average  NAB Naming Test 31/31 T=55 WNL  VISUOSPATIAL RAW  RANGE  WAIS-5 Block Design -- ss=7 Low Average  JLO C/S=21/30 22%ile Low Average  BVMT-R Copy Trial 12/12 -- WNL  VERBAL LEARNING & MEMORY RAW  RANGE  CVLT3 Total 1-5 3,8,8,12,12 StdS=91 Average  CVLT3 Trial B 4/16 ss=8 Average  CVLT3 SDFR  10/16 ss=10 Average  CVLT3 [facility] 10/16 ss=8 Average  CVLT3 LDFR  9/16 ss=8 Average  CVLT3 LDCR  11/16 ss=9 Average  CVLT3 Recognition Hits 12 ss=5 Below Average  CVLT3 Recognition False+ 3 ss=9 Average  CVLT3 Discriminability -- ss=8 Average  CVLT3 Intrusions 3 ss=10 Average  CVLT3 Repetitions 1 ss=13 High Average  CVLT3 Forced Choice 16/16 -- WNL  WMS-IV LM-I  (11+10)/50 ss=8 Average  WMS-IV LM-II  (8+10)/50 ss=9 Average  WMS-IV LM Recognition  (13+14)/30 >75%ile High Average to Exceptionally High  VISUAL LEARNING & MEMORY RAW  RANGE  BVMT-R Total Recall (7+9+11)/36 T=57 High Average  BVMT-R Delayed Recall 11/12 T=61 High Average  BVMT-R Percent Retained 100 >16%ile WNL  BVMT-R Recognition Hits 6 >16%ile WNL  BVMT-R Recognition False Alarms 0 >16%ile WNL  BVMT-R Recognition Discrimination Index 6 >16%ile WNL  QUESTIONNAIRES RAW  RANGE  GDS-SF** 7 -- Mild  GAS-10** 8 -- Mild  ESS 6 -- WNL  *Note: ss = scaled score; StdS = standard score; T = t-score; C/S = corrected raw score; WNL = within normal limits; BNL= below normal limits; D/C = discontinued. Scores from skewed distributions are typically interpreted as WNL (>=16th %ile) or BNL (<16th %ile). **Psychometrist administered geriatric mood questionnaires in error. Results should be interpreted with caution, as the patient's age is younger than the normative sample.   INFORMED CONSENT   Patient was provided with a verbal description of the nature and purpose of the neuropsychological evaluation. Also reviewed were the foreseeable risks and/or discomforts and benefits of the procedure, limits of confidentiality,  and mandatory reporting requirements of this provider. Patient was given the opportunity to have their questions answered. Oral consent to participate was provided by the patient.   This report was prepared as part of a clinical evaluation and is not intended for forensic use.  SERVICE   This evaluation was conducted by Renda Beckwith, Psy.D. In addition to time spent directly with the patient, total professional time (120 minutes) includes record review, integration of relevant medical history, test selection, interpretation of findings, and report preparation. A technician, Lonell Jude, B.S., provided testing and scoring assistance (150 minutes).  Psychiatric Diagnostic Evaluation Services (Professional): 09208 x 1 Neuropsychological Testing Evaluation Services (Professional): 03867 x 1 Neuropsychological Testing Evaluation Services (Professional): 03866 x 1 Neuropsychological Test Administration and Scoring Radiographer, therapeutic): [phone] x 1 Neuropsychological Test Administration and Scoring (Technician): [phone] x 4  This report was generated using voice recognition software. While this document has been carefully reviewed, transcription errors may be present. I apologize in advance for any inconvenience. Please contact me if further clarification is needed.            Renda Beckwith, Psy.D.             Neuropsychologist

## 2024-03-19 ENCOUNTER — Ambulatory Visit: Admitting: Psychology

## 2024-03-19 DIAGNOSIS — R419 Unspecified symptoms and signs involving cognitive functions and awareness: Secondary | ICD-10-CM

## 2024-03-19 NOTE — Progress Notes (Signed)
   NEUROPSYCHOLOGY FEEDBACK SESSION [place]. [facility]  [place] Department of Neurology  Date of Feedback Session: 03/19/2024  REASON FOR REFERRAL   Mionna Advincula is a 58 year old, right-handed, Black female with 14 years of formal education. She was referred for neuropsychological evaluation by her neurologist, Tonita Blanch, D.O., to assess current neurocognitive functioning, document potential cognitive deficits, and assist with treatment planning. This is her first neuropsychological evaluation.  FEEDBACK   Patient completed a comprehensive neuropsychological evaluation on 03/08/2024\. Please refer to that encounter for the full report and recommendations. Briefly, results indicated broadly normal test performance, with a few isolated weaknesses that did not suggest broader impairments within their respective domains. Despite occasional forgetfulness with bills and medications, she continues to maintain functional independence. There is no evidence of a neurocognitive disorder at this time. Subjective cognitive concerns are likely multifactorial, related to normal aging, chronic pain, fatigue, ongoing mood symptoms, sleep disturbance, and possible medication side effects.    Today, the patient was unaccompanied. She was provided verbal feedback regarding the findings and impression during this visit, and her questions were answered. A copy of the report was provided at the conclusion of the visit.  DISPOSITION   No follow-up neuropsychological testing was scheduled at this time. Please feel free to refer the patient for repeated evaluation if she shows a significant change in neurocognitive status.  SERVICE   This feedback session was conducted by Renda Beckwith, Psy.D. One unit of [id number] (35 minutes) was billed for Dr. Beckwith' time spent in preparing, conducting, and documenting the current feedback session.  This report was generated using voice recognition software. While  this document has been carefully reviewed, transcription errors may be present. I apologize in advance for any inconvenience. Please contact me if further clarification is needed.

## 2024-03-20 DIAGNOSIS — M533 Sacrococcygeal disorders, not elsewhere classified: Secondary | ICD-10-CM | POA: Diagnosis not present

## 2024-03-26 DIAGNOSIS — M810 Age-related osteoporosis without current pathological fracture: Secondary | ICD-10-CM | POA: Diagnosis not present

## 2024-03-26 DIAGNOSIS — F330 Major depressive disorder, recurrent, mild: Secondary | ICD-10-CM | POA: Diagnosis not present

## 2024-03-26 DIAGNOSIS — E669 Obesity, unspecified: Secondary | ICD-10-CM | POA: Diagnosis not present

## 2024-03-26 DIAGNOSIS — E785 Hyperlipidemia, unspecified: Secondary | ICD-10-CM | POA: Diagnosis not present

## 2024-03-29 ENCOUNTER — Encounter: Payer: Self-pay | Admitting: *Deleted

## 2024-04-04 DIAGNOSIS — M25531 Pain in right wrist: Secondary | ICD-10-CM | POA: Diagnosis not present

## 2024-04-04 DIAGNOSIS — G5611 Other lesions of median nerve, right upper limb: Secondary | ICD-10-CM | POA: Diagnosis not present

## 2024-04-04 DIAGNOSIS — G5601 Carpal tunnel syndrome, right upper limb: Secondary | ICD-10-CM | POA: Diagnosis not present

## 2024-04-08 DIAGNOSIS — E538 Deficiency of other specified B group vitamins: Secondary | ICD-10-CM | POA: Diagnosis not present

## 2024-04-12 DIAGNOSIS — R413 Other amnesia: Secondary | ICD-10-CM | POA: Diagnosis not present

## 2024-04-12 DIAGNOSIS — M5030 Other cervical disc degeneration, unspecified cervical region: Secondary | ICD-10-CM | POA: Diagnosis not present

## 2024-04-12 DIAGNOSIS — M810 Age-related osteoporosis without current pathological fracture: Secondary | ICD-10-CM | POA: Diagnosis not present

## 2024-04-12 DIAGNOSIS — Z0000 Encounter for general adult medical examination without abnormal findings: Secondary | ICD-10-CM | POA: Diagnosis not present

## 2024-04-12 DIAGNOSIS — F330 Major depressive disorder, recurrent, mild: Secondary | ICD-10-CM | POA: Diagnosis not present

## 2024-04-12 DIAGNOSIS — Z23 Encounter for immunization: Secondary | ICD-10-CM | POA: Diagnosis not present

## 2024-04-12 DIAGNOSIS — D509 Iron deficiency anemia, unspecified: Secondary | ICD-10-CM | POA: Diagnosis not present

## 2024-04-12 DIAGNOSIS — K589 Irritable bowel syndrome without diarrhea: Secondary | ICD-10-CM | POA: Diagnosis not present

## 2024-04-12 DIAGNOSIS — I10 Essential (primary) hypertension: Secondary | ICD-10-CM | POA: Diagnosis not present

## 2024-04-12 DIAGNOSIS — E559 Vitamin D deficiency, unspecified: Secondary | ICD-10-CM | POA: Diagnosis not present

## 2024-04-12 DIAGNOSIS — M51362 Other intervertebral disc degeneration, lumbar region with discogenic back pain and lower extremity pain: Secondary | ICD-10-CM | POA: Diagnosis not present

## 2024-04-17 DIAGNOSIS — G8929 Other chronic pain: Secondary | ICD-10-CM | POA: Diagnosis not present

## 2024-04-17 DIAGNOSIS — S83241A Other tear of medial meniscus, current injury, right knee, initial encounter: Secondary | ICD-10-CM | POA: Diagnosis not present

## 2024-04-17 DIAGNOSIS — M1711 Unilateral primary osteoarthritis, right knee: Secondary | ICD-10-CM | POA: Diagnosis not present

## 2024-04-17 DIAGNOSIS — M25561 Pain in right knee: Secondary | ICD-10-CM | POA: Diagnosis not present

## 2024-04-23 DIAGNOSIS — G8929 Other chronic pain: Secondary | ICD-10-CM | POA: Diagnosis not present

## 2024-04-23 DIAGNOSIS — M961 Postlaminectomy syndrome, not elsewhere classified: Secondary | ICD-10-CM | POA: Diagnosis not present

## 2024-04-23 DIAGNOSIS — M545 Low back pain, unspecified: Secondary | ICD-10-CM | POA: Diagnosis not present

## 2024-04-25 ENCOUNTER — Other Ambulatory Visit: Payer: Self-pay | Admitting: Internal Medicine

## 2024-04-26 DIAGNOSIS — E785 Hyperlipidemia, unspecified: Secondary | ICD-10-CM | POA: Diagnosis not present

## 2024-04-26 DIAGNOSIS — M810 Age-related osteoporosis without current pathological fracture: Secondary | ICD-10-CM | POA: Diagnosis not present

## 2024-04-26 DIAGNOSIS — F330 Major depressive disorder, recurrent, mild: Secondary | ICD-10-CM | POA: Diagnosis not present

## 2024-04-26 DIAGNOSIS — M25561 Pain in right knee: Secondary | ICD-10-CM | POA: Diagnosis not present

## 2024-04-26 DIAGNOSIS — E669 Obesity, unspecified: Secondary | ICD-10-CM | POA: Diagnosis not present

## 2024-05-03 ENCOUNTER — Other Ambulatory Visit: Payer: Self-pay | Admitting: Internal Medicine

## 2024-05-08 DIAGNOSIS — M25561 Pain in right knee: Secondary | ICD-10-CM | POA: Diagnosis not present

## 2024-05-08 DIAGNOSIS — M1711 Unilateral primary osteoarthritis, right knee: Secondary | ICD-10-CM | POA: Diagnosis not present

## 2024-05-10 DIAGNOSIS — E538 Deficiency of other specified B group vitamins: Secondary | ICD-10-CM | POA: Diagnosis not present

## 2024-05-26 DIAGNOSIS — M810 Age-related osteoporosis without current pathological fracture: Secondary | ICD-10-CM | POA: Diagnosis not present

## 2024-05-26 DIAGNOSIS — E785 Hyperlipidemia, unspecified: Secondary | ICD-10-CM | POA: Diagnosis not present

## 2024-05-26 DIAGNOSIS — E669 Obesity, unspecified: Secondary | ICD-10-CM | POA: Diagnosis not present

## 2024-05-26 DIAGNOSIS — F330 Major depressive disorder, recurrent, mild: Secondary | ICD-10-CM | POA: Diagnosis not present

## 2024-07-31 ENCOUNTER — Other Ambulatory Visit: Payer: Self-pay | Admitting: Physician Assistant

## 2024-08-01 ENCOUNTER — Other Ambulatory Visit: Payer: Self-pay | Admitting: Cardiology

## 2024-08-01 DIAGNOSIS — I10 Essential (primary) hypertension: Secondary | ICD-10-CM
